# Patient Record
Sex: Female | Born: 1950 | Race: Black or African American | Hispanic: No | Marital: Single | Smoking: Never smoker
Health system: Southern US, Community
[De-identification: ages and names within clinical notes are randomized; demographics above are authoritative.]

## PROBLEM LIST (undated history)

## (undated) DIAGNOSIS — R011 Cardiac murmur, unspecified: Secondary | ICD-10-CM

## (undated) DIAGNOSIS — M1990 Unspecified osteoarthritis, unspecified site: Secondary | ICD-10-CM

## (undated) DIAGNOSIS — E785 Hyperlipidemia, unspecified: Secondary | ICD-10-CM

## (undated) DIAGNOSIS — K219 Gastro-esophageal reflux disease without esophagitis: Secondary | ICD-10-CM

## (undated) DIAGNOSIS — I10 Essential (primary) hypertension: Secondary | ICD-10-CM

## (undated) DIAGNOSIS — G959 Disease of spinal cord, unspecified: Secondary | ICD-10-CM

## (undated) DIAGNOSIS — E119 Type 2 diabetes mellitus without complications: Secondary | ICD-10-CM

## (undated) DIAGNOSIS — G4730 Sleep apnea, unspecified: Secondary | ICD-10-CM

## (undated) DIAGNOSIS — Z87442 Personal history of urinary calculi: Secondary | ICD-10-CM

## (undated) HISTORY — DX: Hyperlipidemia, unspecified: E78.5

## (undated) HISTORY — PX: CERVICAL CONE BIOPSY: SUR198

## (undated) HISTORY — DX: Essential (primary) hypertension: I10

## (undated) HISTORY — DX: Disease of spinal cord, unspecified: G95.9

## (undated) HISTORY — DX: Sleep apnea, unspecified: G47.30

## (undated) HISTORY — DX: Type 2 diabetes mellitus without complications: E11.9

---

## 2005-10-05 ENCOUNTER — Ambulatory Visit: Payer: Self-pay | Admitting: Cardiology

## 2005-10-25 ENCOUNTER — Ambulatory Visit: Payer: Self-pay | Admitting: Cardiology

## 2010-05-08 DIAGNOSIS — R072 Precordial pain: Secondary | ICD-10-CM

## 2010-09-30 ENCOUNTER — Other Ambulatory Visit: Payer: Self-pay | Admitting: Neurology

## 2010-09-30 DIAGNOSIS — M542 Cervicalgia: Secondary | ICD-10-CM

## 2010-10-07 ENCOUNTER — Ambulatory Visit (HOSPITAL_COMMUNITY)
Admission: RE | Admit: 2010-10-07 | Discharge: 2010-10-07 | Disposition: A | Discharge status: Home / Self Care | Payer: BC Managed Care – PPO | Source: Ambulatory Visit | Attending: Neurology | Admitting: Neurology

## 2010-10-07 DIAGNOSIS — M542 Cervicalgia: Secondary | ICD-10-CM

## 2010-10-07 DIAGNOSIS — M6281 Muscle weakness (generalized): Secondary | ICD-10-CM | POA: Insufficient documentation

## 2010-10-07 DIAGNOSIS — R209 Unspecified disturbances of skin sensation: Secondary | ICD-10-CM | POA: Insufficient documentation

## 2010-10-07 DIAGNOSIS — M5020 Other cervical disc displacement, unspecified cervical region: Secondary | ICD-10-CM | POA: Insufficient documentation

## 2013-12-19 ENCOUNTER — Encounter: Payer: BC Managed Care – PPO | Attending: "Endocrinology | Admitting: Nutrition

## 2013-12-19 ENCOUNTER — Encounter: Payer: Self-pay | Admitting: Nutrition

## 2013-12-19 VITALS — Ht 64.0 in | Wt 211.4 lb

## 2013-12-19 DIAGNOSIS — E1165 Type 2 diabetes mellitus with hyperglycemia: Secondary | ICD-10-CM

## 2013-12-19 DIAGNOSIS — E118 Type 2 diabetes mellitus with unspecified complications: Principal | ICD-10-CM

## 2013-12-19 DIAGNOSIS — IMO0002 Reserved for concepts with insufficient information to code with codable children: Secondary | ICD-10-CM

## 2013-12-19 NOTE — Progress Notes (Signed)
  Medical Nutrition Therapy:  Appt start time: 2542 end time:  1630.   Assessment:  Primary concerns today: DIabetes Type 2.. LIves by herself. 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 Doesn't eat but 1 meal per day usually. Doesn't exercise. On 30 units of Levemir and 100 mg of Invocana. Was on Metformin at one time but quit it due to GI upset. Willing to retry it now that she has been educated on taking it with food or after meal to reduce any GI side effect. Gets tired and doesn't have time to eat at times. A1C 7.9%.  Preferred Learning Style:    No preference indicated   Learning Readiness:    Ready  Change in progress   MEDICATIONs. See LIst   DIETARY INTAKE:  24-hr recall:  B ( AM): skips OR Sausage, scrambled egg biscuit Snk ( AM): none  L ( PM): chips, or soda Snk ( PM): none D ( PM): Usually stops at the [employer] and gets a meat and some vegetables/starches.. 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  Estimated energy needs: 1500 calories 170 g carbohydrates 112 g protein 42 g fat  Progress Towards Goal(s):  In progress.   Nutritional Diagnosis: Food and nutrition knowledge deficit related to diabetes as evidenced by A1C 7.9%    Intervention: INutrition counseling and diabetes education provided. Plan:  Aim for 2-3 Carb Choices per meal (30-45 grams) +/- 1 either way  No snacks between meals Cut out sodas Eat three balanced meals per day. Plan meals ahead and take 'plates' to work Include protein in moderation with your meals Consider  increasing your activity level by 30 minutes daily as tolerated Consider checking BG at fasting and 2 hours after supper daily and record on BS log.  Consider taking medication Levemir 30 units daily and Invokana as directed by MD Goal: Get A1C down below 7% in three months. 2. Do not skip meals.3. Lose  1 lb per week. 4. Cut out sodas.  Teaching Method Utilized:  Visual Auditory Hands on  Handouts given during visit include: The Plate Method The Meal Plan Card       Barriers to learning/adherence to lifestyle change: none  Demonstrated degree of understanding via:  Teach Back   Monitoring/Evaluation:  Dietary intake, exercise, meal planning, and body weight in 1 month(s).

## 2013-12-19 NOTE — Patient Instructions (Signed)
Plan:  Aim for 2-3 Carb Choices per meal (30-45 grams) +/- 1 either way  No snacks between meals Cut out sodas Eat three balanced meals per day. Plan meals ahead and take 'plates' to work Include protein in moderation with your meals Consider  increasing your activity level by 30 minutes daily as tolerated Consider checking BG at fasting and 2 hours after supper daily and record on BS log.  Consider taking medication Levemir 30 units daily and Invokana as directed by MD Goal: Get A1C down below 7% in three months. 2. Do not skip meals.3. Lose 1 lb per week. 4. Cut out sodas.

## 2014-02-04 ENCOUNTER — Encounter: Payer: Self-pay | Attending: "Endocrinology | Admitting: Nutrition

## 2014-02-04 VITALS — Ht 64.0 in | Wt 206.0 lb

## 2014-02-04 DIAGNOSIS — E118 Type 2 diabetes mellitus with unspecified complications: Principal | ICD-10-CM

## 2014-02-04 DIAGNOSIS — E1165 Type 2 diabetes mellitus with hyperglycemia: Secondary | ICD-10-CM

## 2014-02-04 DIAGNOSIS — IMO0002 Reserved for concepts with insufficient information to code with codable children: Secondary | ICD-10-CM

## 2014-02-04 NOTE — Progress Notes (Signed)
  Medical Nutrition Therapy:  Appt start time: 1600 end time:  1630.  Assessment:  Primary concerns today: DIabetes Type 2..Follow up. Taking levemir 30 units and Crestor at night. Lost 5 lbs  Since last visit. Working on trying to eat better balanced meals. Didn't write down blood sugars. BS at night was 110 mg/dl FBS 165 mg/dl.   Preferred Learning Style:    No preference indicated   Learning Readiness:    Ready  Change in progress  MEDICATIONs. 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  Estimated energy needs: 1500 calories 170 g carbohydrates 112 g protein 42 g fat  Progress Towards Goal(s):  In progress.   Nutritional Diagnosis: Food and nutrition knowledge deficit related to diabetes as evidenced by A1C 7.9%    Intervention: Nutrition counseling and diabetes education provided. Reviewed meal planning, target ranges for blood sugars and benefits of weight loss on insulin resistance.  Plan:  Aim for 2-3 Carb Choices per meal (30-45 grams) +/- 1 either way  No snacks between meals Cut out sodas Eat three balanced meals per day. Plan meals ahead and take 'plates' to work Include protein in moderation with your meals Consider  increasing your activity level by 30 minutes daily as tolerated Keep a food journal Consider checking BG at fasting and 2 hours after supper daily and record on BS log.  Increase physical activity to 30 minutes a day. Goal: Get A1C down below 7% in three months. 2. Do not skip meals.3. Lose 1 lb per week. 4. Cut out sodas.  Teaching Method Utilized:  Visual Auditory Hands on  Handouts given during visit include: The Plate Method The  Meal Plan Card       Barriers to learning/adherence to lifestyle change: none  Demonstrated degree of understanding via:  Teach Back   Monitoring/Evaluation:  Dietary intake, exercise, meal planning, and body weight in 1 month(s).

## 2014-02-04 NOTE — Patient Instructions (Signed)
Plan:  Aim for 2-3 Carb Choices per meal (30-45 grams) +/- 1 either way  No snacks between meals Cut out sodas Eat three balanced meals per day. Plan meals ahead and take 'plates' to work Include protein in moderation with your meals Consider  increasing your activity level by 30 minutes daily as tolerated Keep a food journal Consider checking BG at fasting and 2 hours after supper daily and record on BS log.  Increase physical activity to 30 minutes a day. Goal: Get A1C down below 7% in three months. 2. Do not skip meals.3. Lose 1 lb per week. 4. Use resistance band for 5 minutes a day and walk in place while watching commercials. Marland Kitchen

## 2014-03-11 ENCOUNTER — Encounter: Payer: Self-pay | Attending: "Endocrinology | Admitting: Nutrition

## 2014-07-05 ENCOUNTER — Ambulatory Visit (INDEPENDENT_AMBULATORY_CARE_PROVIDER_SITE_OTHER): Payer: BLUE CROSS/BLUE SHIELD | Admitting: Urology

## 2014-07-05 DIAGNOSIS — N3941 Urge incontinence: Secondary | ICD-10-CM

## 2014-07-05 DIAGNOSIS — N200 Calculus of kidney: Secondary | ICD-10-CM

## 2014-07-17 LAB — HEMOGLOBIN A1C: Hgb A1c MFr Bld: 8.1 % — AB (ref 4.0–6.0)

## 2014-09-13 ENCOUNTER — Ambulatory Visit (INDEPENDENT_AMBULATORY_CARE_PROVIDER_SITE_OTHER): Payer: BLUE CROSS/BLUE SHIELD | Admitting: Urology

## 2014-09-13 DIAGNOSIS — R3915 Urgency of urination: Secondary | ICD-10-CM | POA: Diagnosis not present

## 2014-09-13 DIAGNOSIS — N200 Calculus of kidney: Secondary | ICD-10-CM

## 2014-09-13 DIAGNOSIS — N3941 Urge incontinence: Secondary | ICD-10-CM | POA: Diagnosis not present

## 2014-10-11 ENCOUNTER — Encounter: Payer: Self-pay | Admitting: "Endocrinology

## 2014-10-25 ENCOUNTER — Ambulatory Visit: Payer: Self-pay | Admitting: "Endocrinology

## 2014-10-26 ENCOUNTER — Other Ambulatory Visit: Payer: Self-pay | Admitting: "Endocrinology

## 2014-10-28 ENCOUNTER — Ambulatory Visit (INDEPENDENT_AMBULATORY_CARE_PROVIDER_SITE_OTHER): Payer: BLUE CROSS/BLUE SHIELD | Admitting: "Endocrinology

## 2014-10-28 ENCOUNTER — Encounter: Payer: Self-pay | Admitting: "Endocrinology

## 2014-10-28 VITALS — BP 115/76 | HR 81 | Ht 64.0 in | Wt 200.0 lb

## 2014-10-28 DIAGNOSIS — E785 Hyperlipidemia, unspecified: Secondary | ICD-10-CM

## 2014-10-28 DIAGNOSIS — I10 Essential (primary) hypertension: Secondary | ICD-10-CM | POA: Insufficient documentation

## 2014-10-28 DIAGNOSIS — Z794 Long term (current) use of insulin: Secondary | ICD-10-CM

## 2014-10-28 DIAGNOSIS — E1165 Type 2 diabetes mellitus with hyperglycemia: Secondary | ICD-10-CM

## 2014-10-28 DIAGNOSIS — E782 Mixed hyperlipidemia: Secondary | ICD-10-CM | POA: Insufficient documentation

## 2014-10-28 DIAGNOSIS — IMO0001 Reserved for inherently not codable concepts without codable children: Secondary | ICD-10-CM

## 2014-10-28 MED ORDER — CANAGLIFLOZIN 100 MG PO TABS: 100.0000 mg | ORAL_TABLET | Freq: Every day | ORAL | Status: DC

## 2014-10-28 NOTE — Progress Notes (Signed)
Subjective:    Patient ID: Rebecca Roberson, female    DOB: [birth year],    Past Medical History  Diagnosis Date  . Diabetes mellitus without complication ([place])   . Hypertension   . Hyperlipidemia   . Sleep apnea    Past Surgical History  Procedure Laterality Date  . Cervical cone biopsy     Social History   Social History  . Marital Status: Single    Spouse Name: N/A  . Number of Children: N/A  . Years of Education: N/A   Social History Main Topics  . Smoking status: Never Smoker   . Smokeless tobacco: None  . Alcohol Use: No  . Drug Use: No  . Sexual Activity: Not Asked   Other Topics Concern  . None   Social History Narrative   Outpatient Encounter Prescriptions as of 10/28/2014  Medication Sig  . canagliflozin (INVOKANA) 100 MG TABS tablet Take 1 tablet (100 mg total) by mouth daily.  . cyclobenzaprine (FLEXERIL) 10 MG tablet Take 10 mg by mouth 3 (three) times daily as needed for muscle spasms.  Marland Kitchen gabapentin (NEURONTIN) 300 MG capsule Take 300 mg by mouth 3 (three) times daily.  Marland Kitchen ibuprofen (ADVIL,MOTRIN) 800 MG tablet Take 800 mg by mouth every 8 (eight) hours as needed.  . Insulin Detemir (LEVEMIR FLEXPEN [place]) Inject 30 Units into the skin.  Marland Kitchen lisinopril-hydrochlorothiazide (PRINZIDE,ZESTORETIC) 20-25 MG per tablet Take 1 tablet by mouth daily.  Marland Kitchen omeprazole (PRILOSEC) 20 MG capsule Take 20 mg by mouth daily.  Marland Kitchen PARoxetine (PAXIL) 20 MG tablet Take 20 mg by mouth daily.  . potassium chloride (KLOR-CON) 20 MEQ packet Take by mouth 2 (two) times daily.  . rosuvastatin (CRESTOR) 10 MG tablet Take 10 mg by mouth daily.  Marland Kitchen tolterodine (DETROL LA) 4 MG 24 hr capsule Take 4 mg by mouth daily.  . traMADol (ULTRAM) 50 MG tablet Take by mouth every 6 (six) hours as needed.  . Vitamin D, Ergocalciferol, (DRISDOL) 50000 UNITS CAPS capsule Take 50,000 Units by mouth every 7 (seven) days.  . [DISCONTINUED] INVOKANA 100 MG TABS tablet TAKE ONE TABLET BY MOUTH DAILY   No  facility-administered encounter medications on file as of 10/28/2014.   ALLERGIES: Allergies  Allergen Reactions  . Sulfa Antibiotics    VACCINATION STATUS:  There is no immunization history on file for this patient.  Diabetes She presents for her follow-up diabetic visit. She has type 2 diabetes mellitus. Onset time: Diagnosed at approximate age of 77 yrs. Her disease course has been stable. There are no hypoglycemic associated symptoms. Pertinent negatives for hypoglycemia include no confusion, headaches, pallor or seizures. There are no diabetic associated symptoms. Pertinent negatives for diabetes include no chest pain and no polyuria. Symptoms are improving. There are no diabetic complications. Risk factors for coronary artery disease include dyslipidemia, hypertension and sedentary lifestyle. Her weight is stable. Diabetic meal planning: Admits to dietary indiscretion. She drinks soda regularly. Her overall blood glucose range is 180-200 mg/dl.  Hypertension This is a chronic problem. The current episode started more than 1 year ago. Pertinent negatives include no chest pain, headaches, palpitations or shortness of breath.  Hyperlipidemia This is a chronic problem. The current episode started more than 1 year ago. Pertinent negatives include no chest pain, myalgias or shortness of breath.     Review of Systems  Constitutional: Negative for unexpected weight change.  HENT: Negative for trouble swallowing and voice change.   Eyes: Negative for visual disturbance.  Respiratory: Negative for cough, shortness of breath and wheezing.   Cardiovascular: Negative for chest pain, palpitations and leg swelling.  Gastrointestinal: Negative for nausea, vomiting and diarrhea.  Endocrine: Negative for cold intolerance, heat intolerance and polyuria.  Musculoskeletal: Negative for myalgias and arthralgias.  Skin: Negative for color change, pallor, rash and wound.  Neurological: Negative for  seizures and headaches.  Psychiatric/Behavioral: Negative for suicidal ideas and confusion.    Objective:    BP 115/76 mmHg  Pulse 81  Ht 5\' 4"  (1.626 m)  Wt 200 lb (90.719 kg)  BMI 34.31 kg/m2  SpO2 95%  Wt Readings from Last 3 Encounters:  10/28/14 200 lb (90.719 kg)  10/11/14 200 lb (90.719 kg)  02/04/14 206 lb (93.441 kg)    Physical Exam  Constitutional: She is oriented to person, place, and time. She appears well-developed.  HENT:  Head: Normocephalic and atraumatic.  Eyes: EOM are normal.  Neck: Normal range of motion. Neck supple. No tracheal deviation present. No thyromegaly present.  Cardiovascular: Normal rate and regular rhythm.   Pulmonary/Chest: Effort normal and breath sounds normal.  Abdominal: Soft. Bowel sounds are normal. There is no tenderness. There is no guarding.  Musculoskeletal: Normal range of motion. She exhibits no edema.  Neurological: She is alert and oriented to person, place, and time. She has normal reflexes. No cranial nerve deficit. Coordination normal.  Skin: Skin is warm and dry. No rash noted. No erythema. No pallor.  Psychiatric: She has a normal mood and affect. Judgment normal.    Results for orders placed or performed in visit on 10/11/14  Hemoglobin A1c  Result Value Ref Range   Hgb A1c MFr Bld 8.1 (A) 4.0 - 6.0 %   Complete Blood Count (Most recent): No results found for: WBC, HGB, HCT, MCV, PLT Chemistry (most recent): No results found for: NA, K, CL, CO2, BUN, CREATININE, GLUF Diabetic Labs (most recent): Lab Results  Component Value Date   HGBA1C 8.1* 07/17/2014   Lipid profile (most recent): No results found for: TRIG, CHOL       Assessment & Plan:   1. Uncontrolled type 2 diabetes mellitus without complication, with long-term current use of insulin ([place])  Patient came with stable mood but above target glucose profile, and  recent A1c of 8 %. Recent labs reviewed. Patient remains at a high risk for more acute and  chronic complications of diabetes which include CAD, CVA, CKD, retinopathy, and neuropathy. These are all discussed in detail with the patient. I have re-counseled the patient on diet management and weight loss, by adopting a carbohydrate restricted / protein rich  Diet. Patient is advised to stick to a routine mealtimes to eat 3 meals  a day and avoid unnecessary snacks ( to snack only to correct hypoglycemia). Patient is advised to eliminate simple carbs  from their diet including cakes desserts ice cream soda (  diet and regular) , sweet tea , Candies,  chips and cookies, artificial sweeteners,   and "sugar-free" products .  This will help patient to have stable blood glucose profile and potentially lose weight.  The patient  will be  scheduled with Jearld Fenton, RDN, CDE for individualized DM education. I have approached patient to continue on  intensive monitoring of blood glucose and insulin therapy, and patient agrees.  I will increase her basal insulin Levemir to 40 units QHS, associated with strict monitoring of glucose  every morning. - Patient is warned not to take insulin without proper monitoring  per orders.  -Patient is encouraged to call clinic for blood glucose levels less than 70 or above 300 mg /dl. -She is known to have intolerance to metformin -I advised her to continue interval, 100 mg by mouth every day. Side effects and precautions discussed with her. -Target numbers for A1c, LDL, HDL, Triglycerides, Waist Circumference were discussed in detail.  2) HTN: Controlled. Continue current medications including ACEI lisinopril 20 mg.Marland Kitchen 3) HPL: LDL at above target levels, continue statins Crestor 21 mg.. 4)  Weight/Diet: CDE consult, exercise, and carbs information provided.  5) Chronic Care: -Patient is on ACEI and Statin medications and encouraged to continue to follow up with Ophthalmology, Podiatrist at least yearly or according to recommendations, and advised to stay away  from smoking. I have recommended yearly flu vaccine and pneumonia vaccination at least every 5 years; moderate intensity exercise for up to 150 minutes weekly; and  sleep for at least 7 hours a day.  Patient to bring meter and  blood glucose logs during their next visit.  Follow up plan: Return in about 3 months (around 01/28/2015) for diabetes, high blood pressure, high cholesterol, follow up with meter and logs- no labs.  Glade Lloyd, MD Phone: [phone]  Fax: [phone]   10/28/2014, 4:54 PM

## 2014-11-07 ENCOUNTER — Other Ambulatory Visit: Payer: Self-pay | Admitting: "Endocrinology

## 2014-11-23 ENCOUNTER — Other Ambulatory Visit: Payer: Self-pay | Admitting: "Endocrinology

## 2015-01-21 ENCOUNTER — Other Ambulatory Visit: Payer: Self-pay | Admitting: "Endocrinology

## 2015-02-10 ENCOUNTER — Ambulatory Visit: Payer: BLUE CROSS/BLUE SHIELD | Admitting: "Endocrinology

## 2015-04-10 ENCOUNTER — Other Ambulatory Visit: Payer: Self-pay | Admitting: "Endocrinology

## 2015-04-11 LAB — BASIC METABOLIC PANEL
BUN: 18 mg/dL (ref 7–25)
CALCIUM: 8.9 mg/dL (ref 8.6–10.4)
CHLORIDE: 101 mmol/L (ref 98–110)
CO2: 28 mmol/L (ref 20–31)
Creat: 0.61 mg/dL (ref 0.50–0.99)
GLUCOSE: 147 mg/dL — AB (ref 65–99)
POTASSIUM: 3.5 mmol/L (ref 3.5–5.3)
SODIUM: 138 mmol/L (ref 135–146)

## 2015-04-11 LAB — HEMOGLOBIN A1C
Hgb A1c MFr Bld: 8.2 % — ABNORMAL HIGH (ref <5.7)
Mean Plasma Glucose: 189 mg/dL — ABNORMAL HIGH (ref <117)

## 2015-04-17 ENCOUNTER — Other Ambulatory Visit: Payer: Self-pay | Admitting: "Endocrinology

## 2015-04-17 ENCOUNTER — Encounter: Payer: Self-pay | Admitting: "Endocrinology

## 2015-04-17 ENCOUNTER — Ambulatory Visit (INDEPENDENT_AMBULATORY_CARE_PROVIDER_SITE_OTHER): Payer: BLUE CROSS/BLUE SHIELD | Admitting: "Endocrinology

## 2015-04-17 ENCOUNTER — Other Ambulatory Visit: Payer: Self-pay

## 2015-04-17 VITALS — BP 118/80 | HR 84 | Resp 18 | Ht 64.0 in | Wt 200.0 lb

## 2015-04-17 DIAGNOSIS — Z794 Long term (current) use of insulin: Secondary | ICD-10-CM

## 2015-04-17 DIAGNOSIS — E1165 Type 2 diabetes mellitus with hyperglycemia: Secondary | ICD-10-CM

## 2015-04-17 DIAGNOSIS — E559 Vitamin D deficiency, unspecified: Secondary | ICD-10-CM | POA: Diagnosis not present

## 2015-04-17 DIAGNOSIS — E785 Hyperlipidemia, unspecified: Secondary | ICD-10-CM

## 2015-04-17 DIAGNOSIS — Z6834 Body mass index (BMI) 34.0-34.9, adult: Secondary | ICD-10-CM

## 2015-04-17 DIAGNOSIS — I10 Essential (primary) hypertension: Secondary | ICD-10-CM

## 2015-04-17 DIAGNOSIS — IMO0001 Reserved for inherently not codable concepts without codable children: Secondary | ICD-10-CM

## 2015-04-17 DIAGNOSIS — E6609 Other obesity due to excess calories: Secondary | ICD-10-CM | POA: Insufficient documentation

## 2015-04-17 MED ORDER — GLUCOSE BLOOD VI STRP
ORAL_STRIP | Status: DC
Start: 2015-04-17 — End: 2020-09-24

## 2015-04-17 NOTE — Patient Instructions (Signed)

## 2015-04-17 NOTE — Progress Notes (Signed)
Subjective:    Patient ID: Rebecca Roberson, female    DOB: Nov 13, 1950,    Past Medical History  Diagnosis Date  . Diabetes mellitus without complication ([place])   . Hypertension   . Hyperlipidemia   . Sleep apnea    Past Surgical History  Procedure Laterality Date  . Cervical cone biopsy     Social History   Social History  . Marital Status: Single    Spouse Name: N/A  . Number of Children: N/A  . Years of Education: N/A   Social History Main Topics  . Smoking status: Never Smoker   . Smokeless tobacco: None  . Alcohol Use: No  . Drug Use: No  . Sexual Activity: Not Asked   Other Topics Concern  . None   Social History Narrative   Outpatient Encounter Prescriptions as of 04/17/2015  Medication Sig  . cyclobenzaprine (FLEXERIL) 10 MG tablet Take 10 mg by mouth 3 (three) times daily as needed for muscle spasms.  Marland Kitchen gabapentin (NEURONTIN) 300 MG capsule Take 300 mg by mouth 3 (three) times daily.  Marland Kitchen ibuprofen (ADVIL,MOTRIN) 800 MG tablet Take 800 mg by mouth every 8 (eight) hours as needed.  . Insulin Detemir (LEVEMIR FLEXPEN [place]) Inject 40 Units into the skin.  . INVOKANA 100 MG TABS tablet TAKE ONE TABLET BY MOUTH DAILY  . lisinopril-hydrochlorothiazide (PRINZIDE,ZESTORETIC) 20-25 MG per tablet Take 1 tablet by mouth daily.  Marland Kitchen LITETOUCH PEN NEEDLES 31G X 8 MM MISC USE FOUR TIMES DAILY  . omeprazole (PRILOSEC) 20 MG capsule Take 20 mg by mouth daily.  Marland Kitchen PARoxetine (PAXIL) 20 MG tablet Take 20 mg by mouth daily.  . potassium chloride (KLOR-CON) 20 MEQ packet Take by mouth 2 (two) times daily.  . rosuvastatin (CRESTOR) 10 MG tablet TAKE ONE TABLET BY MOUTH ONCE DAILY  . tolterodine (DETROL LA) 4 MG 24 hr capsule Take 4 mg by mouth daily.  . traMADol (ULTRAM) 50 MG tablet Take by mouth every 6 (six) hours as needed.  . Vitamin D, Ergocalciferol, (DRISDOL) 50000 UNITS CAPS capsule Take 50,000 Units by mouth every 7 (seven) days.   No facility-administered encounter  medications on file as of 04/17/2015.   ALLERGIES: Allergies  Allergen Reactions  . Sulfa Antibiotics    VACCINATION STATUS:  There is no immunization history on file for this patient.  Diabetes She presents for her follow-up diabetic visit. She has type 2 diabetes mellitus. Onset time: Diagnosed at approximate age of 39 yrs. Her disease course has been stable. There are no hypoglycemic associated symptoms. Pertinent negatives for hypoglycemia include no confusion, headaches, pallor or seizures. There are no diabetic associated symptoms. Pertinent negatives for diabetes include no chest pain and no polyuria. Symptoms are stable. There are no diabetic complications. Risk factors for coronary artery disease include dyslipidemia, hypertension and sedentary lifestyle. Her weight is stable. Diabetic meal planning: Admits to dietary indiscretion. She drinks soda regularly. Home blood sugar record trend: She did not bring the meter nor log to review today. Her overall blood glucose range is 180-200 mg/dl.  Hypertension This is a chronic problem. The current episode started more than 1 year ago. Pertinent negatives include no chest pain, headaches, palpitations or shortness of breath.  Hyperlipidemia This is a chronic problem. The current episode started more than 1 year ago. Pertinent negatives include no chest pain, myalgias or shortness of breath.     Review of Systems  Constitutional: Negative for unexpected weight change.  HENT: Negative  for trouble swallowing and voice change.   Eyes: Negative for visual disturbance.  Respiratory: Negative for cough, shortness of breath and wheezing.   Cardiovascular: Negative for chest pain, palpitations and leg swelling.  Gastrointestinal: Negative for nausea, vomiting and diarrhea.  Endocrine: Negative for cold intolerance, heat intolerance and polyuria.  Musculoskeletal: Negative for myalgias and arthralgias.  Skin: Negative for color change, pallor,  rash and wound.  Neurological: Negative for seizures and headaches.  Psychiatric/Behavioral: Negative for suicidal ideas and confusion.    Objective:    BP 118/80 mmHg  Pulse 84  Resp 18  Ht 5\' 4"  (1.626 m)  Wt 200 lb (90.719 kg)  BMI 34.31 kg/m2  SpO2 97%  Wt Readings from Last 3 Encounters:  04/17/15 200 lb (90.719 kg)  10/28/14 200 lb (90.719 kg)  10/11/14 200 lb (90.719 kg)    Physical Exam  Constitutional: She is oriented to person, place, and time. She appears well-developed.  HENT:  Head: Normocephalic and atraumatic.  Eyes: EOM are normal.  Neck: Normal range of motion. Neck supple. No tracheal deviation present. No thyromegaly present.  Cardiovascular: Normal rate and regular rhythm.   Pulmonary/Chest: Effort normal and breath sounds normal.  Abdominal: Soft. Bowel sounds are normal. There is no tenderness. There is no guarding.  Musculoskeletal: Normal range of motion. She exhibits no edema.  Neurological: She is alert and oriented to person, place, and time. She has normal reflexes. No cranial nerve deficit. Coordination normal.  Skin: Skin is warm and dry. No rash noted. No erythema. No pallor.  Psychiatric: She has a normal mood and affect. Judgment normal.    Results for orders placed or performed in visit on [id number]  Basic metabolic panel  Result Value Ref Range   Sodium 138 135 - 146 mmol/L   Potassium 3.5 3.5 - 5.3 mmol/L   Chloride 101 98 - 110 mmol/L   CO2 28 20 - 31 mmol/L   Glucose, Bld 147 (H) 65 - 99 mg/dL   BUN 18 7 - 25 mg/dL   Creat 0.61 0.50 - 0.99 mg/dL   Calcium 8.9 8.6 - 10.4 mg/dL  Hemoglobin A1c  Result Value Ref Range   Hgb A1c MFr Bld 8.2 (H) <5.7 %   Mean Plasma Glucose 189 (H) <117 mg/dL   Complete Blood Count (Most recent): No results found for: WBC, HGB, HCT, MCV, PLT Chemistry (most recent): Lab Results  Component Value Date   NA 138 04/10/2015   K 3.5 04/10/2015   CL 101 04/10/2015   CO2 28 04/10/2015   BUN 18  04/10/2015   CREATININE 0.61 04/10/2015   Diabetic Labs (most recent): Lab Results  Component Value Date   HGBA1C 8.2* 04/10/2015   HGBA1C 8.1* 07/17/2014   Lipid profile (most recent): No results found for: TRIG, CHOL       Assessment & Plan:   1. Uncontrolled type 2 diabetes mellitus without complication, with long-term current use of insulin ([place])  Patient came with stable mood but above target glucose profile, and  recent A1c  stays the same at 8.2%.  Recent labs reviewed. Patient remains at a high risk for more acute and chronic complications of diabetes which include CAD, CVA, CKD, retinopathy, and neuropathy. These are all discussed in detail with the patient. I have re-counseled the patient on diet management and weight loss, by adopting a carbohydrate restricted / protein rich  Diet. Patient is advised to stick to a routine mealtimes to eat 3 meals  a day and avoid unnecessary snacks ( to snack only to correct hypoglycemia). Patient is advised to eliminate simple carbs  from their diet including cakes desserts ice cream soda (  diet and regular) , sweet tea , Candies,  chips and cookies, artificial sweeteners,   and "sugar-free" products .  This will help patient to have stable blood glucose profile and potentially lose weight.  The patient  will be  scheduled with Jearld Fenton, RDN, CDE for individualized DM education. I have approached patient to continue on  intensive monitoring of blood glucose and insulin therapy, and patient agrees.  I will increase her basal insulin Levemir to 40 units QHS, associated with strict monitoring of glucose  every morning. - Patient is warned not to take insulin without proper monitoring per orders.  -Patient is encouraged to call clinic for blood glucose levels less than 70 or above 200 mg /dl. -She is known to have intolerance to metformin -I advised her to continue interval, 100 mg by mouth every day. Side effects and precautions  discussed with her. -Target numbers for A1c, LDL, HDL, Triglycerides, Waist Circumference were discussed in detail.  2) HTN: Controlled. Continue current medications including ACEI lisinopril 20 mg.Marland Kitchen 3) HPL: LDL at above target levels, continue statins Crestor 21 mg.. 4)  Weight/Diet: CDE consult, exercise, and carbs information provided.  5) Chronic Care: -Patient is on ACEI and Statin medications and encouraged to continue to follow up with Ophthalmology, Podiatrist at least yearly or according to recommendations, and advised to stay away from smoking. I have recommended yearly flu vaccine and pneumonia vaccination at least every 5 years; moderate intensity exercise for up to 150 minutes weekly; and  sleep for at least 7 hours a day.  Patient to bring meter and  blood glucose logs during their next visit.  Follow up plan: Return in about 3 months (around 07/18/2015) for diabetes, high blood pressure, high cholesterol, Vitamin D deficiency, follow up with pre-visit labs, meter, and logs.  Glade Lloyd, MD Phone: [phone]  Fax: [phone]   04/17/2015, 4:18 PM

## 2015-05-27 ENCOUNTER — Other Ambulatory Visit: Payer: Self-pay | Admitting: "Endocrinology

## 2015-07-23 ENCOUNTER — Ambulatory Visit: Payer: BLUE CROSS/BLUE SHIELD | Admitting: "Endocrinology

## 2015-09-19 ENCOUNTER — Other Ambulatory Visit: Payer: Self-pay | Admitting: Urology

## 2015-09-19 ENCOUNTER — Ambulatory Visit (HOSPITAL_COMMUNITY)
Admission: RE | Admit: 2015-09-19 | Discharge: 2015-09-19 | Disposition: A | Discharge status: Home / Self Care | Payer: BLUE CROSS/BLUE SHIELD | Source: Ambulatory Visit | Attending: Urology | Admitting: Urology

## 2015-09-19 ENCOUNTER — Ambulatory Visit (INDEPENDENT_AMBULATORY_CARE_PROVIDER_SITE_OTHER): Payer: BLUE CROSS/BLUE SHIELD | Admitting: Urology

## 2015-09-19 DIAGNOSIS — N200 Calculus of kidney: Secondary | ICD-10-CM

## 2015-09-19 DIAGNOSIS — N3941 Urge incontinence: Secondary | ICD-10-CM | POA: Diagnosis not present

## 2015-10-07 ENCOUNTER — Other Ambulatory Visit: Payer: Self-pay | Admitting: "Endocrinology

## 2015-10-08 LAB — BASIC METABOLIC PANEL
BUN: 17 mg/dL (ref 7–25)
CO2: 31 mmol/L (ref 20–31)
Calcium: 8.9 mg/dL (ref 8.6–10.4)
Chloride: 99 mmol/L (ref 98–110)
Creat: 0.98 mg/dL (ref 0.50–0.99)
GLUCOSE: 121 mg/dL — AB (ref 65–99)
POTASSIUM: 3.1 mmol/L — AB (ref 3.5–5.3)
SODIUM: 136 mmol/L (ref 135–146)

## 2015-10-08 LAB — HEMOGLOBIN A1C
Hgb A1c MFr Bld: 7.7 % — ABNORMAL HIGH (ref <5.7)
Mean Plasma Glucose: 174 mg/dL

## 2015-10-16 ENCOUNTER — Encounter: Payer: Self-pay | Admitting: "Endocrinology

## 2015-10-16 ENCOUNTER — Ambulatory Visit (INDEPENDENT_AMBULATORY_CARE_PROVIDER_SITE_OTHER): Payer: BLUE CROSS/BLUE SHIELD | Admitting: "Endocrinology

## 2015-10-16 VITALS — BP 108/68 | HR 91 | Ht 64.0 in | Wt 200.0 lb

## 2015-10-16 DIAGNOSIS — I10 Essential (primary) hypertension: Secondary | ICD-10-CM

## 2015-10-16 DIAGNOSIS — Z794 Long term (current) use of insulin: Secondary | ICD-10-CM | POA: Diagnosis not present

## 2015-10-16 DIAGNOSIS — E1165 Type 2 diabetes mellitus with hyperglycemia: Secondary | ICD-10-CM

## 2015-10-16 DIAGNOSIS — E785 Hyperlipidemia, unspecified: Secondary | ICD-10-CM

## 2015-10-16 DIAGNOSIS — IMO0001 Reserved for inherently not codable concepts without codable children: Secondary | ICD-10-CM

## 2015-10-16 NOTE — Progress Notes (Signed)
Subjective:    Patient ID: Rebecca Roberson, female    DOB: [birth year],    Past Medical History:  Diagnosis Date  . Diabetes mellitus without complication ([place])   . Hyperlipidemia   . Hypertension   . Sleep apnea    Past Surgical History:  Procedure Laterality Date  . CERVICAL CONE BIOPSY     Social History   Social History  . Marital status: Single    Spouse name: N/A  . Number of children: N/A  . Years of education: N/A   Social History Main Topics  . Smoking status: Never Smoker  . Smokeless tobacco: Never Used  . Alcohol use No  . Drug use: No  . Sexual activity: Not Asked   Other Topics Concern  . None   Social History Narrative  . None   Outpatient Encounter Prescriptions as of 10/16/2015  Medication Sig  . cyclobenzaprine (FLEXERIL) 10 MG tablet Take 10 mg by mouth 3 (three) times daily as needed for muscle spasms.  Marland Kitchen gabapentin (NEURONTIN) 300 MG capsule Take 300 mg by mouth 3 (three) times daily.  Marland Kitchen glucose blood (ONETOUCH VERIO) test strip USE AS DIRECTED FOUR TIMES DAILY  . ibuprofen (ADVIL,MOTRIN) 800 MG tablet Take 800 mg by mouth every 8 (eight) hours as needed.  . Insulin Detemir (LEVEMIR FLEXPEN [place]) Inject 40 Units into the skin.  . INVOKANA 100 MG TABS tablet TAKE ONE TABLET BY MOUTH DAILY  . lisinopril-hydrochlorothiazide (PRINZIDE,ZESTORETIC) 20-25 MG per tablet Take 1 tablet by mouth daily.  Marland Kitchen LITETOUCH PEN NEEDLES 31G X 8 MM MISC USE FOUR TIMES DAILY  . omeprazole (PRILOSEC) 20 MG capsule Take 20 mg by mouth daily.  Marland Kitchen PARoxetine (PAXIL) 20 MG tablet Take 20 mg by mouth daily.  . potassium chloride (KLOR-CON) 20 MEQ packet Take by mouth 2 (two) times daily.  . rosuvastatin (CRESTOR) 10 MG tablet TAKE ONE TABLET BY MOUTH ONCE DAILY  . tolterodine (DETROL LA) 4 MG 24 hr capsule Take 4 mg by mouth daily.  . traMADol (ULTRAM) 50 MG tablet Take by mouth every 6 (six) hours as needed.  . Vitamin D, Ergocalciferol, (DRISDOL) 50000 UNITS CAPS capsule  Take 50,000 Units by mouth every 7 (seven) days.   No facility-administered encounter medications on file as of 10/16/2015.    ALLERGIES: Allergies  Allergen Reactions  . Sulfa Antibiotics    VACCINATION STATUS:  There is no immunization history on file for this patient.  Diabetes  She presents for her follow-up diabetic visit. She has type 2 diabetes mellitus. Onset time: Diagnosed at approximate age of 35 yrs. Her disease course has been improving. There are no hypoglycemic associated symptoms. Pertinent negatives for hypoglycemia include no confusion, headaches, pallor or seizures. There are no diabetic associated symptoms. Pertinent negatives for diabetes include no chest pain and no polyuria. Symptoms are improving. There are no diabetic complications. Risk factors for coronary artery disease include dyslipidemia, hypertension and sedentary lifestyle. Her weight is stable. She is following a generally unhealthy diet. Diabetic meal planning: Admits to dietary indiscretion. She drinks soda regularly. Home blood sugar record trend: She did not bring the meter nor log to review today.  Hypertension  This is a chronic problem. The current episode started more than 1 year ago. Pertinent negatives include no chest pain, headaches, palpitations or shortness of breath.  Hyperlipidemia  This is a chronic problem. The current episode started more than 1 year ago. Pertinent negatives include no chest pain, myalgias or  shortness of breath.     Review of Systems  Constitutional: Negative for unexpected weight change.  HENT: Negative for trouble swallowing and voice change.   Eyes: Negative for visual disturbance.  Respiratory: Negative for cough, shortness of breath and wheezing.   Cardiovascular: Negative for chest pain, palpitations and leg swelling.  Gastrointestinal: Negative for diarrhea, nausea and vomiting.  Endocrine: Negative for cold intolerance, heat intolerance and polyuria.   Musculoskeletal: Negative for arthralgias and myalgias.  Skin: Negative for color change, pallor, rash and wound.  Neurological: Negative for seizures and headaches.  Psychiatric/Behavioral: Negative for confusion and suicidal ideas.    Objective:    BP 108/68   Pulse 91   Ht 5\' 4"  (1.626 m)   Wt 200 lb (90.7 kg)   BMI 34.33 kg/m   Wt Readings from Last 3 Encounters:  10/16/15 200 lb (90.7 kg)  04/17/15 200 lb (90.7 kg)  10/28/14 200 lb (90.7 kg)    Physical Exam  Constitutional: She is oriented to person, place, and time. She appears well-developed.  HENT:  Head: Normocephalic and atraumatic.  Eyes: EOM are normal.  Neck: Normal range of motion. Neck supple. No tracheal deviation present. No thyromegaly present.  Cardiovascular: Normal rate and regular rhythm.   Pulmonary/Chest: Effort normal and breath sounds normal.  Abdominal: Soft. Bowel sounds are normal. There is no tenderness. There is no guarding.  Musculoskeletal: Normal range of motion. She exhibits no edema.  Neurological: She is alert and oriented to person, place, and time. She has normal reflexes. No cranial nerve deficit. Coordination normal.  Skin: Skin is warm and dry. No rash noted. No erythema. No pallor.  Psychiatric: She has a normal mood and affect. Judgment normal.    Results for orders placed or performed in visit on [id number]  Basic metabolic panel  Result Value Ref Range   Sodium 136 135 - 146 mmol/L   Potassium 3.1 (L) 3.5 - 5.3 mmol/L   Chloride 99 98 - 110 mmol/L   CO2 31 20 - 31 mmol/L   Glucose, Bld 121 (H) 65 - 99 mg/dL   BUN 17 7 - 25 mg/dL   Creat 0.98 0.50 - 0.99 mg/dL   Calcium 8.9 8.6 - 10.4 mg/dL  Hemoglobin A1c  Result Value Ref Range   Hgb A1c MFr Bld 7.7 (H) <5.7 %   Mean Plasma Glucose 174 mg/dL   Complete Blood Count (Most recent): No results found for: WBC, HGB, HCT, MCV, PLT Chemistry (most recent): Lab Results  Component Value Date   NA 136 10/07/2015   K 3.1 (L)  10/07/2015   CL 99 10/07/2015   CO2 31 10/07/2015   BUN 17 10/07/2015   CREATININE 0.98 10/07/2015   Diabetic Labs (most recent): Lab Results  Component Value Date   HGBA1C 7.7 (H) 10/07/2015   HGBA1C 8.2 (H) 04/10/2015   HGBA1C 8.1 (A) 07/17/2014   Lipid profile (most recent): No results found for: TRIG, CHOL       Assessment & Plan:   1. Uncontrolled type 2 diabetes mellitus without complication, with long-term current use of insulin [facility])  Patient Once again came without her meter nor logs. Her A1c has improved to 7.7% from 8.2%.  Recent labs reviewed. Patient remains at a high risk for more acute and chronic complications of diabetes which include CAD, CVA, CKD, retinopathy, and neuropathy. These are all discussed in detail with the patient. I have re-counseled the patient on diet management and weight loss, by  adopting a carbohydrate restricted / protein rich  Diet. Patient is advised to stick to a routine mealtimes to eat 3 meals  a day and avoid unnecessary snacks ( to snack only to correct hypoglycemia). Patient is advised to eliminate simple carbs  from their diet including cakes desserts ice cream soda (  diet and regular) , sweet tea , Candies,  chips and cookies, artificial sweeteners,   and "sugar-free" products .  This will help patient to have stable blood glucose profile and potentially lose weight.  The patient  will be  scheduled with Jearld Fenton, RDN, CDE for individualized DM education. I have approached patient to continue on  intensive monitoring of blood glucose and insulin therapy, and patient agrees.  I will continue  her basal insulin Levemir 40 units QHS, associated with strict monitoring of glucose  every morning. - Patient is warned not to take insulin without proper monitoring per orders. - She is urged to bring her meter and logs during each visit.  -Patient is encouraged to call clinic for blood glucose levels less than 70 or above 200 mg  /dl. -She is known to have intolerance to metformin -I advised her to continue invokana 100 mg by mouth every day. Side effects and precautions discussed with her. -Target numbers for A1c, LDL, HDL, Triglycerides, Waist Circumference were discussed in detail.  2) HTN: Controlled. Continue current medications including ACEI lisinopril 20 mg.Marland Kitchen 3) HPL: LDL at above target levels, continue statins Crestor 21 mg.. 4)  Weight/Diet: CDE consult, exercise, and carbs information provided.  5) Chronic Care: -Patient is on ACEI and Statin medications and encouraged to continue to follow up with Ophthalmology, Podiatrist at least yearly or according to recommendations, and advised to stay away from smoking. I have recommended yearly flu vaccine and pneumonia vaccination at least every 5 years; moderate intensity exercise for up to 150 minutes weekly; and  sleep for at least 7 hours a day.  Patient to bring meter and  blood glucose logs during their next visit.  Follow up plan: Return in about 3 months (around 01/15/2016) for follow up with pre-visit labs, meter, and logs.  Glade Lloyd, MD Phone: [phone]  Fax: [phone]   10/16/2015, 4:27 PM

## 2015-10-24 ENCOUNTER — Other Ambulatory Visit: Payer: Self-pay | Admitting: "Endocrinology

## 2015-11-24 ENCOUNTER — Other Ambulatory Visit: Payer: Self-pay | Admitting: "Endocrinology

## 2015-12-24 ENCOUNTER — Other Ambulatory Visit: Payer: Self-pay | Admitting: "Endocrinology

## 2015-12-24 ENCOUNTER — Other Ambulatory Visit: Payer: Self-pay | Admitting: Family Medicine

## 2016-02-02 ENCOUNTER — Ambulatory Visit: Payer: BLUE CROSS/BLUE SHIELD | Admitting: "Endocrinology

## 2016-04-20 ENCOUNTER — Encounter: Payer: Self-pay | Admitting: Neurology

## 2016-04-20 ENCOUNTER — Ambulatory Visit (INDEPENDENT_AMBULATORY_CARE_PROVIDER_SITE_OTHER): Payer: Self-pay | Admitting: Neurology

## 2016-04-20 ENCOUNTER — Ambulatory Visit (INDEPENDENT_AMBULATORY_CARE_PROVIDER_SITE_OTHER): Payer: BLUE CROSS/BLUE SHIELD | Admitting: Neurology

## 2016-04-20 DIAGNOSIS — M5412 Radiculopathy, cervical region: Secondary | ICD-10-CM | POA: Diagnosis not present

## 2016-04-20 NOTE — Procedures (Signed)
     HISTORY:  Rebecca Roberson is a 66 year old patient with a history of diabetes who has experienced left arm discomfort and numbness in the left hand since the end of December 2017. The patient has been treated with a wrist splint for presumed carpal tunnel syndrome without benefit. She is referred for evaluation of the left arm discomfort.  NERVE CONDUCTION STUDIES:  Nerve conduction studies were performed on both upper extremities. The distal motor latencies and motor amplitudes for the median and ulnar nerves were within normal limits. The F wave latencies and nerve conduction velocities for these nerves were also normal. The sensory latencies for the median and ulnar nerves were normal.   EMG STUDIES:  EMG study was performed on the left upper extremity:  The first dorsal interosseous muscle reveals 2 to 4 K units with full recruitment. No fibrillations or positive waves were noted. The abductor pollicis brevis muscle reveals 2 to 5 K units with decreased recruitment. No fibrillations or positive waves were noted. The extensor indicis proprius muscle reveals 1 to 3 K units with slightly decreased recruitment. No fibrillations or positive waves were noted. The pronator teres muscle reveals 2 to 3 K units with full recruitment. No fibrillations or positive waves were noted. The biceps muscle reveals 1 to 2 K units with full recruitment. No fibrillations or positive waves were noted. The triceps muscle reveals 2 to 4 K units with full recruitment. No fibrillations or positive waves were noted. The anterior deltoid muscle reveals 2 to 3 K units with full recruitment. No fibrillations or positive waves were noted. The cervical paraspinal muscles were tested at 2 levels. 2+ positive waves were seen at both levels. There was good relaxation.   IMPRESSION:  Nerve conduction studies done on both upper extremities were within normal limits, no definite evidence of carpal tunnel syndrome was  seen. EMG evaluation of the left upper extremity shows chronic stable denervation of the left APB muscle that could be consistent with a previous healed carpal tunnel syndrome. EMG evaluation reveals evidence of significant denervation of the cervical paraspinal muscles suggestive of an overlying cervical radiculopathy of indeterminate level, possibly at the C8 level.  Jill Alexanders MD 04/20/2016 9:24 AM  [facility] [address] [place], [place] [address]  Phone [phone] Fax [phone]

## 2016-04-20 NOTE — Progress Notes (Signed)
Please refer to EMG and nerve conduction study procedure note. 

## 2016-04-22 ENCOUNTER — Other Ambulatory Visit: Payer: Self-pay | Admitting: "Endocrinology

## 2016-06-28 ENCOUNTER — Other Ambulatory Visit: Payer: Self-pay | Admitting: "Endocrinology

## 2016-06-30 ENCOUNTER — Encounter: Payer: Self-pay | Admitting: Neurology

## 2016-06-30 ENCOUNTER — Ambulatory Visit (INDEPENDENT_AMBULATORY_CARE_PROVIDER_SITE_OTHER): Payer: BLUE CROSS/BLUE SHIELD | Admitting: Neurology

## 2016-06-30 VITALS — BP 142/74 | HR 81 | Ht 64.0 in | Wt 199.5 lb

## 2016-06-30 DIAGNOSIS — G959 Disease of spinal cord, unspecified: Secondary | ICD-10-CM | POA: Diagnosis not present

## 2016-06-30 DIAGNOSIS — R202 Paresthesia of skin: Secondary | ICD-10-CM | POA: Diagnosis not present

## 2016-06-30 HISTORY — DX: Disease of spinal cord, unspecified: G95.9

## 2016-06-30 NOTE — Progress Notes (Signed)
Reason for visit: Paresthesias  Referring physician: Dr. Jerilee Hoh Vigeant is a 66 y.o. female  History of present illness:  Ms. Hitchman is a 66 year old right-handed black female with a history of left sided arm and hand numbness and some numbness on the right hand and both legs that has been going on for quite a number of years. The patient was evaluated for a similar problem 7 years ago through Dr. Lily Lovings. At that time, the patient underwent MRI of the cervical spine that showed evidence of a moderate level spinal stenosis at the C5-6 level there was evidence of increased cord signal on the left at that site suggestive of gliosis/spinal cord injury. The patient has had slight worsening of symptoms especially with the left upper extremity. The patient has difficulty feeling objects in her left hand, she will drop things on occasion. She has no definite weakness in the arms. She does have some mild gait instability, she has not had any falls. She reports some problems with constipation and some urinary urgency and occasional incontinence. The patient denies any symptoms of numbness down the arms with head turning. She denies any headaches, vision changes, speech or swallowing problems, or any memory issues. The patient has been through this office for EMG and nerve conduction study evaluation which did not show evidence of a neuropathy. She is sent back for a full evaluation.  Past Medical History:  Diagnosis Date  . Diabetes mellitus without complication ([place])   . Hyperlipidemia   . Hypertension   . Sleep apnea     Past Surgical History:  Procedure Laterality Date  . CERVICAL CONE BIOPSY      Family History  Problem Relation Age of Onset  . Kidney disease Father   . Thyroid disease Father     Social history:  reports that she has never smoked. She has never used smokeless tobacco. She reports that she does not drink alcohol or use drugs.  Medications:  Prior to  Admission medications   Medication Sig Start Date End Date Taking? Authorizing Provider  cyclobenzaprine (FLEXERIL) 10 MG tablet Take 10 mg by mouth 3 (three) times daily as needed for muscle spasms.   Yes [provider]  gabapentin (NEURONTIN) 300 MG capsule Take 300 mg by mouth 3 (three) times daily.   Yes [provider]  glucose blood (ONETOUCH VERIO) test strip USE AS DIRECTED FOUR TIMES DAILY 04/17/15  Yes Nida, Marella Chimes, MD  ibuprofen (ADVIL,MOTRIN) 800 MG tablet Take 800 mg by mouth every 8 (eight) hours as needed.   Yes [provider]  Insulin Detemir (LEVEMIR FLEXPEN [place]) Inject 40 Units into the skin.   Yes [provider]  INVOKANA 100 MG TABS tablet TAKE ONE TABLET BY MOUTH DAILY 12/25/15  Yes Nida, Marella Chimes, MD  lisinopril-hydrochlorothiazide (PRINZIDE,ZESTORETIC) 20-25 MG per tablet Take 1 tablet by mouth daily.   Yes [provider]  LITETOUCH PEN NEEDLES 31G X 8 MM MISC USE FOUR TIMES DAILY 11/25/15  Yes Nida, Marella Chimes, MD  omeprazole (PRILOSEC) 20 MG capsule Take 20 mg by mouth daily.   Yes [provider]  [facility] VERIO test strip USE AS DIRECTED FOUR TIMES DAILY 12/25/15  Yes Fayrene Helper, MD  PARoxetine (PAXIL) 20 MG tablet Take 20 mg by mouth daily.   Yes [provider]  potassium chloride (KLOR-CON) 20 MEQ packet Take by mouth 2 (two) times daily.   Yes [provider]  rosuvastatin (CRESTOR)  10 MG tablet TAKE ONE TABLET BY MOUTH ONCE DAILY 11/25/14  Yes Nida, Marella Chimes, MD  tolterodine (DETROL LA) 4 MG 24 hr capsule Take 4 mg by mouth daily.   Yes [provider]  traMADol (ULTRAM) 50 MG tablet Take by mouth every 6 (six) hours as needed.   Yes [provider]  Vitamin D, Ergocalciferol, (DRISDOL) 50000 UNITS CAPS capsule Take 50,000 Units by mouth every 7 (seven) days.   Yes [provider]      Allergies  Allergen Reactions  . Sulfa  Antibiotics     ROS:  Out of a complete 14 system review of symptoms, the patient complains only of the following symptoms, and all other reviewed systems are negative.  Heart murmur Swollen abdomen, constipation Sleep apnea, snoring Incontinence of the bladder Back pain, achy muscles, muscle cramps, neck pain, neck stiffness Moles  Blood pressure (!) 142/74, pulse 81, height 5\' 4"  (1.626 m), weight 199 lb 8 oz (90.5 kg).  Physical Exam  General: The patient is alert and cooperative at the time of the examination.  Eyes: Pupils are equal, round, and reactive to light. Discs are flat bilaterally.  Neck: The neck is supple, no carotid bruits are noted.  Respiratory: The respiratory examination is clear.  Cardiovascular: The cardiovascular examination reveals a regular rate and rhythm, no obvious murmurs or rubs are noted.  Skin: Extremities are without significant edema.  Neurologic Exam  Mental status: The patient is alert and oriented x 3 at the time of the examination. The patient has apparent normal recent and remote memory, with an apparently normal attention span and concentration ability.  Cranial nerves: Facial symmetry is present. There is good sensation of the face to pinprick and soft touch bilaterally. The strength of the facial muscles and the muscles to head turning and shoulder shrug are normal bilaterally. Speech is well enunciated, no aphasia or dysarthria is noted. Extraocular movements are full. Visual fields are full. The tongue is midline, and the patient has symmetric elevation of the soft palate. No obvious hearing deficits are noted.  Motor: The motor testing reveals 5 over 5 strength of all 4 extremities. Good symmetric motor tone is noted throughout.  Sensory: Sensory testing is intact to pinprick, soft touch, vibration sensation, and position sense on all 4 extremities, With exception of a stocking pattern pinprick sensory deficit up to the knees  bilaterally. No evidence of extinction is noted.  Coordination: Cerebellar testing reveals good finger-nose-finger and heel-to-shin bilaterally.  Gait and station: Gait is  slightly wide-based, the patient takes short shuffling steps. Tandem gait is minimally unsteady. Romberg is negative. No drift is seen.  Reflexes: Deep tendon reflexes are symmetric and normal bilaterally.The ankle jerk reflexes are well-maintained bilaterally.  Toes are downgoing bilaterally.   MRI cervical 10/07/10:  IMPRESSION: Cervical spondylotic changes most notable C5-6 level where there is altered signal intensity within the left aspect of the cord which may represent gliosis and / or edema.  Please see above for further Detail.  * MRI scan images were reviewed online. I agree with the written report.   EMG and NCV 04/20/16:  IMPRESSION:  Nerve conduction studies done on both upper extremities were within normal limits, no definite evidence of carpal tunnel syndrome was seen. EMG evaluation of the left upper extremity shows chronic stable denervation of the left APB muscle that could be consistent with a previous healed carpal tunnel syndrome. EMG evaluation reveals evidence of significant denervation of the cervical  paraspinal muscles suggestive of an overlying cervical radiculopathy of indeterminate level, possibly at the C8 level.   Assessment/Plan:  1. Paresthesias of all 4 extremities   2. Cervical myelopathy by MRI   The patient had evidence of a cervical myelopathy 7 years ago. The patient has had some gradual worsening of symptoms, we will repeat the MRI of the cervical spine. The patient will undergo blood work to look for metabolic causes of numbness and paresthesias. She will follow-up in 4 or 5 months.    Jill Alexanders MD 06/30/2016 2:01 PM  [facility] [address] [place],  [address]  Phone [phone] Fax [phone]

## 2016-06-30 NOTE — Patient Instructions (Signed)
   We will check blood work today and get a MRI of the neck.

## 2016-07-01 ENCOUNTER — Telehealth: Payer: Self-pay | Admitting: Neurology

## 2016-07-01 LAB — ANGIOTENSIN CONVERTING ENZYME

## 2016-07-01 LAB — RPR: RPR Ser Ql: NONREACTIVE

## 2016-07-01 LAB — VITAMIN B12: VITAMIN B 12: 564 pg/mL (ref 232–1245)

## 2016-07-01 LAB — SEDIMENTATION RATE: Sed Rate: 20 mm/hr (ref 0–40)

## 2016-07-01 LAB — B. BURGDORFI ANTIBODIES: Lyme IgG/IgM Ab: <0.91 {ISR} (ref 0.00–0.90)

## 2016-07-01 LAB — HIV ANTIBODY (ROUTINE TESTING W REFLEX): HIV SCREEN 4TH GENERATION: NONREACTIVE

## 2016-07-01 NOTE — Telephone Encounter (Signed)
[employer] [place] auth: [place] Referance # [id number]  Scheduled to have her MRI at [facility] on 07/07/16 at 4:00 pm [facility]

## 2016-07-02 ENCOUNTER — Telehealth: Payer: Self-pay | Admitting: *Deleted

## 2016-07-02 NOTE — Telephone Encounter (Signed)
Called and spoke with pt about unremarkable labs per CW,MD note. Pt verbalized understanding.  Sent copy to Dr Scotty Court per pt request.

## 2016-07-02 NOTE — Telephone Encounter (Signed)
-----   Message from Kathrynn Ducking, MD sent at 07/01/2016  4:42 PM EDT -----  The blood work results are unremarkable. Please call the patient.  ----- Message ----- From: Lavone Neri Lab Results In Sent: 07/01/2016   7:42 AM To: Kathrynn Ducking, MD

## 2016-07-07 ENCOUNTER — Telehealth: Payer: Self-pay | Admitting: Neurology

## 2016-07-07 ENCOUNTER — Ambulatory Visit (HOSPITAL_COMMUNITY)
Admission: RE | Admit: 2016-07-07 | Discharge: 2016-07-07 | Disposition: A | Discharge status: Home / Self Care | Payer: BLUE CROSS/BLUE SHIELD | Source: Ambulatory Visit | Attending: Neurology | Admitting: Neurology

## 2016-07-07 DIAGNOSIS — M4712 Other spondylosis with myelopathy, cervical region: Secondary | ICD-10-CM | POA: Diagnosis not present

## 2016-07-07 DIAGNOSIS — R202 Paresthesia of skin: Secondary | ICD-10-CM | POA: Diagnosis present

## 2016-07-07 DIAGNOSIS — G959 Disease of spinal cord, unspecified: Secondary | ICD-10-CM

## 2016-07-07 NOTE — Telephone Encounter (Signed)
I called patient. The MRI shows a large slipped disc at the C2-3 level with spinal cord compression and hyperintensity within the spinal cord. The patient will need surgery.  I will refer the patient to a neurosurgeon.   MRI cervical 07/07/16:  IMPRESSION: Interval development of moderately large extruded disc fragment at C2-3 with downgoing disc material. There is cord compression and cord hyperintensity.  Degenerative changes throughout the remainder of the cervical spine are stable. Mild cord hyperintensity at C5-6 unchanged.

## 2016-07-14 ENCOUNTER — Other Ambulatory Visit: Payer: Self-pay | Admitting: Neurosurgery

## 2016-07-26 ENCOUNTER — Encounter (HOSPITAL_COMMUNITY)
Admission: RE | Admit: 2016-07-26 | Discharge: 2016-07-26 | Disposition: A | Discharge status: Home / Self Care | Payer: BLUE CROSS/BLUE SHIELD | Source: Ambulatory Visit | Attending: Neurosurgery | Admitting: Neurosurgery

## 2016-07-26 ENCOUNTER — Encounter (HOSPITAL_COMMUNITY): Payer: Self-pay | Admitting: *Deleted

## 2016-07-26 DIAGNOSIS — R9431 Abnormal electrocardiogram [ECG] [EKG]: Secondary | ICD-10-CM | POA: Insufficient documentation

## 2016-07-26 DIAGNOSIS — M5001 Cervical disc disorder with myelopathy,  high cervical region: Secondary | ICD-10-CM | POA: Diagnosis not present

## 2016-07-26 DIAGNOSIS — Z01810 Encounter for preprocedural cardiovascular examination: Secondary | ICD-10-CM | POA: Diagnosis present

## 2016-07-26 DIAGNOSIS — Z01818 Encounter for other preprocedural examination: Secondary | ICD-10-CM | POA: Diagnosis not present

## 2016-07-26 HISTORY — DX: Gastro-esophageal reflux disease without esophagitis: K21.9

## 2016-07-26 HISTORY — DX: Cardiac murmur, unspecified: R01.1

## 2016-07-26 HISTORY — DX: Unspecified osteoarthritis, unspecified site: M19.90

## 2016-07-26 HISTORY — DX: Personal history of urinary calculi: Z87.442

## 2016-07-26 LAB — TYPE AND SCREEN
ABO/RH(D): O POS
ANTIBODY SCREEN: NEGATIVE

## 2016-07-26 LAB — CBC
HCT: 39.4 % (ref 36.0–46.0)
Hemoglobin: 12.7 g/dL (ref 12.0–15.0)
MCH: 27.5 pg (ref 26.0–34.0)
MCHC: 32.2 g/dL (ref 30.0–36.0)
MCV: 85.5 fL (ref 78.0–100.0)
PLATELETS: 240 10*3/uL (ref 150–400)
RBC: 4.61 MIL/uL (ref 3.87–5.11)
RDW: 14.1 % (ref 11.5–15.5)
WBC: 6.9 10*3/uL (ref 4.0–10.5)

## 2016-07-26 LAB — BASIC METABOLIC PANEL
Anion gap: 8 (ref 5–15)
BUN: 13 mg/dL (ref 6–20)
CO2: 30 mmol/L (ref 22–32)
CREATININE: 0.66 mg/dL (ref 0.44–1.00)
Calcium: 9.2 mg/dL (ref 8.9–10.3)
Chloride: 99 mmol/L — ABNORMAL LOW (ref 101–111)
GFR calc Af Amer: >60 mL/min (ref >60)
Glucose, Bld: 129 mg/dL — ABNORMAL HIGH (ref 65–99)
POTASSIUM: 3.2 mmol/L — AB (ref 3.5–5.1)
SODIUM: 137 mmol/L (ref 135–145)

## 2016-07-26 LAB — ABO/RH: ABO/RH(D): O POS

## 2016-07-26 LAB — SURGICAL PCR SCREEN
MRSA, PCR: NEGATIVE
STAPHYLOCOCCUS AUREUS: NEGATIVE

## 2016-07-26 LAB — GLUCOSE, CAPILLARY: GLUCOSE-CAPILLARY: 130 mg/dL — AB (ref 65–99)

## 2016-07-26 NOTE — Pre-Procedure Instructions (Addendum)
Rebecca Roberson  07/26/2016      Rebecca Roberson - Rebecca Roberson, Rebecca Roberson, [place] - [place] [id number] Phone: [phone] Fax: [phone]    Your procedure is scheduled on 07/30/16  Report to [facility] at 650 A.M.  Call this number if you have problems the morning of surgery:  [phone]   Remember:  Do not eat food or drink liquids after midnight.  Take these medicines the morning of surgery with A SIP OF WATER        Gabapentin,omeprazole(prilosec),paxil,tramadol if needed  STOP all herbel meds, nsaids (aleve,naproxen,advil,ibuprofen) prior to surgery starting today 07/26/16 including all vitamins/supplements,aspirin   no invokana day of surgery   How to Manage Your Diabetes Before and After Surgery  Why is it important to control my blood sugar before and after surgery? . Improving blood sugar levels before and after surgery helps healing and can limit problems. . A way of improving blood sugar control is eating a healthy diet by: o  Eating less sugar and carbohydrates o  Increasing activity/exercise o  Talking with your doctor about reaching your blood sugar goals . High blood sugars (greater than 180 mg/dL) can raise your risk of infections and slow your recovery, so you will need to focus on controlling your diabetes during the weeks before surgery. . Make sure that the doctor who takes care of your diabetes knows about your planned surgery including the date and location.  How do I manage my blood sugar before surgery? . Check your blood sugar at least 4 times a day, starting 2 days before surgery, to make sure that the level is not too high or low. o Check your blood sugar the morning of your surgery when you wake up and every 2 hours until you get to the [facility] unit. . If your blood sugar is less than 70 mg/dL, you will need to treat for low blood sugar: o Do not take insulin. o Treat a low blood sugar (less  than 70 mg/dL) with  cup of clear juice (cranberry or apple), 4 glucose tablets, OR glucose gel. o Recheck blood sugar in 15 minutes after treatment (to make sure it is greater than 70 mg/dL). If your blood sugar is not greater than 70 mg/dL on recheck, call [phone] for further instructions. . Report your blood sugar to the [facility] nurse when you get to [facility].  . If you are admitted to the hospital after surgery: o Your blood sugar will be checked by the staff and you will probably be given insulin after surgery (instead of oral diabetes medicines) to make sure you have good blood sugar levels. o The goal for blood sugar control after surgery is 80-180 mg/dL.     WHAT DO I DO ABOUT MY DIABETES MEDICATION?   [facility] Kitchen Do not take oral diabetes medicines (pills) the morning of surgery.(no invokana)  THE NIGHT BEFORE SURGERY, take 20 units of levemir insulin     Do not wear jewelry, make-up or nail polish.  Do not wear lotions, powders, or perfumes, or deoderant.  Do not shave 48 hours prior to surgery.  Men may shave face and neck.  Do not bring valuables to the hospital.  [facility] is not responsible for any belongings or valuables.  Contacts, dentures or bridgework may not be worn into surgery.  Leave your suitcase in the car.  After surgery it may be brought  to your room.  For patients admitted to the hospital, discharge time will be determined by your treatment team.  Patients discharged the day of surgery will not be allowed to drive home.   Special instructions:   Special Instructions: [facility] - Preparing for Surgery  Before surgery, you can play an important role.  Because skin is not sterile, your skin needs to be as free of germs as possible.  You can reduce the number of germs on you skin by washing with CHG (chlorahexidine gluconate) soap before surgery.  CHG is an antiseptic cleaner which kills germs and bonds with the skin to continue killing germs even after  washing.  Please DO NOT use if you have an allergy to CHG or antibacterial soaps.  If your skin becomes reddened/irritated stop using the CHG and inform your nurse when you arrive at [facility].  Do not shave (including legs and underarms) for at least 48 hours prior to the first CHG shower.  You may shave your face.  Please follow these instructions carefully:   1.  Shower with CHG Soap the night before surgery and the morning of Surgery.  2.  If you choose to wash your hair, wash your hair first as usual with your normal shampoo.  3.  After you shampoo, rinse your hair and body thoroughly to remove the Shampoo.  4.  Use CHG as you would any other liquid soap.  You can apply chg directly  to the skin and wash gently with scrungie or a clean washcloth.  5.  Apply the CHG Soap to your body ONLY FROM THE NECK DOWN.  Do not use on open wounds or open sores.  Avoid contact with your eyes ears, mouth and genitals (private parts).  Wash genitals (private parts)       with your normal soap.  6.  Wash thoroughly, paying special attention to the area where your surgery will be performed.  7.  Thoroughly rinse your body with warm water from the neck down.  8.  DO NOT shower/wash with your normal soap after using and rinsing off the CHG Soap.  9.  Pat yourself dry with a clean towel.            10.  Wear clean pajamas.            11.  Place clean sheets on your bed the night of your first shower and do not sleep with pets.  Day of Surgery  Do not apply any lotions/deodorants the morning of surgery.  Please wear clean clothes to the hospital/surgery center.  Please read over the  fact sheets that you were given.

## 2016-07-27 LAB — HEMOGLOBIN A1C
Hgb A1c MFr Bld: 8.2 % — ABNORMAL HIGH (ref 4.8–5.6)
Mean Plasma Glucose: 189 mg/dL

## 2016-07-27 NOTE — Progress Notes (Signed)
Anesthesia Chart Review:  Pt is a 66 year old female scheduled for C2-3 ACDF on 07/30/2016 with Ashok Pall, MD  - PCP is Matthias Hughs, MD  PMH includes:  HTN, DM, hyperlipidemia, heart murmur (unspecified), OSA, GERD.  Never smoker. BMI 34  Medications include: ASA 81 mg, Levemir, Prilosec, potassium  Preoperative labs reviewed. HbA1c 8.2, glucose 129.  EKG 07/26/16: NSR. Septal infarct, age undetermined  Echo 05/08/10 ([facility]):  1. Estimated EF 60-65%. Normal LV systolic function. Mild concentric LVH.  2. LA chamber size is normal. 3. Valves are otherwise unremarkable without any hemodynamically significant lesions. 4. No dilatation of the ascending aorta. 5. Inferior vena cava appears normal    If no changes, I anticipate pt can proceed with surgery as scheduled.   Willeen Cass, FNP-BC [facility]/Anesthesiology Phone: [phone] 07/27/2016 12:58 PM

## 2016-07-30 ENCOUNTER — Ambulatory Visit (HOSPITAL_COMMUNITY): Payer: BLUE CROSS/BLUE SHIELD | Admitting: Certified Registered Nurse Anesthetist

## 2016-07-30 ENCOUNTER — Ambulatory Visit (HOSPITAL_COMMUNITY)
Admission: RE | Admit: 2016-07-30 | Discharge: 2016-08-04 | Disposition: A | Discharge status: Home / Self Care | Payer: BLUE CROSS/BLUE SHIELD | Source: Ambulatory Visit | Attending: Neurosurgery | Admitting: Neurosurgery

## 2016-07-30 ENCOUNTER — Ambulatory Visit (HOSPITAL_COMMUNITY): Payer: BLUE CROSS/BLUE SHIELD

## 2016-07-30 ENCOUNTER — Ambulatory Visit (HOSPITAL_COMMUNITY): Payer: BLUE CROSS/BLUE SHIELD | Admitting: Emergency Medicine

## 2016-07-30 ENCOUNTER — Encounter (HOSPITAL_COMMUNITY)
Admission: RE | Disposition: A | Discharge status: Home / Self Care | Payer: Self-pay | Source: Ambulatory Visit | Attending: Neurosurgery

## 2016-07-30 ENCOUNTER — Encounter (HOSPITAL_COMMUNITY): Payer: Self-pay | Admitting: *Deleted

## 2016-07-30 DIAGNOSIS — Z419 Encounter for procedure for purposes other than remedying health state, unspecified: Secondary | ICD-10-CM

## 2016-07-30 DIAGNOSIS — Z791 Long term (current) use of non-steroidal anti-inflammatories (NSAID): Secondary | ICD-10-CM | POA: Insufficient documentation

## 2016-07-30 DIAGNOSIS — Z794 Long term (current) use of insulin: Secondary | ICD-10-CM | POA: Diagnosis not present

## 2016-07-30 DIAGNOSIS — M5001 Cervical disc disorder with myelopathy,  high cervical region: Secondary | ICD-10-CM | POA: Insufficient documentation

## 2016-07-30 DIAGNOSIS — Z79899 Other long term (current) drug therapy: Secondary | ICD-10-CM | POA: Insufficient documentation

## 2016-07-30 DIAGNOSIS — I10 Essential (primary) hypertension: Secondary | ICD-10-CM | POA: Insufficient documentation

## 2016-07-30 DIAGNOSIS — E119 Type 2 diabetes mellitus without complications: Secondary | ICD-10-CM | POA: Diagnosis not present

## 2016-07-30 DIAGNOSIS — M79642 Pain in left hand: Secondary | ICD-10-CM | POA: Diagnosis present

## 2016-07-30 DIAGNOSIS — M5000 Cervical disc disorder with myelopathy, unspecified cervical region: Secondary | ICD-10-CM | POA: Diagnosis present

## 2016-07-30 HISTORY — PX: ANTERIOR CERVICAL DECOMP/DISCECTOMY FUSION: SHX1161

## 2016-07-30 LAB — GLUCOSE, CAPILLARY
GLUCOSE-CAPILLARY: 276 mg/dL — AB (ref 65–99)
Glucose-Capillary: 167 mg/dL — ABNORMAL HIGH (ref 65–99)
Glucose-Capillary: 228 mg/dL — ABNORMAL HIGH (ref 65–99)
Glucose-Capillary: 249 mg/dL — ABNORMAL HIGH (ref 65–99)

## 2016-07-30 SURGERY — ANTERIOR CERVICAL DECOMPRESSION/DISCECTOMY FUSION 1 LEVEL
Anesthesia: General | Site: Spine Cervical

## 2016-07-30 MED ORDER — VITAMIN B-12 1000 MCG PO TABS
500.0000 ug | ORAL_TABLET | Freq: Every day | ORAL | Status: DC
  Administered 2016-07-31 – 2016-08-04 (×5): 500 ug via ORAL
  Filled 2016-07-30 (×6): qty 1

## 2016-07-30 MED ORDER — FLUTICASONE PROPIONATE 50 MCG/ACT NA SUSP
2.0000 | Freq: Every day | NASAL | Status: DC
  Administered 2016-07-31 – 2016-08-01 (×2): 2 via NASAL
  Filled 2016-07-30: qty 16

## 2016-07-30 MED ORDER — LIDOCAINE-EPINEPHRINE 0.5 %-1:200000 IJ SOLN
INTRAMUSCULAR | Status: DC | PRN
  Administered 2016-07-30: 7 mL

## 2016-07-30 MED ORDER — CEFAZOLIN SODIUM-DEXTROSE 2-4 GM/100ML-% IV SOLN
2.0000 g | INTRAVENOUS | Status: AC
  Administered 2016-07-30: 2 g via INTRAVENOUS

## 2016-07-30 MED ORDER — FENTANYL CITRATE (PF) 250 MCG/5ML IJ SOLN
INTRAMUSCULAR | Status: AC
  Filled 2016-07-30: qty 5

## 2016-07-30 MED ORDER — ACETAMINOPHEN 650 MG RE SUPP: 650.0000 mg | RECTAL | Status: DC | PRN

## 2016-07-30 MED ORDER — ACETAMINOPHEN 325 MG PO TABS
650.0000 mg | ORAL_TABLET | ORAL | Status: DC | PRN
  Administered 2016-08-03: 650 mg via ORAL
  Filled 2016-07-30: qty 2

## 2016-07-30 MED ORDER — ONDANSETRON HCL 4 MG/2ML IJ SOLN
INTRAMUSCULAR | Status: DC | PRN
  Administered 2016-07-30: 4 mg via INTRAVENOUS

## 2016-07-30 MED ORDER — ROCURONIUM BROMIDE 10 MG/ML (PF) SYRINGE
PREFILLED_SYRINGE | INTRAVENOUS | Status: DC | PRN
  Administered 2016-07-30: 40 mg via INTRAVENOUS
  Administered 2016-07-30: 10 mg via INTRAVENOUS

## 2016-07-30 MED ORDER — PHENOL 1.4 % MT LIQD
1.0000 | OROMUCOSAL | Status: DC | PRN
  Administered 2016-07-30: 1 via OROMUCOSAL
  Filled 2016-07-30: qty 177

## 2016-07-30 MED ORDER — FESOTERODINE FUMARATE ER 8 MG PO TB24
8.0000 mg | ORAL_TABLET | Freq: Every day | ORAL | Status: DC
  Administered 2016-07-31 – 2016-08-04 (×5): 8 mg via ORAL
  Filled 2016-07-30 (×5): qty 1

## 2016-07-30 MED ORDER — ALBUMIN HUMAN 5 % IV SOLN
INTRAVENOUS | Status: DC | PRN
  Administered 2016-07-30: 11:00:00 via INTRAVENOUS

## 2016-07-30 MED ORDER — ROSUVASTATIN CALCIUM 5 MG PO TABS: 10.0000 mg | ORAL_TABLET | Freq: Every day | ORAL | Status: DC

## 2016-07-30 MED ORDER — ROCURONIUM BROMIDE 50 MG/5ML IV SOLN
INTRAVENOUS | Status: AC
  Filled 2016-07-30: qty 1

## 2016-07-30 MED ORDER — GABAPENTIN 300 MG PO CAPS: 300.0000 mg | ORAL_CAPSULE | ORAL | Status: DC | PRN

## 2016-07-30 MED ORDER — MORPHINE SULFATE (PF) 2 MG/ML IV SOLN
2.0000 mg | INTRAVENOUS | Status: DC | PRN
  Administered 2016-07-30 (×2): 2 mg via INTRAVENOUS
  Filled 2016-07-30 (×2): qty 1

## 2016-07-30 MED ORDER — OXYCODONE HCL 5 MG PO TABS
ORAL_TABLET | ORAL | Status: AC
  Filled 2016-07-30: qty 1

## 2016-07-30 MED ORDER — SUGAMMADEX SODIUM 200 MG/2ML IV SOLN
INTRAVENOUS | Status: AC
  Filled 2016-07-30: qty 2

## 2016-07-30 MED ORDER — SODIUM CHLORIDE 0.9 % IV SOLN: 250.0000 mL | INTRAVENOUS | Status: DC

## 2016-07-30 MED ORDER — 0.9 % SODIUM CHLORIDE (POUR BTL) OPTIME
TOPICAL | Status: DC | PRN
  Administered 2016-07-30: 1000 mL

## 2016-07-30 MED ORDER — POTASSIUM CHLORIDE CRYS ER 10 MEQ PO TBCR
10.0000 meq | EXTENDED_RELEASE_TABLET | Freq: Every day | ORAL | Status: DC
  Administered 2016-07-31 – 2016-08-04 (×5): 10 meq via ORAL
  Filled 2016-07-30 (×5): qty 1

## 2016-07-30 MED ORDER — POTASSIUM CHLORIDE IN NACL 20-0.9 MEQ/L-% IV SOLN
INTRAVENOUS | Status: DC
  Administered 2016-07-30: 15:00:00 via INTRAVENOUS
  Filled 2016-07-30 (×2): qty 1000

## 2016-07-30 MED ORDER — OXYCODONE HCL 5 MG PO TABS
5.0000 mg | ORAL_TABLET | ORAL | Status: DC | PRN
  Administered 2016-07-30 – 2016-07-31 (×5): 10 mg via ORAL
  Administered 2016-07-31: 5 mg via ORAL
  Administered 2016-08-01 (×3): 10 mg via ORAL
  Administered 2016-08-02: 5 mg via ORAL
  Administered 2016-08-02 – 2016-08-03 (×4): 10 mg via ORAL
  Administered 2016-08-03: 5 mg via ORAL
  Filled 2016-07-30: qty 2
  Filled 2016-07-30: qty 1
  Filled 2016-07-30 (×13): qty 2

## 2016-07-30 MED ORDER — PHENYLEPHRINE 40 MCG/ML (10ML) SYRINGE FOR IV PUSH (FOR BLOOD PRESSURE SUPPORT)
PREFILLED_SYRINGE | INTRAVENOUS | Status: AC
  Filled 2016-07-30: qty 10

## 2016-07-30 MED ORDER — THROMBIN 5000 UNITS EX SOLR
CUTANEOUS | Status: DC | PRN
  Administered 2016-07-30: 10000 [IU] via TOPICAL

## 2016-07-30 MED ORDER — FENTANYL CITRATE (PF) 100 MCG/2ML IJ SOLN
25.0000 ug | INTRAMUSCULAR | Status: DC | PRN
  Administered 2016-07-30: 50 ug via INTRAVENOUS
  Administered 2016-07-30 (×2): 25 ug via INTRAVENOUS

## 2016-07-30 MED ORDER — CHLORHEXIDINE GLUCONATE CLOTH 2 % EX PADS: 6.0000 | MEDICATED_PAD | Freq: Once | CUTANEOUS | Status: DC

## 2016-07-30 MED ORDER — LACTATED RINGERS IV SOLN
INTRAVENOUS | Status: DC | PRN
  Administered 2016-07-30 (×2): via INTRAVENOUS

## 2016-07-30 MED ORDER — THROMBIN 5000 UNITS EX SOLR
CUTANEOUS | Status: AC
  Filled 2016-07-30: qty 10000

## 2016-07-30 MED ORDER — MIDAZOLAM HCL 5 MG/5ML IJ SOLN
INTRAMUSCULAR | Status: DC | PRN
  Administered 2016-07-30 (×2): 1 mg via INTRAVENOUS

## 2016-07-30 MED ORDER — ONDANSETRON HCL 4 MG/2ML IJ SOLN: 4.0000 mg | Freq: Four times a day (QID) | INTRAMUSCULAR | Status: DC | PRN

## 2016-07-30 MED ORDER — LISINOPRIL 20 MG PO TABS
20.0000 mg | ORAL_TABLET | Freq: Every day | ORAL | Status: DC
  Administered 2016-07-31 – 2016-08-04 (×5): 20 mg via ORAL
  Filled 2016-07-30 (×5): qty 1

## 2016-07-30 MED ORDER — SODIUM CHLORIDE 0.9% FLUSH: 3.0000 mL | INTRAVENOUS | Status: DC | PRN

## 2016-07-30 MED ORDER — PANTOPRAZOLE SODIUM 40 MG PO TBEC
40.0000 mg | DELAYED_RELEASE_TABLET | Freq: Every day | ORAL | Status: DC
  Administered 2016-07-31 – 2016-08-04 (×5): 40 mg via ORAL
  Filled 2016-07-30 (×5): qty 1

## 2016-07-30 MED ORDER — PROPOFOL 1000 MG/100ML IV EMUL
INTRAVENOUS | Status: AC
  Filled 2016-07-30: qty 100

## 2016-07-30 MED ORDER — DIAZEPAM 5 MG PO TABS
5.0000 mg | ORAL_TABLET | Freq: Four times a day (QID) | ORAL | Status: DC | PRN
  Administered 2016-07-31 – 2016-08-03 (×2): 5 mg via ORAL
  Filled 2016-07-30 (×2): qty 1

## 2016-07-30 MED ORDER — LACTATED RINGERS IV SOLN
INTRAVENOUS | Status: DC
  Administered 2016-07-30: 08:00:00 via INTRAVENOUS

## 2016-07-30 MED ORDER — TRAMADOL HCL 50 MG PO TABS: 100.0000 mg | ORAL_TABLET | Freq: Three times a day (TID) | ORAL | Status: DC | PRN

## 2016-07-30 MED ORDER — INSULIN ASPART 100 UNIT/ML ~~LOC~~ SOLN
0.0000 [IU] | Freq: Three times a day (TID) | SUBCUTANEOUS | Status: DC
  Administered 2016-07-30: 11 [IU] via SUBCUTANEOUS
  Administered 2016-07-31: 4 [IU] via SUBCUTANEOUS
  Administered 2016-08-01: 3 [IU] via SUBCUTANEOUS
  Administered 2016-08-01 – 2016-08-02 (×2): 4 [IU] via SUBCUTANEOUS
  Administered 2016-08-02: 11 [IU] via SUBCUTANEOUS
  Administered 2016-08-02 – 2016-08-03 (×2): 7 [IU] via SUBCUTANEOUS
  Administered 2016-08-03: 3 [IU] via SUBCUTANEOUS
  Administered 2016-08-03 – 2016-08-04 (×2): 4 [IU] via SUBCUTANEOUS

## 2016-07-30 MED ORDER — INSULIN DETEMIR 100 UNIT/ML ~~LOC~~ SOLN
40.0000 [IU] | Freq: Every day | SUBCUTANEOUS | Status: DC
  Administered 2016-07-30 – 2016-08-04 (×4): 40 [IU] via SUBCUTANEOUS
  Filled 2016-07-30 (×5): qty 0.4

## 2016-07-30 MED ORDER — HYDROCHLOROTHIAZIDE 25 MG PO TABS
25.0000 mg | ORAL_TABLET | Freq: Every day | ORAL | Status: DC
  Administered 2016-07-31 – 2016-08-04 (×5): 25 mg via ORAL
  Filled 2016-07-30 (×5): qty 1

## 2016-07-30 MED ORDER — VITAMIN D (ERGOCALCIFEROL) 1.25 MG (50000 UNIT) PO CAPS
50000.0000 [IU] | ORAL_CAPSULE | ORAL | Status: DC
  Administered 2016-08-02: 50000 [IU] via ORAL
  Filled 2016-07-30: qty 1

## 2016-07-30 MED ORDER — LIDOCAINE HCL (CARDIAC) 20 MG/ML IV SOLN
INTRAVENOUS | Status: AC
  Filled 2016-07-30: qty 5

## 2016-07-30 MED ORDER — OXYCODONE HCL 5 MG PO TABS
ORAL_TABLET | ORAL | Status: AC
Start: 2016-07-30 — End: 2016-07-31
  Filled 2016-07-30: qty 1

## 2016-07-30 MED ORDER — PROPOFOL 10 MG/ML IV BOLUS
INTRAVENOUS | Status: DC | PRN
  Administered 2016-07-30 (×2): 20 mg via INTRAVENOUS
  Administered 2016-07-30: 130 mg via INTRAVENOUS
  Administered 2016-07-30: 30 mg via INTRAVENOUS

## 2016-07-30 MED ORDER — ONDANSETRON HCL 4 MG/2ML IJ SOLN
INTRAMUSCULAR | Status: AC
  Filled 2016-07-30: qty 4

## 2016-07-30 MED ORDER — SODIUM CHLORIDE 0.9% FLUSH
3.0000 mL | Freq: Two times a day (BID) | INTRAVENOUS | Status: DC
  Administered 2016-07-31 – 2016-08-03 (×7): 3 mL via INTRAVENOUS

## 2016-07-30 MED ORDER — SUGAMMADEX SODIUM 200 MG/2ML IV SOLN
INTRAVENOUS | Status: DC | PRN
  Administered 2016-07-30: 200 mg via INTRAVENOUS

## 2016-07-30 MED ORDER — LIDOCAINE-EPINEPHRINE 0.5 %-1:200000 IJ SOLN
INTRAMUSCULAR | Status: AC
  Filled 2016-07-30: qty 1

## 2016-07-30 MED ORDER — PAROXETINE HCL 20 MG PO TABS
20.0000 mg | ORAL_TABLET | Freq: Every day | ORAL | Status: DC
  Administered 2016-07-31 – 2016-08-04 (×5): 20 mg via ORAL
  Filled 2016-07-30 (×5): qty 1

## 2016-07-30 MED ORDER — FENTANYL CITRATE (PF) 100 MCG/2ML IJ SOLN
INTRAMUSCULAR | Status: DC | PRN
  Administered 2016-07-30 (×3): 50 ug via INTRAVENOUS
  Administered 2016-07-30: 100 ug via INTRAVENOUS
  Administered 2016-07-30: 50 ug via INTRAVENOUS

## 2016-07-30 MED ORDER — DIPHENHYDRAMINE HCL 25 MG PO CAPS
50.0000 mg | ORAL_CAPSULE | Freq: Four times a day (QID) | ORAL | Status: DC | PRN
  Administered 2016-07-30: 50 mg via ORAL
  Filled 2016-07-30: qty 2

## 2016-07-30 MED ORDER — DEXAMETHASONE SODIUM PHOSPHATE 10 MG/ML IJ SOLN
INTRAMUSCULAR | Status: DC | PRN
  Administered 2016-07-30: 10 mg via INTRAVENOUS

## 2016-07-30 MED ORDER — MENTHOL 3 MG MT LOZG: 1.0000 | LOZENGE | OROMUCOSAL | Status: DC | PRN

## 2016-07-30 MED ORDER — LISINOPRIL-HYDROCHLOROTHIAZIDE 20-25 MG PO TABS: 1.0000 | ORAL_TABLET | Freq: Every day | ORAL | Status: DC

## 2016-07-30 MED ORDER — ZOLPIDEM TARTRATE 5 MG PO TABS: 5.0000 mg | ORAL_TABLET | Freq: Every evening | ORAL | Status: DC | PRN

## 2016-07-30 MED ORDER — MIDAZOLAM HCL 2 MG/2ML IJ SOLN
INTRAMUSCULAR | Status: AC
  Filled 2016-07-30: qty 2

## 2016-07-30 MED ORDER — DEXAMETHASONE SODIUM PHOSPHATE 10 MG/ML IJ SOLN
INTRAMUSCULAR | Status: AC
  Filled 2016-07-30: qty 2

## 2016-07-30 MED ORDER — CANAGLIFLOZIN 100 MG PO TABS: 100.0000 mg | ORAL_TABLET | Freq: Every day | ORAL | Status: DC

## 2016-07-30 MED ORDER — CEFAZOLIN SODIUM-DEXTROSE 2-4 GM/100ML-% IV SOLN
INTRAVENOUS | Status: AC
  Filled 2016-07-30: qty 100

## 2016-07-30 MED ORDER — ASPIRIN EC 81 MG PO TBEC
81.0000 mg | DELAYED_RELEASE_TABLET | Freq: Every day | ORAL | Status: DC
  Administered 2016-07-31 – 2016-08-04 (×5): 81 mg via ORAL
  Filled 2016-07-30 (×5): qty 1

## 2016-07-30 MED ORDER — LIDOCAINE 2% (20 MG/ML) 5 ML SYRINGE
INTRAMUSCULAR | Status: DC | PRN
  Administered 2016-07-30: 100 mg via INTRAVENOUS

## 2016-07-30 MED ORDER — FENTANYL CITRATE (PF) 100 MCG/2ML IJ SOLN
INTRAMUSCULAR | Status: AC
  Filled 2016-07-30: qty 2

## 2016-07-30 MED ORDER — ONDANSETRON HCL 4 MG PO TABS: 4.0000 mg | ORAL_TABLET | Freq: Four times a day (QID) | ORAL | Status: DC | PRN

## 2016-07-30 SURGICAL SUPPLY — 55 items
BLADE CLIPPER SURG (BLADE) IMPLANT
BNDG GAUZE ELAST 4 BULKY (GAUZE/BANDAGES/DRESSINGS) IMPLANT
BUR DRUM 4.0 (BURR) ×2 IMPLANT
BUR DRUM 4.0MM (BURR) ×1
BUR MATCHSTICK NEURO 3.0 LAGG (BURR) ×3 IMPLANT
CANISTER SUCT 3000ML PPV (MISCELLANEOUS) ×3 IMPLANT
CARTRIDGE OIL MAESTRO DRILL (MISCELLANEOUS) ×1 IMPLANT
DECANTER SPIKE VIAL GLASS SM (MISCELLANEOUS) IMPLANT
DERMABOND ADVANCED (GAUZE/BANDAGES/DRESSINGS) ×2
DERMABOND ADVANCED .7 DNX12 (GAUZE/BANDAGES/DRESSINGS) ×1 IMPLANT
DIFFUSER DRILL AIR PNEUMATIC (MISCELLANEOUS) ×3 IMPLANT
DRAPE C-ARM 35X43 STRL (DRAPES) ×6 IMPLANT
DRAPE HALF SHEET 40X57 (DRAPES) IMPLANT
DRAPE LAPAROTOMY 100X72 PEDS (DRAPES) ×3 IMPLANT
DRAPE MICROSCOPE LEICA (MISCELLANEOUS) ×3 IMPLANT
DRAPE POUCH INSTRU U-SHP 10X18 (DRAPES) ×3 IMPLANT
DURAPREP 6ML APPLICATOR 50/CS (WOUND CARE) ×6 IMPLANT
ELECT COATED BLADE 2.86 ST (ELECTRODE) ×3 IMPLANT
ELECT REM PT RETURN 9FT ADLT (ELECTROSURGICAL) ×3
ELECTRODE REM PT RTRN 9FT ADLT (ELECTROSURGICAL) ×1 IMPLANT
GAUZE SPONGE 4X4 16PLY XRAY LF (GAUZE/BANDAGES/DRESSINGS) IMPLANT
GLOVE BIO SURGEON STRL SZ8 (GLOVE) ×3 IMPLANT
GLOVE ECLIPSE 6.5 STRL STRAW (GLOVE) ×3 IMPLANT
GLOVE ECLIPSE 7.5 STRL STRAW (GLOVE) ×3 IMPLANT
GLOVE EXAM NITRILE LRG STRL (GLOVE) IMPLANT
GLOVE EXAM NITRILE XL STR (GLOVE) IMPLANT
GLOVE EXAM NITRILE XS STR PU (GLOVE) ×3 IMPLANT
GOWN STRL REUS W/ TWL LRG LVL3 (GOWN DISPOSABLE) ×2 IMPLANT
GOWN STRL REUS W/ TWL XL LVL3 (GOWN DISPOSABLE) ×1 IMPLANT
GOWN STRL REUS W/TWL 2XL LVL3 (GOWN DISPOSABLE) ×3 IMPLANT
GOWN STRL REUS W/TWL LRG LVL3 (GOWN DISPOSABLE) ×4
GOWN STRL REUS W/TWL XL LVL3 (GOWN DISPOSABLE) ×2
KIT BASIN OR (CUSTOM PROCEDURE TRAY) ×3 IMPLANT
KIT ROOM TURNOVER OR (KITS) ×3 IMPLANT
NEEDLE HYPO 25X1 1.5 SAFETY (NEEDLE) ×3 IMPLANT
NEEDLE SPNL 22GX3.5 QUINCKE BK (NEEDLE) ×3 IMPLANT
NS IRRIG 1000ML POUR BTL (IV SOLUTION) ×3 IMPLANT
OIL CARTRIDGE MAESTRO DRILL (MISCELLANEOUS) ×3
PACK LAMINECTOMY NEURO (CUSTOM PROCEDURE TRAY) ×3 IMPLANT
PAD ARMBOARD 7.5X6 YLW CONV (MISCELLANEOUS) ×9 IMPLANT
PLATE 20MM (Plate) ×3 IMPLANT
RUBBERBAND STERILE (MISCELLANEOUS) ×6 IMPLANT
SCREW 4.0X13 (Screw) ×4 IMPLANT
SCREW 4.0X13MM (Screw) ×8 IMPLANT
SPACER ACF PARALLEL 7MM (Bone Implant) ×3 IMPLANT
SPACER PARALLEL 6MM CC ACF (Bone Implant) IMPLANT
SPONGE INTESTINAL PEANUT (DISPOSABLE) ×6 IMPLANT
SPONGE SURGIFOAM ABS GEL SZ50 (HEMOSTASIS) ×3 IMPLANT
SUT VIC AB 0 CT1 27 (SUTURE) ×4
SUT VIC AB 0 CT1 27XBRD ANBCTR (SUTURE) ×1 IMPLANT
SUT VIC AB 0 CT1 27XBRD ANTBC (SUTURE) ×1 IMPLANT
SUT VIC AB 3-0 SH 8-18 (SUTURE) ×3 IMPLANT
TOWEL GREEN STERILE (TOWEL DISPOSABLE) ×3 IMPLANT
TOWEL GREEN STERILE FF (TOWEL DISPOSABLE) ×3 IMPLANT
WATER STERILE IRR 1000ML POUR (IV SOLUTION) ×3 IMPLANT

## 2016-07-30 NOTE — Anesthesia Preprocedure Evaluation (Addendum)
Anesthesia Evaluation  Patient identified by MRN, date of birth, ID band Patient awake    Reviewed: Allergy & Precautions, H&P , Patient's Chart, lab work & pertinent test results, reviewed documented beta blocker date and time   Airway Mallampati: II  TM Distance: >3 FB Neck ROM: full    Dental no notable dental hx. (+) Caps, Dental Advisory Given   Pulmonary    Pulmonary exam normal breath sounds clear to auscultation       Cardiovascular hypertension,  Rhythm:regular Rate:Normal     Neuro/Psych    GI/Hepatic   Endo/Other  diabetes  Renal/GU      Musculoskeletal   Abdominal   Peds  Hematology   Anesthesia Other Findings   Reproductive/Obstetrics                            Anesthesia Physical Anesthesia Plan  ASA: III  Anesthesia Plan: General   Post-op Pain Management:    Induction: Intravenous  PONV Risk Score and Plan:   Airway Management Planned: Oral ETT  Additional Equipment:   Intra-op Plan:   Post-operative Plan: Extubation in OR  Informed Consent: I have reviewed the patients History and Physical, chart, labs and discussed the procedure including the risks, benefits and alternatives for the proposed anesthesia with the patient or authorized representative who has indicated his/her understanding and acceptance.   Dental Advisory Given  Plan Discussed with: CRNA and Surgeon  Anesthesia Plan Comments: (  )        Anesthesia Quick Evaluation

## 2016-07-30 NOTE — Anesthesia Procedure Notes (Signed)
Procedure Name: Intubation Date/Time: 07/30/2016 9:42 AM Performed by: Merdis Delay Pre-anesthesia Checklist: Patient identified, Emergency Drugs available, Suction available, Patient being monitored and Timeout performed Patient Re-evaluated:Patient Re-evaluated prior to inductionOxygen Delivery Method: Circle system utilized Preoxygenation: Pre-oxygenation with 100% oxygen Intubation Type: IV induction Ventilation: Mask ventilation without difficulty Laryngoscope Size: Mac and 3 Grade View: Grade II Tube type: Oral Tube size: 7.5 mm Number of attempts: 1 Airway Equipment and Method: Stylet Placement Confirmation: ETT inserted through vocal cords under direct vision,  positive ETCO2 and breath sounds checked- equal and bilateral Secured at: 22 cm Tube secured with: Tape Dental Injury: Teeth and Oropharynx as per pre-operative assessment  Difficulty Due To: Difficulty was anticipated

## 2016-07-30 NOTE — Progress Notes (Signed)
Patient reports itching after receiving morphine. MD is paged to notify

## 2016-07-30 NOTE — Op Note (Signed)
07/30/2016  12:10 PM  PATIENT:  Rebecca Roberson  66 y.o. 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 I recommended and she agrees to an ACDF at C2/3.  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  POST-OPERATIVE DIAGNOSIS:  same  PROCEDURE:  Anterior Cervical decompression C2/3 Arthrodesis C2/3 with 55mm structural allograft Anterior instrumentation(nuvasive helix) C2/3  SURGEON:   Surgeon(s): Ashok Pall, MD Eustace Moore, MD   ASSISTANTS:Jones, Shanon Brow  ANESTHESIA:   general  EBL:  Total I/O In: 9675 [I.V.:1400; IV Piggyback:250] Out: 425 [Blood:425]  BLOOD ADMINISTERED:none  CELL SAVER GIVEN:none  COUNT:per nursing  DRAINS: none   SPECIMEN:  No Specimen  DICTATION: Rebecca Roberson was taken to the operating room, intubated, and placed under general anesthesia without difficulty. She was positioned supine with her head in slight extension on a horseshoe headrest. The neck was prepped and draped in a sterile manner. I infiltrated 4 cc's 1/2%lidocaine/1:200,000 strength epinephrine into the planned incision starting from the midline to the medial border of the left sternocleidomastoid muscle. I opened the incision with a 10 blade and dissected sharply through soft tissue to the platysma. I dissected in the plane superior to the platysma both rostrally and caudally. I then opened the platysma in a horizontal fashion with Metzenbaum scissors, and dissected in the inferior plane rostrally and caudally. With both blunt and sharp technique I created an avascular corridor to the cervical spine. I placed a spinal needle(s) in the disc space at 2/3 . I then reflected the longus colli from C2 to C3 and placed self retaining retractors. We opened the disc space(s) at 2/3 with a 15 blade. I removed disc with curettes, Kerrison punches, and the drill. Using the drill I removed osteophytes and prepared for the decompression.   I decompressed the spinal canal and the C3 root(s) with the drill, Kerrison punches, and the curettes. I used the microscope to aid in microdissection. I removed the posterior longitudinal ligament to fully expose and decompress the thecal sac. I exposed the roots laterally taking down the 2/3 uncovertebral joints. With the decompression complete I moved on to the arthrodesis. I used the drill to level the surfaces of C2,3. I removed soft tissue to prepare the disc space and the bony surfaces. I measured the space and placed a 74mm structural allograft into the disc space.  I then placed the anterior instrumentation. I placed 2 screws in each vertebral body through the plate. I locked the screws into place. Intraoperative xray showed the graft, plate, and screws to be in good position. I irrigated the wound, achieved hemostasis, and closed the wound in layers. I approximated the platysma, and the subcuticular plane with vicryl sutures. I used Dermabond for a sterile dressing.   PLAN OF CARE: Admit for overnight observation  PATIENT DISPOSITION:  PACU - hemodynamically stable.   Delay start of Pharmacological VTE agent (>24hrs) due to surgical blood loss or risk of bleeding:  yes

## 2016-07-30 NOTE — H&P (Signed)
BP (!) 167/75   Pulse 76   Temp 97.9 F (36.6 C) (Oral)   Resp 18   SpO2 99%  I have seen Rebecca Roberson before, she said maybe 7 years ago, but I think that was for her lower back. She comes in today because she has had problems with her hands hurting, weakness in the left hand and it was thought maybe she had carpal tunnel. An EMG was done. It did not show that. She was also sent to Rheumatology, and rheumatoid arthritis and lupus erythematosus was ruled out. Her hands she says are numb, arms feel numb. She has some numbness also in her thighs. 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 She is 66 years of age, right-handed. She does not use alcohol. She does not smoke. She has no history of substance abuse.  She has an allergy to Sulfa containing medications, which causes hives. She takes Lisinopril Hydrochlorothiazide pill, Paxil, Ibuprofen, Gabapentin, Vitamin D, Levemir, Potassium.  Mother and father are both deceased. Sickle cell disease, stroke and cervical cancer are present in the family history. She is weaker in the left hand than right. She says the tramadol does help with her pain, which is present in the arm, neck, and legs and back. Numbness and tingling in the hands, arms, feet and hips. REVIEW OF SYSTEMS: Positive for night sweats, sinus problems, heart murmur, hypercholesterolemia, swelling in the feet, leg pain with walking, abdominal pain, kidney stones, arthritis, neck pain, arm weakness, leg weakness, back pain, arm pain, leg pain, joint pain, problems with coordination in arms and legs. PHYSICAL EXAMINATION: She is alert, oriented by 4, answering all questions appropriately. Memory, language, attention span, and fund of knowledge normal. Speech is clear. It is also fluent. Tongue and uvula in the midline. Shoulder shrug is normal. Hearing intact to voice. Is hyperreflexic at the knees, left ankle, not at the right ankle. 2+ reflexes in the  upper extremities. She did not have a crossed adductor response. She did not have clonus. Romberg is negative, though she sways a little bit. Difficulty with tandem walking. Fine motor movement appears to be intact. IMAGING PROCEDURE: MRI shows a large disc herniation at C2-3 causing cord deformation and also cord signal. She has altered cord signal present at 5-6 but there is no compression of the spinal cord or nerve roots. 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 I have explained to [name] that I think she needs to have this disc removed. She can undergo an ACDF of C3.  We are going to plan on this next week Friday. Risks and benefits of bleeding, infection, no relief, need for further surgery, fusion failure, hardware failure, damage to the spinal cord, paralysis, further weakness were discussed. She understands and wishes to proceed. I gave her a detailed instruction sheet.

## 2016-07-30 NOTE — Progress Notes (Signed)
Patient arrived to room A&O X4.Marland Kitchen Patient is given and educated about incentive spirometer. Patient is receptive. Patient is educated about importance of early ambulation. Patient is receptive. Will continue to monitor

## 2016-07-30 NOTE — Progress Notes (Signed)
Patient is requesting something soft to eat. Diet changed- Will continue to monitor

## 2016-07-30 NOTE — Transfer of Care (Signed)
Immediate Anesthesia Transfer of Care Note  Patient: MIRREN GEST  Procedure(s) Performed: Procedure(s) with comments: ANTERIOR CERVICAL DECOMPRESSION/DISCECTOMY FUSION CERVICAL TWO-THREE (N/A) - ANTERIOR CERVICAL DECOMPRESSION/DISCECTOMY FUSION CERVICAL 2- CERVICAL 3  Patient Location: PACU  Anesthesia Type:General  Level of Consciousness: awake, alert  and patient cooperative  Airway & Oxygen Therapy: Patient Spontanous Breathing and Patient connected to nasal cannula oxygen  Post-op Assessment: Report given to RN and Post -op Vital signs reviewed and stable  Post vital signs: Reviewed and stable  Last Vitals:  Vitals:   07/30/16 0730  BP: (!) 167/75  Pulse: 76  Resp: 18  Temp: 36.6 C    Last Pain:  Vitals:   07/30/16 0730  TempSrc: Oral  PainSc:       Patients Stated Pain Goal: 8 (83/33/83 2919)  Complications: No apparent anesthesia complications

## 2016-07-30 NOTE — Progress Notes (Signed)
Inpatient Diabetes Program Recommendations  AACE/ADA: New Consensus Statement on Inpatient Glycemic Control (2015)  Target Ranges:  Prepandial:   less than 140 mg/dL      Peak postprandial:   less than 180 mg/dL (1-2 hours)      Critically ill patients:  140 - 180 mg/dL   Lab Results  Component Value Date   GLUCAP 167 (H) 07/30/2016   HGBA1C 8.2 (H) 07/26/2016    Review of Glycemic Control:  Results for ANDRINA, LOCKEN (MRN [id number]) as of 07/30/2016 11:42  Ref. Range 07/30/2016 07:12  Glucose-Capillary Latest Ref Range: 65 - 99 mg/dL 167 (H)   Diabetes history: Type 2 diabetes Outpatient Diabetes medications: Levemir 40 units q HS Current orders for Inpatient glycemic control:  In OR Inpatient Diabetes Program Recommendations:    Post-op and while in the hospital, consider ordering Levemir 30 units q HS and Novolog correction resistant tid with meals and HS.   Thanks, Adah Perl, RN, BC-ADM Inpatient Diabetes Coordinator Pager [phone] (8a-5p)

## 2016-07-30 NOTE — Progress Notes (Signed)
Benadryl given per order.PT walked to bathroom. Will continue to monitor

## 2016-07-31 DIAGNOSIS — M5001 Cervical disc disorder with myelopathy,  high cervical region: Secondary | ICD-10-CM | POA: Diagnosis not present

## 2016-07-31 LAB — GLUCOSE, CAPILLARY
GLUCOSE-CAPILLARY: 165 mg/dL — AB (ref 65–99)
GLUCOSE-CAPILLARY: 132 mg/dL — AB (ref 65–99)

## 2016-07-31 MED ORDER — ALUM & MAG HYDROXIDE-SIMETH 200-200-20 MG/5ML PO SUSP
30.0000 mL | Freq: Four times a day (QID) | ORAL | Status: DC | PRN
  Administered 2016-07-31 – 2016-08-01 (×2): 30 mL via ORAL
  Filled 2016-07-31 (×3): qty 30

## 2016-07-31 NOTE — Progress Notes (Signed)
Patient up to bedside chair with call light in reach.  Alert, verbally pleasant.  Complaints of "soreness" to throat and neck.  Manage pain and discomfort with prn medications with effective results.  Wound clean, dry without drainage.

## 2016-07-31 NOTE — Progress Notes (Signed)
Patient ID: Rebecca Roberson, female   DOB: 06-Aug-1950, 66 y.o.   MRN: [id number]  BP 130/76 (BP Location: Right Arm)   Pulse 78   Temp 98.1 F (36.7 C) (Oral)   Resp 18   SpO2 97%  Alert and oriented x 4, speech is clear and fluent.  Moving all extremities well Wound is clean,dry, no signs of infection Not swallowing well Discharge tomorrow.

## 2016-08-01 DIAGNOSIS — M5001 Cervical disc disorder with myelopathy,  high cervical region: Secondary | ICD-10-CM | POA: Diagnosis not present

## 2016-08-01 MED ORDER — DEXAMETHASONE 4 MG PO TABS
4.0000 mg | ORAL_TABLET | Freq: Three times a day (TID) | ORAL | Status: DC
  Administered 2016-08-01 – 2016-08-04 (×9): 4 mg via ORAL
  Filled 2016-08-01 (×9): qty 1

## 2016-08-01 NOTE — Progress Notes (Signed)
Pt's pain controlled with PRN pain med overnight. Refused to ambulate. Up to bathroom X 2 this shift. Will continue to monitor.

## 2016-08-01 NOTE — Progress Notes (Signed)
Patient ID: Rebecca Roberson, female   DOB: 1950-04-17, 66 y.o.   MRN: [id number] BP 120/71 (BP Location: Right Arm)   Pulse 93   Temp 98.4 F (36.9 C) (Oral)   Resp 20   SpO2 95%  Alert and oriented Still with problems swallowing Will give decadron Hopeful discharge tomorrow Strength is better in hands. Wound is clean and dry

## 2016-08-02 ENCOUNTER — Encounter (HOSPITAL_COMMUNITY): Payer: Self-pay | Admitting: Neurosurgery

## 2016-08-02 DIAGNOSIS — M5001 Cervical disc disorder with myelopathy,  high cervical region: Secondary | ICD-10-CM | POA: Diagnosis not present

## 2016-08-02 LAB — GLUCOSE, CAPILLARY
GLUCOSE-CAPILLARY: 148 mg/dL — AB (ref 65–99)
GLUCOSE-CAPILLARY: 138 mg/dL — AB (ref 65–99)
GLUCOSE-CAPILLARY: 141 mg/dL — AB (ref 65–99)
GLUCOSE-CAPILLARY: 154 mg/dL — AB (ref 65–99)
GLUCOSE-CAPILLARY: 196 mg/dL — AB (ref 65–99)
Glucose-Capillary: 166 mg/dL — ABNORMAL HIGH (ref 65–99)
Glucose-Capillary: 167 mg/dL — ABNORMAL HIGH (ref 65–99)
Glucose-Capillary: 200 mg/dL — ABNORMAL HIGH (ref 65–99)
Glucose-Capillary: 266 mg/dL — ABNORMAL HIGH (ref 65–99)
Glucose-Capillary: 180 mg/dL — ABNORMAL HIGH (ref 65–99)

## 2016-08-02 MED ORDER — MAGNESIUM CITRATE PO SOLN
1.0000 | Freq: Once | ORAL | Status: AC
  Administered 2016-08-02: 1 via ORAL
  Filled 2016-08-02: qty 296

## 2016-08-02 MED ORDER — POLYETHYLENE GLYCOL 3350 17 G PO PACK
17.0000 g | PACK | Freq: Every day | ORAL | Status: DC
  Administered 2016-08-02 – 2016-08-04 (×3): 17 g via ORAL
  Filled 2016-08-02 (×3): qty 1

## 2016-08-02 MED ORDER — BISACODYL 5 MG PO TBEC: 5.0000 mg | DELAYED_RELEASE_TABLET | Freq: Every day | ORAL | Status: DC | PRN

## 2016-08-02 NOTE — Anesthesia Postprocedure Evaluation (Signed)
Anesthesia Post Note  Patient: Rebecca Roberson  Procedure(s) Performed: Procedure(s) (LRB): ANTERIOR CERVICAL DECOMPRESSION/DISCECTOMY FUSION CERVICAL TWO-THREE (N/A)     Patient location during evaluation: PACU Anesthesia Type: General Level of consciousness: awake and alert Pain management: pain level controlled Vital Signs Assessment: post-procedure vital signs reviewed and stable Respiratory status: spontaneous breathing, nonlabored ventilation, respiratory function stable and patient connected to nasal cannula oxygen Cardiovascular status: blood pressure returned to baseline and stable Postop Assessment: no signs of nausea or vomiting Anesthetic complications: no    Last Vitals:  Vitals:   08/02/16 1359 08/02/16 1749  BP: (!) 112/56 117/70  Pulse: 88 87  Resp: 18 (!) 22  Temp: 36.8 C 37.1 C    Last Pain:  Vitals:   08/02/16 1749  TempSrc: Oral  PainSc:    Pain Goal: Patients Stated Pain Goal: 3 (07/31/16 0800)               Lyndle Herrlich EDWARD

## 2016-08-02 NOTE — Care Management Note (Signed)
Case Management Note  Patient Details  Name: Rebecca Roberson MRN: [id number] Date of Birth: 1950-03-28  Subjective/Objective:    Pt s/p cervical surgery.  She is from home alone.              Action/Plan: Plan is for patient to return home when medically ready. CM following for d/c needs, physician orders.   Expected Discharge Date:                  Expected Discharge Plan:  Home/Self Care  In-House Referral:     Discharge planning Services     Post Acute Care Choice:    Choice offered to:     DME Arranged:    DME Agency:     HH Arranged:    [place] Agency:     Status of Service:  Completed, signed off  If discussed at [employer] of Stay Meetings, dates discussed:    Additional Comments:  Pollie Friar, RN 08/02/2016, 3:47 PM

## 2016-08-02 NOTE — Plan of Care (Signed)
Problem: Safety: Goal: Ability to remain free from injury will improve Outcome: Progressing No safety issues noted, safety precautions maintained  Problem: Pain Managment: Goal: General experience of comfort will improve Outcome: Progressing Medicated once for pain with full relief, eyes closed at present moment, no acute distress noted  Problem: Physical Regulation: Goal: Will remain free from infection Outcome: Progressing No S/S of infection noted  Problem: Skin Integrity: Goal: Risk for impaired skin integrity will decrease Outcome: Progressing Skin within normal limit except the incision to anterior neck closed with surgical glue, no redness or swelling noted  Problem: Tissue Perfusion: Goal: Risk factors for ineffective tissue perfusion will decrease Outcome: Progressing SCDs are on, no signs of DVT noted  Problem: Activity: Goal: Risk for activity intolerance will decrease Outcome: Progressing In and out of bed independently and tolerates well  Problem: Bowel/Gastric: Goal: Will not experience complications related to bowel motility Outcome: Not Progressing Last BM on 07/29/16, was given a bottle of magnesium Citrate but does not want to drin it, patient has been educated for it, she stated she will try to drink. Will continue to monitor.

## 2016-08-02 NOTE — Progress Notes (Signed)
Pt refused to ambulate in halls overnight. However, did walk to bathroom X 3. Pt stated pain was somewhat better using decadron for inflammation and is eating better. Pain controlled with PRN pain meds.

## 2016-08-02 NOTE — Progress Notes (Signed)
Mag citrate given to patient per order. Will continue to monitor

## 2016-08-02 NOTE — Progress Notes (Signed)
Patient ID: Rebecca Roberson, female   DOB: [birth year], 66 y.o.   MRN: [id number] BP 129/63 (BP Location: Right Arm)   Pulse 76   Temp 98.2 F (36.8 C) (Oral)   Resp 18   SpO2 96%  Alert and oriented x 4 Voice is strong Complains of difficulty swallowing, of foods being hung up Will monitor Normal strength

## 2016-08-03 DIAGNOSIS — M5001 Cervical disc disorder with myelopathy,  high cervical region: Secondary | ICD-10-CM | POA: Diagnosis not present

## 2016-08-03 LAB — GLUCOSE, CAPILLARY
GLUCOSE-CAPILLARY: 187 mg/dL — AB (ref 65–99)
GLUCOSE-CAPILLARY: 241 mg/dL — AB (ref 65–99)
Glucose-Capillary: 150 mg/dL — ABNORMAL HIGH (ref 65–99)
Glucose-Capillary: 298 mg/dL — ABNORMAL HIGH (ref 65–99)

## 2016-08-03 MED ORDER — TRAMADOL HCL 50 MG PO TABS: 100.0000 mg | ORAL_TABLET | Freq: Four times a day (QID) | ORAL | 0 refills | Status: AC | PRN

## 2016-08-03 NOTE — Discharge Summary (Signed)
Physician Discharge Summary  Patient ID: Rebecca Roberson MRN: [id number] DOB/AGE: [birth year] 66 y.o.  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 Post op she had significant difficulty with swallowing. She never had pooling of secretions in her throat, and she was able to eat. Strength has been improving slightly in the upper extremities. Her wound is clean,dry, and without signs of infection. She is moving all extremities well.  Treatments: surgery: ACDF C2/3, nuvasive plate  Discharge Exam: Blood pressure (!) 129/59, pulse 77, temperature 98.6 F (37 C), temperature source Oral, resp. rate 20, SpO2 99 %. General appearance: alert, cooperative, appears stated age and no distress Neurologic: Alert and oriented X 3, normal strength and tone. Normal symmetric reflexes. Normal coordination and gait  Disposition: Final discharge disposition not confirmed HERNIATED NUCLEUS PULPOSUS WITH MYELOPATHY, CERVICAL REGION  Allergies as of 08/03/2016      Reactions   Statins Other (See Comments)   Weak all over , muscle weakness    Sulfa Antibiotics Rash, Other (See Comments)   Gets a fever       Medication List    TAKE these medications   aspirin EC 81 MG tablet Take 81 mg by mouth daily.   cyclobenzaprine 10 MG tablet Commonly known as:  FLEXERIL Take 10 mg by mouth at bedtime as needed for muscle spasms.   fluticasone 50 MCG/ACT nasal spray Commonly known as:  FLONASE Place 2 sprays into both nostrils daily.   gabapentin 300 MG capsule Commonly known as:  NEURONTIN Take 300 mg by mouth as needed.   glucose blood test strip Commonly known as:  ONETOUCH VERIO USE AS DIRECTED FOUR  TIMES DAILY   ONETOUCH VERIO test strip Generic drug:  glucose blood USE AS DIRECTED FOUR TIMES DAILY   ibuprofen 800 MG tablet Commonly known as:  ADVIL,MOTRIN Take 800 mg by mouth 3 (three) times daily as needed for moderate pain.   INVOKANA 100 MG Tabs tablet Generic drug:  canagliflozin TAKE ONE TABLET BY MOUTH DAILY   LEVEMIR FLEXPEN [place] Inject 40 Units into the skin at bedtime.   lisinopril-hydrochlorothiazide 20-25 MG tablet Commonly known as:  PRINZIDE,ZESTORETIC Take 1 tablet by mouth daily.   LITETOUCH PEN NEEDLES 31G X 8 MM Misc Generic drug:  Insulin Pen Needle USE FOUR TIMES DAILY   omeprazole 20 MG capsule Commonly known as:  PRILOSEC Take 20 mg by mouth daily.   PARoxetine 20 MG tablet Commonly known as:  PAXIL Take 20 mg by mouth daily.   potassium chloride 10 MEQ tablet Commonly known as:  K-DUR,KLOR-CON Take 10 mEq by mouth daily.   rosuvastatin 10 MG tablet Commonly known as:  CRESTOR TAKE ONE TABLET BY MOUTH ONCE DAILY   tolterodine 4 MG 24 hr capsule Commonly known as:  DETROL LA Take 4 mg by mouth daily.   traMADol 50 MG tablet Commonly known as:  ULTRAM Take 2 tablets (100 mg total) by mouth every 6 (six) hours as needed for moderate pain. What changed:  when to take this   vitamin B-12 500 MCG tablet Commonly known as:  CYANOCOBALAMIN Take 500 mcg by mouth daily.   Vitamin D (Ergocalciferol) 50000 units Caps capsule Commonly known as:  DRISDOL Take 50,000 Units by  mouth every Monday.        Signed: Viola Kinnick L 08/03/2016, 6:40 PM

## 2016-08-03 NOTE — Discharge Instructions (Signed)

## 2016-08-04 ENCOUNTER — Other Ambulatory Visit: Payer: Self-pay | Admitting: "Endocrinology

## 2016-08-04 DIAGNOSIS — M5001 Cervical disc disorder with myelopathy,  high cervical region: Secondary | ICD-10-CM | POA: Diagnosis not present

## 2016-08-04 DIAGNOSIS — E118 Type 2 diabetes mellitus with unspecified complications: Principal | ICD-10-CM

## 2016-08-04 DIAGNOSIS — E785 Hyperlipidemia, unspecified: Secondary | ICD-10-CM

## 2016-08-04 DIAGNOSIS — E1165 Type 2 diabetes mellitus with hyperglycemia: Secondary | ICD-10-CM

## 2016-08-04 LAB — GLUCOSE, CAPILLARY: GLUCOSE-CAPILLARY: 160 mg/dL — AB (ref 65–99)

## 2016-08-04 NOTE — Care Management Note (Signed)
Case Management Note  Patient Details  Name: Rebecca Roberson MRN: [id number] Date of Birth: Sep 13, 1950  Subjective/Objective:                    Action/Plan: Pt discharging home with self care. Pt has insurance, hospital follow up and transportation home. No further needs per CM.   Expected Discharge Date:  08/04/16               Expected Discharge Plan:  Home/Self Care  In-House Referral:     Discharge planning Services     Post Acute Care Choice:    Choice offered to:     DME Arranged:    DME Agency:     HH Arranged:    HH Agency:     Status of Service:  Completed, signed off  If discussed at [employer] of Stay Meetings, dates discussed:    Additional Comments:  Pollie Friar, RN 08/04/2016, 12:36 PM

## 2016-08-04 NOTE — Progress Notes (Signed)
D/c reviewed with patient. Encouraged to call and make a follow up appointment with MD

## 2016-08-07 LAB — MICROALBUMIN / CREATININE URINE RATIO
CREATININE, URINE: 170 mg/dL (ref 20–320)
MICROALB UR: 1.2 mg/dL
MICROALB/CREAT RATIO: 7 ug/mg{creat} (ref <30)

## 2016-08-07 LAB — COMPREHENSIVE METABOLIC PANEL
ALT: 36 U/L — ABNORMAL HIGH (ref 6–29)
AST: 41 U/L — AB (ref 10–35)
Albumin: 3.5 g/dL — ABNORMAL LOW (ref 3.6–5.1)
Alkaline Phosphatase: 53 U/L (ref 33–130)
BILIRUBIN TOTAL: 0.5 mg/dL (ref 0.2–1.2)
BUN: 17 mg/dL (ref 7–25)
CO2: 26 mmol/L (ref 20–31)
CREATININE: 0.74 mg/dL (ref 0.50–0.99)
Calcium: 8.7 mg/dL (ref 8.6–10.4)
Chloride: 97 mmol/L — ABNORMAL LOW (ref 98–110)
GLUCOSE: 129 mg/dL — AB (ref 65–99)
Potassium: 3.5 mmol/L (ref 3.5–5.3)
SODIUM: 140 mmol/L (ref 135–146)
Total Protein: 6.4 g/dL (ref 6.1–8.1)

## 2016-08-07 LAB — LIPID PANEL
Cholesterol: 266 mg/dL — ABNORMAL HIGH (ref <200)
HDL: 42 mg/dL — AB (ref >50)
LDL Cholesterol: 188 mg/dL — ABNORMAL HIGH (ref <100)
Total CHOL/HDL Ratio: 6.3 Ratio — ABNORMAL HIGH (ref <5.0)
Triglycerides: 178 mg/dL — ABNORMAL HIGH (ref <150)
VLDL: 36 mg/dL — ABNORMAL HIGH (ref <30)

## 2016-08-09 ENCOUNTER — Encounter: Payer: Self-pay | Admitting: "Endocrinology

## 2016-08-09 ENCOUNTER — Ambulatory Visit (INDEPENDENT_AMBULATORY_CARE_PROVIDER_SITE_OTHER): Payer: BLUE CROSS/BLUE SHIELD | Admitting: "Endocrinology

## 2016-08-09 VITALS — BP 111/72 | HR 89 | Ht 64.0 in | Wt 194.0 lb

## 2016-08-09 DIAGNOSIS — I10 Essential (primary) hypertension: Secondary | ICD-10-CM

## 2016-08-09 DIAGNOSIS — Z9119 Patient's noncompliance with other medical treatment and regimen: Secondary | ICD-10-CM

## 2016-08-09 DIAGNOSIS — E782 Mixed hyperlipidemia: Secondary | ICD-10-CM

## 2016-08-09 DIAGNOSIS — E6609 Other obesity due to excess calories: Secondary | ICD-10-CM | POA: Diagnosis not present

## 2016-08-09 DIAGNOSIS — E1165 Type 2 diabetes mellitus with hyperglycemia: Secondary | ICD-10-CM | POA: Diagnosis not present

## 2016-08-09 DIAGNOSIS — Z6834 Body mass index (BMI) 34.0-34.9, adult: Secondary | ICD-10-CM

## 2016-08-09 DIAGNOSIS — Z794 Long term (current) use of insulin: Secondary | ICD-10-CM

## 2016-08-09 DIAGNOSIS — Z91199 Patient's noncompliance with other medical treatment and regimen due to unspecified reason: Secondary | ICD-10-CM | POA: Insufficient documentation

## 2016-08-09 DIAGNOSIS — IMO0001 Reserved for inherently not codable concepts without codable children: Secondary | ICD-10-CM

## 2016-08-09 MED ORDER — DAPAGLIFLOZIN PROPANEDIOL 5 MG PO TABS: 5.0000 mg | ORAL_TABLET | Freq: Every day | ORAL | 2 refills | Status: DC

## 2016-08-09 NOTE — Progress Notes (Signed)
Subjective:    Patient ID: Rebecca Roberson, female    DOB: 02-26-1950,    Past Medical History:  Diagnosis Date  . Arthritis   . Cervical myelopathy ([place]) 06/30/2016  . Diabetes mellitus without complication ([place])   . GERD (gastroesophageal reflux disease)   . Heart murmur   . History of kidney stones    has one now  . Hyperlipidemia   . Hypertension   . Sleep apnea    cpap have not used 3 yrs   Past Surgical History:  Procedure Laterality Date  . ANTERIOR CERVICAL DECOMP/DISCECTOMY FUSION N/A 07/30/2016   Procedure: ANTERIOR CERVICAL DECOMPRESSION/DISCECTOMY FUSION CERVICAL TWO-THREE;  Surgeon: Ashok Pall, MD;  Location: [facility];  Service: Neurosurgery;  Laterality: N/A;  ANTERIOR CERVICAL DECOMPRESSION/DISCECTOMY FUSION CERVICAL 2- CERVICAL 3  . CERVICAL CON[place] BIOPSY     Social History   Social History  . Marital status: Single    Spouse name: N/A  . Number of children: 0  . Years of education: 14   Occupational History  . [employer]    Social History Main Topics  . Smoking status: Never Smoker  . Smokeless tobacco: Never Used  . Alcohol use No  . Drug use: No  . Sexual activity: Not Asked   Other Topics Concern  . None   Social History Narrative   Lives alone   Caffeine use: Drinks soda (20oz per day)   Tea sometimes   Right-handed   Outpatient Encounter Prescriptions as of 08/09/2016  Medication Sig  . aspirin EC 81 MG tablet Take 81 mg by mouth daily.  . cyclobenzaprine (FLEXERIL) 10 MG tablet Take 10 mg by mouth at bedtime as needed for muscle spasms.   . dapagliflozin propanediol (FARXIGA) 5 MG TABS tablet Take 5 mg by mouth daily.  . fluticasone (FLONASE) 50 MCG/ACT nasal spray Place 2 sprays into both nostrils daily.  Marland Kitchen gabapentin (NEURONTIN) 300 MG capsule Take 300 mg by mouth as needed.   Marland Kitchen glucose blood (ONETOUCH VERIO) test strip USE AS DIRECTED FOUR TIMES DAILY  . ibuprofen (ADVIL,MOTRIN) 800 MG tablet Take 800 mg by mouth 3 (three)  times daily as needed for moderate pain.   . Insulin Detemir (LEVEMIR FLEXPEN [place]) Inject 40 Units into the skin at bedtime.   Marland Kitchen lisinopril-hydrochlorothiazide (PRINZIDE,ZESTORETIC) 20-25 MG per tablet Take 1 tablet by mouth daily.  Marland Kitchen LITETOUCH PEN NEEDLES 31G X 8 MM MISC USE FOUR TIMES DAILY  . omeprazole (PRILOSEC) 20 MG capsule Take 20 mg by mouth daily.  [name] VERIO test strip USE AS DIRECTED FOUR TIMES DAILY  . PARoxetine (PAXIL) 20 MG tablet Take 20 mg by mouth daily.  . potassium chloride (K-DUR,KLOR-CON) 10 MEQ tablet Take 10 mEq by mouth daily.  . rosuvastatin (CRESTOR) 10 MG tablet TAKE ONE TABLET BY MOUTH ONCE DAILY (Patient not taking: Reported on 07/22/2016)  . tolterodine (DETROL LA) 4 MG 24 hr capsule Take 4 mg by mouth daily.  . traMADol (ULTRAM) 50 MG tablet Take 2 tablets (100 mg total) by mouth every 6 (six) hours as needed for moderate pain.  . vitamin B-12 (CYANOCOBALAMIN) 500 MCG tablet Take 500 mcg by mouth daily.  . Vitamin D, Ergocalciferol, (DRISDOL) 50000 UNITS CAPS capsule Take 50,000 Units by mouth every Monday.   . [DISCONTINUED] INVOKANA 100 MG TABS tablet TAKE ONE TABLET BY MOUTH DAILY (Patient not taking: Reported on 08/09/2016)   No facility-administered encounter medications on file as of 08/09/2016.  ALLERGIES: Allergies  Allergen Reactions  . Statins Other (See Comments)    Weak all over , muscle weakness   . Sulfa Antibiotics Rash and Other (See Comments)    Gets a fever    VACCINATION STATUS:  There is no immunization history on file for this patient.  Diabetes  She presents for her follow-up diabetic visit. She has type 2 diabetes mellitus. Onset time: Diagnosed at approximate age of 24 yrs. Her disease course has been worsening. There are no hypoglycemic associated symptoms. Pertinent negatives for hypoglycemia include no confusion, headaches, pallor or seizures. There are no diabetic associated symptoms. Pertinent negatives for diabetes  include no chest pain and no polyuria. Symptoms are worsening. There are no diabetic complications. Risk factors for coronary artery disease include dyslipidemia, hypertension and sedentary lifestyle. Her weight is decreasing steadily. She is following a generally unhealthy diet. Diabetic meal planning: Admits to dietary indiscretion. She drinks soda regularly. Home blood sugar record trend:  As usual, She did not bring the meter nor log to review today.  Hypertension  This is a chronic problem. The current episode started more than 1 year ago. Pertinent negatives include no chest pain, headaches, palpitations or shortness of breath.  Hyperlipidemia  This is a chronic problem. The current episode started more than 1 year ago. Pertinent negatives include no chest pain, myalgias or shortness of breath.     Review of Systems  Constitutional: Negative for unexpected weight change.  HENT: Negative for trouble swallowing and voice change.   Eyes: Negative for visual disturbance.  Respiratory: Negative for cough, shortness of breath and wheezing.   Cardiovascular: Negative for chest pain, palpitations and leg swelling.  Gastrointestinal: Negative for diarrhea, nausea and vomiting.  Endocrine: Negative for cold intolerance, heat intolerance and polyuria.  Musculoskeletal: Negative for arthralgias and myalgias.  Skin: Negative for color change, pallor, rash and wound.  Neurological: Negative for seizures and headaches.  Psychiatric/Behavioral: Negative for confusion and suicidal ideas.    Objective:    BP 111/72   Pulse 89   Ht 5\' 4"  (1.626 m)   Wt 194 lb (88 kg)   BMI 33.30 kg/m   Wt Readings from Last 3 Encounters:  08/09/16 194 lb (88 kg)  08/04/16 201 lb 4.5 oz (91.3 kg)  07/26/16 197 lb 3.2 oz (89.4 kg)    Physical Exam  Constitutional: She is oriented to person, place, and time. She appears well-developed.  HENT:  Head: Normocephalic and atraumatic.  Eyes: EOM are normal.  Neck:  Normal range of motion. Neck supple. No tracheal deviation present. No thyromegaly present.  Cardiovascular: Normal rate and regular rhythm.   Pulmonary/Chest: Effort normal and breath sounds normal.  Abdominal: Soft. Bowel sounds are normal. There is no tenderness. There is no guarding.  Musculoskeletal: Normal range of motion. She exhibits no edema.  Neurological: She is alert and oriented to person, place, and time. She has normal reflexes. No cranial nerve deficit. Coordination normal.  Skin: Skin is warm and dry. No rash noted. No erythema. No pallor.  Psychiatric: She has a normal mood and affect. Judgment normal.    Results for orders placed or performed in visit on 08/04/16  Lipid Panel  Result Value Ref Range   Cholesterol 266 (H) <200 mg/dL   Triglycerides 178 (H) <150 mg/dL   HDL 42 (L) >50 mg/dL   Total CHOL/HDL Ratio 6.3 (H) <5.0 Ratio   VLDL 36 (H) <30 mg/dL   LDL Cholesterol 188 (H) <100 mg/dL  Microalbumin/Creatinine Ratio, Urine  Result Value Ref Range   Creatinine, Urine 170 20 - 320 mg/dL   Microalb, Ur 1.2 Not estab mg/dL   Microalb Creat Ratio 7 <30 mcg/mg creat  Comprehensive metabolic panel  Result Value Ref Range   Sodium 140 135 - 146 mmol/L   Potassium 3.5 3.5 - 5.3 mmol/L   Chloride 97 (L) 98 - 110 mmol/L   CO2 26 20 - 31 mmol/L   Glucose, Bld 129 (H) 65 - 99 mg/dL   BUN 17 7 - 25 mg/dL   Creat 0.74 0.50 - 0.99 mg/dL   Total Bilirubin 0.5 0.2 - 1.2 mg/dL   Alkaline Phosphatase 53 33 - 130 U/L   AST 41 (H) 10 - 35 U/L   ALT 36 (H) 6 - 29 U/L   Total Protein 6.4 6.1 - 8.1 g/dL   Albumin 3.5 (L) 3.6 - 5.1 g/dL   Calcium 8.7 8.6 - 10.4 mg/dL   Complete Blood Count (Most recent): Lab Results  Component Value Date   WBC 6.9 07/26/2016   HGB 12.7 07/26/2016   HCT 39.4 07/26/2016   MCV 85.5 07/26/2016   PLT 240 07/26/2016   Chemistry (most recent): Lab Results  Component Value Date   NA 140 08/04/2016   K 3.5 08/04/2016   CL 97 (L) 08/04/2016    CO2 26 08/04/2016   BUN 17 08/04/2016   CREATININE 0.74 08/04/2016   Diabetic Labs (most recent): Lab Results  Component Value Date   HGBA1C 8.2 (H) 07/26/2016   HGBA1C 7.7 (H) 10/07/2015   HGBA1C 8.2 (H) 04/10/2015   Lipid profile (most recent): Lab Results  Component Value Date   TRIG 178 (H) 08/04/2016   CHOL 266 (H) 08/04/2016         Assessment & Plan:   1. Uncontrolled type 2 diabetes mellitus without complication, with long-term current use of insulin [facility])  Patient Once again came without her meter nor logs. Her A1c has  increased to 8.2% from  7.7% .   Recent labs reviewed. Patient remains at a high risk for more acute and chronic complications of diabetes which include CAD, CVA, CKD, retinopathy, and neuropathy. These are all discussed in detail with the patient. I have re-counseled the patient on diet management and weight loss, by adopting a carbohydrate restricted / protein rich  Diet. Patient is advised to stick to a routine mealtimes to eat 3 meals  a day and avoid unnecessary snacks ( to snack only to correct hypoglycemia). Patient is advised to eliminate simple carbs  from her diet including cakes desserts ice cream soda (  diet and regular) , sweet tea , Candies,  chips and cookies, artificial sweeteners,   and "sugar-free" products .  This will help patient to have stable blood glucose profile and potentially lose weight.  The patient  will be  scheduled with Jearld Fenton, RDN, CDE for individualized DM education. I have approached patient to continue on  intensive monitoring of blood glucose and insulin therapy, and patient agrees.  I will continue  her basal insulin Levemir 40 units daily at bedtime associated with strict monitoring of glucose  every morning. - Patient is warned not to take insulin without proper monitoring per orders. - She is urged to bring her meter and logs during each visit.  -Patient is encouraged to call clinic for blood  glucose levels less than 70 or above 200 mg /dl. -She is known to have intolerance to metformin -I  Switch her invokana to [place] 5 mg by mouth every day. Side effects and precautions discussed with her. -Target numbers for A1c, LDL, HDL, Triglycerides, Waist Circumference were discussed in detail.  2) HTN: Controlled. Continue current medications including ACEI lisinopril 20 mg.Marland Kitchen 3) HPL: LDL at above target levels, continue statins Crestor 21 mg.. 4)  Weight/Diet: CDE consult, exercise, and carbs information provided.  5) Chronic Care: -Patient is on ACEI and Statin medications and encouraged to continue to follow up with Ophthalmology, Podiatrist at least yearly or according to recommendations, and advised to stay away from smoking. I have recommended yearly flu vaccine and pneumonia vaccination at least every 5 years; moderate intensity exercise for up to 150 minutes weekly; and  sleep for at least 7 hours a day.   Patient to bring meter and  blood glucose logs during her next visit.  Follow up plan: Return in about 3 months (around 11/09/2016) for follow up with pre-visit labs, meter, and logs.  Glade Lloyd, MD Phone: [phone]  Fax: [phone]   08/09/2016, 5:02 PM

## 2016-08-09 NOTE — Patient Instructions (Signed)

## 2016-08-30 ENCOUNTER — Encounter (HOSPITAL_COMMUNITY): Payer: Self-pay | Admitting: Neurosurgery

## 2016-10-27 LAB — RENAL FUNCTION PANEL
Albumin: 3.8 g/dL (ref 3.6–5.1)
BUN: 17 mg/dL (ref 7–25)
CHLORIDE: 103 mmol/L (ref 98–110)
CO2: 31 mmol/L (ref 20–32)
CREATININE: 0.91 mg/dL (ref 0.50–0.99)
Calcium: 9.4 mg/dL (ref 8.6–10.4)
GLUCOSE: 129 mg/dL — AB (ref 65–99)
POTASSIUM: 3.5 mmol/L (ref 3.5–5.3)
Phosphorus: 3.8 mg/dL (ref 2.5–4.5)
Sodium: 141 mmol/L (ref 135–146)

## 2016-10-27 LAB — HEMOGLOBIN A1C
HEMOGLOBIN A1C: 7.1 %{Hb} — AB (ref <5.7)
Mean Plasma Glucose: 157 (calc)
eAG (mmol/L): 8.7 (calc)

## 2016-11-11 ENCOUNTER — Ambulatory Visit (INDEPENDENT_AMBULATORY_CARE_PROVIDER_SITE_OTHER): Payer: BLUE CROSS/BLUE SHIELD | Admitting: "Endocrinology

## 2016-11-11 ENCOUNTER — Encounter: Payer: Self-pay | Admitting: "Endocrinology

## 2016-11-11 VITALS — BP 117/75 | HR 87 | Ht 64.0 in | Wt 186.0 lb

## 2016-11-11 DIAGNOSIS — E1165 Type 2 diabetes mellitus with hyperglycemia: Secondary | ICD-10-CM | POA: Diagnosis not present

## 2016-11-11 DIAGNOSIS — I10 Essential (primary) hypertension: Secondary | ICD-10-CM

## 2016-11-11 DIAGNOSIS — Z6834 Body mass index (BMI) 34.0-34.9, adult: Secondary | ICD-10-CM

## 2016-11-11 DIAGNOSIS — IMO0001 Reserved for inherently not codable concepts without codable children: Secondary | ICD-10-CM

## 2016-11-11 DIAGNOSIS — E6609 Other obesity due to excess calories: Secondary | ICD-10-CM

## 2016-11-11 DIAGNOSIS — Z794 Long term (current) use of insulin: Secondary | ICD-10-CM

## 2016-11-11 DIAGNOSIS — E782 Mixed hyperlipidemia: Secondary | ICD-10-CM | POA: Diagnosis not present

## 2016-11-11 MED ORDER — ROSUVASTATIN CALCIUM 10 MG PO TABS: 10.0000 mg | ORAL_TABLET | Freq: Every day | ORAL | 3 refills | Status: DC

## 2016-11-11 MED ORDER — SITAGLIPTIN PHOSPHATE 25 MG PO TABS: 25.0000 mg | ORAL_TABLET | Freq: Every day | ORAL | 3 refills | Status: DC

## 2016-11-11 NOTE — Progress Notes (Signed)
Subjective:    Patient ID: Rebecca Roberson, female    DOB: 1950/08/14,    Past Medical History:  Diagnosis Date  . Arthritis   . Cervical myelopathy ([place]) 06/30/2016  . Diabetes mellitus without complication ([place])   . GERD (gastroesophageal reflux disease)   . Heart murmur   . History of kidney stones    has one now  . Hyperlipidemia   . Hypertension   . Sleep apnea    cpap have not used 3 yrs   Past Surgical History:  Procedure Laterality Date  . ANTERIOR CERVICAL DECOMP/DISCECTOMY FUSION N/A 07/30/2016   Procedure: ANTERIOR CERVICAL DECOMPRESSION/DISCECTOMY FUSION CERVICAL TWO-THREE;  Surgeon: Ashok Pall, MD;  Location: [facility];  Service: Neurosurgery;  Laterality: N/A;  ANTERIOR CERVICAL DECOMPRESSION/DISCECTOMY FUSION CERVICAL 2- CERVICAL 3  . CERVICAL CONE BIOPSY     Social History   Social History  . Marital status: Single    Spouse name: N/A  . Number of children: 0  . Years of education: 14   Occupational History  . [employer]    Social History Main Topics  . Smoking status: Never Smoker  . Smokeless tobacco: Never Used  . Alcohol use No  . Drug use: No  . Sexual activity: Not Asked   Other Topics Concern  . None   Social History Narrative   Lives alone   Caffeine use: Drinks soda (20oz per day)   Tea sometimes   Right-handed   Outpatient Encounter Prescriptions as of 11/11/2016  Medication Sig  . aspirin EC 81 MG tablet Take 81 mg by mouth daily.  . cyclobenzaprine (FLEXERIL) 10 MG tablet Take 10 mg by mouth at bedtime as needed for muscle spasms.   . fluticasone (FLONASE) 50 MCG/ACT nasal spray Place 2 sprays into both nostrils daily.  Marland Kitchen gabapentin (NEURONTIN) 300 MG capsule Take 300 mg by mouth as needed.   Marland Kitchen glucose blood (ONETOUCH VERIO) test strip USE AS DIRECTED FOUR TIMES DAILY  . ibuprofen (ADVIL,MOTRIN) 800 MG tablet Take 800 mg by mouth 3 (three) times daily as needed for moderate pain.   . Insulin Detemir (LEVEMIR FLEXPEN [place])  Inject 40 Units into the skin at bedtime.   Marland Kitchen lisinopril-hydrochlorothiazide (PRINZIDE,ZESTORETIC) 20-25 MG per tablet Take 1 tablet by mouth daily.  Marland Kitchen LITETOUCH PEN NEEDLES 31G X 8 MM MISC USE FOUR TIMES DAILY  . omeprazole (PRILOSEC) 20 MG capsule Take 20 mg by mouth daily.  [name] VERIO test strip USE AS DIRECTED FOUR TIMES DAILY  . PARoxetine (PAXIL) 20 MG tablet Take 20 mg by mouth daily.  . potassium chloride (K-DUR,KLOR-CON) 10 MEQ tablet Take 10 mEq by mouth daily.  . rosuvastatin (CRESTOR) 10 MG tablet Take 1 tablet (10 mg total) by mouth daily.  . sitaGLIPtin (JANUVIA) 25 MG tablet Take 1 tablet (25 mg total) by mouth daily.  Marland Kitchen tolterodine (DETROL LA) 4 MG 24 hr capsule Take 4 mg by mouth daily.  . traMADol (ULTRAM) 50 MG tablet Take 2 tablets (100 mg total) by mouth every 6 (six) hours as needed for moderate pain.  . vitamin B-12 (CYANOCOBALAMIN) 500 MCG tablet Take 500 mcg by mouth daily.  . Vitamin D, Ergocalciferol, (DRISDOL) 50000 UNITS CAPS capsule Take 50,000 Units by mouth every Monday.   . [DISCONTINUED] dapagliflozin propanediol (FARXIGA) 5 MG TABS tablet Take 5 mg by mouth daily.  . [DISCONTINUED] rosuvastatin (CRESTOR) 10 MG tablet TAKE ONE TABLET BY MOUTH ONCE DAILY (Patient not taking: Reported on  07/22/2016)   No facility-administered encounter medications on file as of 11/11/2016.    ALLERGIES: Allergies  Allergen Reactions  . Statins Other (See Comments)    Weak all over , muscle weakness   . Sulfa Antibiotics Rash and Other (See Comments)    Gets a fever    VACCINATION STATUS:  There is no immunization history on file for this patient.  Diabetes  She presents for her follow-up diabetic visit. She has type 2 diabetes mellitus. Onset time: Diagnosed at approximate age of 33 yrs. Her disease course has been improving. There are no hypoglycemic associated symptoms. Pertinent negatives for hypoglycemia include no confusion, headaches, pallor or seizures. There  are no diabetic associated symptoms. Pertinent negatives for diabetes include no chest pain and no polyuria. Symptoms are improving. There are no diabetic complications. Risk factors for coronary artery disease include dyslipidemia, hypertension and sedentary lifestyle. Current diabetic treatment includes insulin injections and oral agent (monotherapy). Her weight is decreasing steadily. She is following a generally unhealthy diet. Diabetic meal planning: Admits to dietary indiscretion. She drinks soda regularly. Her home blood glucose trend is decreasing steadily. Her breakfast blood glucose range is generally 140-180 mg/dl. Her overall blood glucose range is 140-180 mg/dl.  Hypertension  This is a chronic problem. The current episode started more than 1 year ago. Pertinent negatives include no chest pain, headaches, palpitations or shortness of breath.  Hyperlipidemia  This is a chronic problem. The current episode started more than 1 year ago. Pertinent negatives include no chest pain, myalgias or shortness of breath.     Review of Systems  Constitutional: Negative for unexpected weight change.  HENT: Negative for trouble swallowing and voice change.   Eyes: Negative for visual disturbance.  Respiratory: Negative for cough, shortness of breath and wheezing.   Cardiovascular: Negative for chest pain, palpitations and leg swelling.  Gastrointestinal: Negative for diarrhea, nausea and vomiting.  Endocrine: Negative for cold intolerance, heat intolerance and polyuria.  Musculoskeletal: Negative for arthralgias and myalgias.  Skin: Negative for color change, pallor, rash and wound.  Neurological: Negative for seizures and headaches.  Psychiatric/Behavioral: Negative for confusion and suicidal ideas.    Objective:    BP 117/75   Pulse 87   Ht 5\' 4"  (1.626 m)   Wt 186 lb (84.4 kg)   BMI 31.93 kg/m   Wt Readings from Last 3 Encounters:  11/11/16 186 lb (84.4 kg)  08/09/16 194 lb (88 kg)   08/04/16 201 lb 4.5 oz (91.3 kg)    Physical Exam  Constitutional: She is oriented to person, place, and time. She appears well-developed.  HENT:  Head: Normocephalic and atraumatic.  Eyes: EOM are normal.  Neck: Normal range of motion. Neck supple. No tracheal deviation present. No thyromegaly present.  Cardiovascular: Normal rate and regular rhythm.   Pulmonary/Chest: Effort normal and breath sounds normal.  Abdominal: Soft. Bowel sounds are normal. There is no tenderness. There is no guarding.  Musculoskeletal: Normal range of motion. She exhibits no edema.  Neurological: She is alert and oriented to person, place, and time. She has normal reflexes. No cranial nerve deficit. Coordination normal.  Skin: Skin is warm and dry. No rash noted. No erythema. No pallor.  Psychiatric: She has a normal mood and affect. Judgment normal.    Results for orders placed or performed in visit on 08/09/16  Renal function panel  Result Value Ref Range   Glucose, Bld 129 (H) 65 - 99 mg/dL   BUN 17 7 -  25 mg/dL   Creat 0.91 0.50 - 0.99 mg/dL   BUN/Creatinine Ratio NOT APPLICABLE 6 - 22 (calc)   Sodium 141 135 - 146 mmol/L   Potassium 3.5 3.5 - 5.3 mmol/L   Chloride 103 98 - 110 mmol/L   CO2 31 20 - 32 mmol/L   Calcium 9.4 8.6 - 10.4 mg/dL   Phosphorus 3.8 2.5 - 4.5 mg/dL   Albumin 3.8 3.6 - 5.1 g/dL  Hemoglobin A1c  Result Value Ref Range   Hgb A1c MFr Bld 7.1 (H) <5.7 % of total Hgb   Mean Plasma Glucose 157 (calc)   eAG (mmol/L) 8.7 (calc)   Complete Blood Count (Most recent): Lab Results  Component Value Date   WBC 6.9 07/26/2016   HGB 12.7 07/26/2016   HCT 39.4 07/26/2016   MCV 85.5 07/26/2016   PLT 240 07/26/2016   Chemistry (most recent): Lab Results  Component Value Date   NA 141 10/26/2016   K 3.5 10/26/2016   CL 103 10/26/2016   CO2 31 10/26/2016   BUN 17 10/26/2016   CREATININE 0.91 10/26/2016   Diabetic Labs (most recent): Lab Results  Component Value Date    HGBA1C 7.1 (H) 10/26/2016   HGBA1C 8.2 (H) 07/26/2016   HGBA1C 7.7 (H) 10/07/2015   Lipid profile (most recent): Lab Results  Component Value Date   TRIG 178 (H) 08/04/2016   CHOL 266 (H) 08/04/2016         Assessment & Plan:   1. Uncontrolled type 2 diabetes mellitus without complication, with long-term current use of insulin ([place])  Patient  Came with improved blood glucose profile, A1c improving to 7.1% from 8.2%.   Recent labs reviewed. Patient remains at a high risk for more acute and chronic complications of diabetes which include CAD, CVA, CKD, retinopathy, and neuropathy. These are all discussed in detail with the patient. I have re-counseled the patient on diet management and weight loss, by adopting a carbohydrate restricted / protein rich  Diet.  -  Suggestion is made for her to avoid simple carbohydrates  from her diet including Cakes, Sweet Desserts / Pastries, Ice Cream, Soda (diet and regular), Sweet Tea, Candies, Chips, Cookies, Store Bought Juices, Alcohol in Excess of  1-2 drinks a day, Artificial Sweeteners, and "Sugar-free" Products. This will help patient to have stable blood glucose profile and potentially avoid unintended weight gain.   I have approached patient to continue on  intensive monitoring of blood glucose and insulin therapy, and patient agrees.  I will continue  her basal insulin Levemir 40 units daily at bedtime associated with strict monitoring of glucose  every morning. - Patient is warned not to take insulin without proper monitoring per orders. - She is urged to bring her meter and logs during each visit.  -Patient is encouraged to call clinic for blood glucose levels less than 70 or above 200 mg /dl. -She is known to have intolerance to metformin. -I will  Discontinue Wilder Glade, will prescribe Januvia 25 mg by mouth every morning with breakfast. Side effects and precautions discussed with her.   -Target numbers for A1c, LDL, HDL,  Triglycerides, Waist Circumference were discussed in detail.  2) HTN: Controlled.  Continue current medications  including ACEI lisinopril 20 mg.Marland Kitchen 3) HPL: LDL at above target levels 188, patient has interrupted her Crestor therapy for unclear reasons. I urged her to resume Crestor 10 mg. side effects and precautions discussed with her.    4)  Weight/Diet: CDE consult,  exercise, and carbs information provided.  5) Chronic Care: -Patient is on ACEI and Statin medications and encouraged to continue to follow up with Ophthalmology, Podiatrist at least yearly or according to recommendations, and advised to stay away from smoking. I have recommended yearly flu vaccine and pneumonia vaccination at least every 5 years; moderate intensity exercise for up to 150 minutes weekly; and  sleep for at least 7 hours a day.  - Time spent with the patient: 25 min, of which >50% was spent in reviewing her sugar logs , discussing her hypo- and hyper-glycemic episodes, reviewing her current and  previous labs and insulin doses and developing a plan to avoid hypo- and hyper-glycemia.    Follow up plan: Return in about 3 months (around 02/11/2017) for follow up with pre-visit labs, meter, and logs.  Glade Lloyd, MD Phone: [phone]  Fax: [phone]  -  This note was partially dictated with voice recognition software. Similar sounding words can be transcribed inadequately or may not  be corrected upon review.  11/11/2016, 3:48 PM

## 2016-11-11 NOTE — Patient Instructions (Signed)

## 2016-11-22 ENCOUNTER — Other Ambulatory Visit: Payer: Self-pay | Admitting: "Endocrinology

## 2016-11-22 ENCOUNTER — Other Ambulatory Visit: Payer: Self-pay | Admitting: Family Medicine

## 2017-01-06 ENCOUNTER — Encounter (INDEPENDENT_AMBULATORY_CARE_PROVIDER_SITE_OTHER): Payer: Self-pay

## 2017-01-06 ENCOUNTER — Encounter: Payer: Self-pay | Admitting: Neurology

## 2017-01-06 ENCOUNTER — Ambulatory Visit (INDEPENDENT_AMBULATORY_CARE_PROVIDER_SITE_OTHER): Payer: BLUE CROSS/BLUE SHIELD | Admitting: Neurology

## 2017-01-06 VITALS — BP 118/74 | HR 83 | Wt 190.5 lb

## 2017-01-06 DIAGNOSIS — G959 Disease of spinal cord, unspecified: Secondary | ICD-10-CM | POA: Diagnosis not present

## 2017-01-06 DIAGNOSIS — R42 Dizziness and giddiness: Secondary | ICD-10-CM

## 2017-01-06 DIAGNOSIS — H81399 Other peripheral vertigo, unspecified ear: Secondary | ICD-10-CM | POA: Diagnosis not present

## 2017-01-06 NOTE — Patient Instructions (Signed)
   We will check MRI of the brain and a carotid doppler to evaluate the dizziness.

## 2017-01-06 NOTE — Progress Notes (Signed)
Reason for visit: Cervical myelopathy  Rebecca Roberson is an 66 y.o. female  History of present illness:  Rebecca Roberson is a 66 year old right-handed black female with a history of some discomfort and paresthesias into the left greater than right arm and in both legs.  The patient was found to have a relatively large disc herniation at the C2-3 level with spinal cord compression and high cord signal.  The patient was sent to Dr. Christella Noa and the patient underwent decompressive surgery.  The patient has done well following surgery, she initially had improvement in the numbness and discomfort in the left arm and hand, but this has since returned.  The patient wears a compression glove which seems to help the discomfort.  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  The patient started 2 medications in October 2018 to include Crestor and Januvia.  Over the last 3 or 4 weeks the patient has developed episodes of brief dizziness lasting less than a minute.  She has some discomfort in the left ear as well.  She does report some sinus drainage at times.  The episodes of dizziness occur several times a day.  She denies any other numbness or weakness of the face, arms, or legs that is new.  She denies any significant changes in balance, she denies any blackouts or falls.  She returns to this office for an evaluation.  Past Medical History:  Diagnosis Date  . Arthritis   . Cervical myelopathy ([place]) 06/30/2016  . Diabetes mellitus without complication ([place])   . GERD (gastroesophageal reflux disease)   . Heart murmur   . History of kidney stones    has one now  . Hyperlipidemia   . Hypertension   . Sleep apnea    cpap have not used 3 yrs    Past Surgical History:  Procedure Laterality Date  . ANTERIOR CERVICAL DECOMP/DISCECTOMY FUSION N/A 07/30/2016   Procedure: ANTERIOR CERVICAL DECOMPRESSION/DISCECTOMY FUSION CERVICAL TWO-THREE;  Surgeon: Ashok Pall, MD;  Location: [facility];  Service:  Neurosurgery;  Laterality: N/A;  ANTERIOR CERVICAL DECOMPRESSION/DISCECTOMY FUSION CERVICAL 2- CERVICAL 3  . CERVICAL CONE BIOPSY      Family History  Problem Relation Age of Onset  . Kidney disease Father   . Thyroid disease Father     Social history:  reports that  has never smoked. she has never used smokeless tobacco. She reports that she does not drink alcohol or use drugs.    Allergies  Allergen Reactions  . Statins Other (See Comments)    Weak all over , muscle weakness   . Sulfa Antibiotics Rash and Other (See Comments)    Gets a fever     Medications:  Prior to Admission medications   Medication Sig Start Date End Date Taking? Authorizing Provider  aspirin EC 81 MG tablet Take 81 mg by mouth daily.   Yes [provider]  cyclobenzaprine (FLEXERIL) 10 MG tablet Take 10 mg by mouth at bedtime as needed for muscle spasms.    Yes [provider]  fluticasone (FLONASE) 50 MCG/ACT nasal spray Place 2 sprays into both nostrils daily. 06/23/16  Yes [provider]  gabapentin (NEURONTIN) 300 MG capsule Take 300 mg by mouth as needed.    Yes [provider]  glucose blood (ONETOUCH VERIO) test strip USE AS DIRECTED FOUR TIMES DAILY 04/17/15  Yes Nida, Marella Chimes, MD  ibuprofen (ADVIL,MOTRIN) 800 MG tablet Take 800 mg by mouth 3 (three) times  daily as needed for moderate pain.    Yes [provider]  Insulin Detemir (LEVEMIR FLEXPEN [place]) Inject 40 Units into the skin at bedtime.    Yes [provider]  lisinopril-hydrochlorothiazide (PRINZIDE,ZESTORETIC) 20-25 MG per tablet Take 1 tablet by mouth daily.   Yes [provider]  LITETOUCH PEN NEEDLES 31G X 8 MM MISC USE FOUR TIMES DAILY 11/25/15  Yes Nida, Marella Chimes, MD  omeprazole (PRILOSEC) 20 MG capsule Take 20 mg by mouth daily.   Yes [provider]  ONETOUCH VERIO test strip USE TO TEST BLOOD SUGAR FOUR TIMES DAILY 11/25/16  Yes Fayrene Helper, MD    PARoxetine (PAXIL) 20 MG tablet Take 20 mg by mouth daily.   Yes [provider]  potassium chloride (K-DUR,KLOR-CON) 10 MEQ tablet Take 10 mEq by mouth daily. 06/23/16  Yes [provider]  rosuvastatin (CRESTOR) 10 MG tablet Take 1 tablet (10 mg total) by mouth daily. 11/11/16  Yes Nida, Marella Chimes, MD  sitaGLIPtin (JANUVIA) 25 MG tablet Take 1 tablet (25 mg total) by mouth daily. 11/11/16  Yes Nida, Marella Chimes, MD  tolterodine (DETROL LA) 4 MG 24 hr capsule Take 4 mg by mouth daily.   Yes [provider]  traMADol (ULTRAM) 50 MG tablet Take 2 tablets (100 mg total) by mouth every 6 (six) hours as needed for moderate pain. 08/03/16  Yes Ashok Pall, MD  vitamin B-12 (CYANOCOBALAMIN) 500 MCG tablet Take 500 mcg by mouth daily.   Yes [provider]  Vitamin D, Ergocalciferol, (DRISDOL) 50000 UNITS CAPS capsule Take 50,000 Units by mouth every Monday.    Yes [provider]    ROS:  Out of a complete 14 system review of symptoms, the patient complains only of the following symptoms, and all other reviewed systems are negative.  Ear pain Blurred vision Chest tightness Heart murmur Sleep apnea, frequent waking, snoring Incontinence of the bladder Joint pain, back pain, aching muscles, muscle cramps, neck pain, neck stiffness Dizziness, headache, numbness  Blood pressure 118/74, pulse 83, weight 190 lb 8 oz (86.4 kg).  Physical Exam  General: The patient is alert and cooperative at the time of the examination.  The patient is moderately obese.  Ears: Tympanic membranes are clear.  Respiratory: Lung fields are clear.  Cardiovascular: Regular rate and rhythm, no murmurs or rubs are noted.  Neck: Neck is supple, no carotid bruits are noted.  Skin: No significant peripheral edema is noted.   Neurologic Exam  Mental status: The patient is alert and oriented x 3 at the time of the examination. The patient has apparent normal  recent and remote memory, with an apparently normal attention span and concentration ability.   Cranial nerves: Facial symmetry is present. Speech is normal, no aphasia or dysarthria is noted. Extraocular movements are full. Visual fields are full.  Motor: The patient has good strength in all 4 extremities.  Sensory examination: Soft touch sensation is symmetric on the face, arms, and legs.  Coordination: The patient has good finger-nose-finger and heel-to-shin bilaterally.  Gait and station: The patient has a normal gait. Tandem gait is slightly unsteady. Romberg is negative. No drift is seen.  Reflexes: Deep tendon reflexes are symmetric.   MRI cervical 07/07/16:  IMPRESSION: Interval development of moderately large extruded disc fragment at C2-3 with downgoing disc material. There is cord compression and cord hyperintensity.  Degenerative changes throughout the remainder of the cervical spine are stable. Mild cord hyperintensity at C5-6  unchanged.  * MRI scan images were reviewed online. I agree with the written report.    Assessment/Plan:  1.  Cervical myelopathy, status post decompression  2.  Episodic dizziness, new onset  The patient is noting some left ear pain and episodic dizziness.  The patient will undergo a carotid Doppler study and a MRI of the brain.  The patient will follow-up in about 6 months, I will review the results of the above workup with the patient once they are available to me.  Jill Alexanders MD 01/06/2017 2:33 PM  [facility] [address] [place], [place] [address]  Phone [phone] Fax [phone]

## 2017-01-13 ENCOUNTER — Telehealth: Payer: Self-pay | Admitting: Neurology

## 2017-01-13 ENCOUNTER — Ambulatory Visit (HOSPITAL_COMMUNITY)
Admission: RE | Admit: 2017-01-13 | Discharge: 2017-01-13 | Disposition: A | Discharge status: Home / Self Care | Payer: BLUE CROSS/BLUE SHIELD | Source: Ambulatory Visit | Attending: Neurology | Admitting: Neurology

## 2017-01-13 DIAGNOSIS — R9089 Other abnormal findings on diagnostic imaging of central nervous system: Secondary | ICD-10-CM | POA: Diagnosis not present

## 2017-01-13 DIAGNOSIS — R42 Dizziness and giddiness: Secondary | ICD-10-CM

## 2017-01-13 DIAGNOSIS — H81399 Other peripheral vertigo, unspecified ear: Secondary | ICD-10-CM

## 2017-01-13 NOTE — Telephone Encounter (Signed)
I called the patient.  MRI of the brain is unremarkable, no evidence of significant small vessel disease.  The carotid Doppler study is pending.  The patient is felt better with the dizziness over the last couple days, possibly could be related to the new medications started around the time that the dizziness began.    MRI brain 01/13/17:  IMPRESSION: No acute finding. No specific cause of the presenting symptoms is identified. Chronic small-vessel ischemic changes of the pons and the cerebral hemispheric white matter.

## 2017-01-28 ENCOUNTER — Ambulatory Visit: Payer: BLUE CROSS/BLUE SHIELD | Admitting: Urology

## 2017-02-02 ENCOUNTER — Ambulatory Visit (HOSPITAL_COMMUNITY)
Admission: RE | Admit: 2017-02-02 | Discharge: 2017-02-02 | Disposition: A | Discharge status: Home / Self Care | Payer: BLUE CROSS/BLUE SHIELD | Source: Ambulatory Visit | Attending: Neurology | Admitting: Neurology

## 2017-02-02 ENCOUNTER — Telehealth: Payer: Self-pay | Admitting: Neurology

## 2017-02-02 DIAGNOSIS — I6523 Occlusion and stenosis of bilateral carotid arteries: Secondary | ICD-10-CM | POA: Diagnosis not present

## 2017-02-02 DIAGNOSIS — H81399 Other peripheral vertigo, unspecified ear: Secondary | ICD-10-CM | POA: Diagnosis not present

## 2017-02-02 DIAGNOSIS — R42 Dizziness and giddiness: Secondary | ICD-10-CM | POA: Diagnosis not present

## 2017-02-02 NOTE — Progress Notes (Signed)
Carotid artery duplex has been completed. 1-39% ICA stenosis bilaterally.  02/02/17 9:30 AM Rebecca Roberson RVT

## 2017-02-02 NOTE — Telephone Encounter (Signed)
  I called the patient.  The carotid Doppler studies were unremarkable, MRI of the brain also was unrevealing as to the cause of her dizziness.  The patient believes it is related to using an old pair of eyeglasses, she will see her eye doctor.   Carotid doppler 02/03/16:  Final Interpretation: Right Carotid: There is evidence in the right ICA of a 1-39% stenosis.  Left Carotid: There is evidence in the left ICA of a 1-39% stenosis.  Vertebrals: Both vertebral arteries were patent with antegrade flow.

## 2017-02-14 ENCOUNTER — Ambulatory Visit: Payer: BLUE CROSS/BLUE SHIELD | Admitting: "Endocrinology

## 2017-02-21 ENCOUNTER — Other Ambulatory Visit: Payer: Self-pay | Admitting: "Endocrinology

## 2017-02-21 DIAGNOSIS — E1165 Type 2 diabetes mellitus with hyperglycemia: Secondary | ICD-10-CM

## 2017-02-22 ENCOUNTER — Other Ambulatory Visit: Payer: Self-pay | Admitting: "Endocrinology

## 2017-02-22 ENCOUNTER — Ambulatory Visit (HOSPITAL_COMMUNITY)
Admission: RE | Admit: 2017-02-22 | Discharge: 2017-02-22 | Disposition: A | Discharge status: Home / Self Care | Payer: BLUE CROSS/BLUE SHIELD | Source: Ambulatory Visit | Attending: Urology | Admitting: Urology

## 2017-02-22 ENCOUNTER — Other Ambulatory Visit: Payer: Self-pay | Admitting: Urology

## 2017-02-22 DIAGNOSIS — N200 Calculus of kidney: Secondary | ICD-10-CM | POA: Insufficient documentation

## 2017-02-22 DIAGNOSIS — D259 Leiomyoma of uterus, unspecified: Secondary | ICD-10-CM | POA: Diagnosis not present

## 2017-02-22 LAB — RENAL FUNCTION PANEL
ALBUMIN MSPROF: 4.2 g/dL (ref 3.6–5.1)
BUN: 14 mg/dL (ref 7–25)
CO2: 33 mmol/L — ABNORMAL HIGH (ref 20–32)
Calcium: 9.3 mg/dL (ref 8.6–10.4)
Chloride: 103 mmol/L (ref 98–110)
Creat: 0.76 mg/dL (ref 0.50–0.99)
Glucose, Bld: 158 mg/dL — ABNORMAL HIGH (ref 65–99)
Phosphorus: 3.8 mg/dL (ref 2.5–4.5)
Potassium: 3.7 mmol/L (ref 3.5–5.3)
SODIUM: 142 mmol/L (ref 135–146)

## 2017-02-22 LAB — HEMOGLOBIN A1C
EAG (MMOL/L): 9.7 (calc)
Hgb A1c MFr Bld: 7.7 % of total Hgb — ABNORMAL HIGH (ref <5.7)
Mean Plasma Glucose: 174 (calc)

## 2017-02-25 ENCOUNTER — Ambulatory Visit: Payer: BLUE CROSS/BLUE SHIELD | Admitting: Urology

## 2017-02-25 ENCOUNTER — Ambulatory Visit (INDEPENDENT_AMBULATORY_CARE_PROVIDER_SITE_OTHER): Payer: BLUE CROSS/BLUE SHIELD | Admitting: Urology

## 2017-02-25 DIAGNOSIS — N200 Calculus of kidney: Secondary | ICD-10-CM

## 2017-03-03 ENCOUNTER — Encounter: Payer: Self-pay | Admitting: "Endocrinology

## 2017-03-03 ENCOUNTER — Ambulatory Visit (INDEPENDENT_AMBULATORY_CARE_PROVIDER_SITE_OTHER): Payer: BLUE CROSS/BLUE SHIELD | Admitting: "Endocrinology

## 2017-03-03 VITALS — BP 116/78 | HR 80 | Ht 64.0 in | Wt 198.0 lb

## 2017-03-03 DIAGNOSIS — E782 Mixed hyperlipidemia: Secondary | ICD-10-CM | POA: Diagnosis not present

## 2017-03-03 DIAGNOSIS — E1165 Type 2 diabetes mellitus with hyperglycemia: Secondary | ICD-10-CM | POA: Diagnosis not present

## 2017-03-03 DIAGNOSIS — E6609 Other obesity due to excess calories: Secondary | ICD-10-CM

## 2017-03-03 DIAGNOSIS — IMO0001 Reserved for inherently not codable concepts without codable children: Secondary | ICD-10-CM

## 2017-03-03 DIAGNOSIS — I10 Essential (primary) hypertension: Secondary | ICD-10-CM

## 2017-03-03 DIAGNOSIS — Z6834 Body mass index (BMI) 34.0-34.9, adult: Secondary | ICD-10-CM

## 2017-03-03 DIAGNOSIS — Z794 Long term (current) use of insulin: Secondary | ICD-10-CM | POA: Diagnosis not present

## 2017-03-03 MED ORDER — INSULIN DEGLUDEC 100 UNIT/ML ~~LOC~~ SOPN: 50.0000 [IU] | PEN_INJECTOR | Freq: Every day | SUBCUTANEOUS | 2 refills | Status: DC

## 2017-03-03 NOTE — Patient Instructions (Signed)

## 2017-03-03 NOTE — Progress Notes (Signed)
Subjective:    Patient ID: Rebecca Roberson, female    DOB: 11/18/1950,    Past Medical History:  Diagnosis Date  . Arthritis   . Cervical myelopathy ([place]) 06/30/2016  . Diabetes mellitus without complication ([place])   . GERD (gastroesophageal reflux disease)   . Heart murmur   . History of kidney stones    has one now  . Hyperlipidemia   . Hypertension   . Sleep apnea    cpap have not used 3 yrs   Past Surgical History:  Procedure Laterality Date  . ANTERIOR CERVICAL DECOMP/DISCECTOMY FUSION N/A 07/30/2016   Procedure: ANTERIOR CERVICAL DECOMPRESSION/DISCECTOMY FUSION CERVICAL TWO-THREE;  Surgeon: Ashok Pall, MD;  Location: [facility];  Service: Neurosurgery;  Laterality: N/A;  ANTERIOR CERVICAL DECOMPRESSION/DISCECTOMY FUSION CERVICAL 2- CERVICAL 3  . CERVICAL CONE BIOPSY     Social History   Socioeconomic History  . Marital status: Single    Spouse name: None  . Number of children: 0  . Years of education: 40  . Highest education level: None  Social Needs  . Financial resource strain: None  . Food insecurity - worry: None  . Food insecurity - inability: None  . Transportation needs - medical: None  . Transportation needs - non-medical: None  Occupational History  . Occupation: [employer]  Tobacco Use  . Smoking status: Never Smoker  . Smokeless tobacco: Never Used  Substance and Sexual Activity  . Alcohol use: No    Alcohol/week: 0.0 oz  . Drug use: No  . Sexual activity: None  Other Topics Concern  . None  Social History Narrative   Lives alone   Caffeine use: Drinks soda (20oz per day)   Tea sometimes   Right-handed   Outpatient Encounter Medications as of 03/03/2017  Medication Sig  . aspirin EC 81 MG tablet Take 81 mg by mouth daily.  . cyclobenzaprine (FLEXERIL) 10 MG tablet Take 10 mg by mouth at bedtime as needed for muscle spasms.   . fluticasone (FLONASE) 50 MCG/ACT nasal spray Place 2 sprays into both nostrils daily.  Marland Kitchen gabapentin  (NEURONTIN) 300 MG capsule Take 300 mg by mouth as needed.   Marland Kitchen glucose blood (ONETOUCH VERIO) test strip USE AS DIRECTED FOUR TIMES DAILY  . ibuprofen (ADVIL,MOTRIN) 800 MG tablet Take 800 mg by mouth 3 (three) times daily as needed for moderate pain.   Marland Kitchen insulin degludec (TRESIBA FLEXTOUCH) 100 UNIT/ML SOPN FlexTouch Pen Inject 0.5 mLs (50 Units total) into the skin daily at 10 pm.  . JANUVIA 25 MG tablet TAKE ONE TABLET BY MOUTH DAILY  . lisinopril-hydrochlorothiazide (PRINZIDE,ZESTORETIC) 20-25 MG per tablet Take 1 tablet by mouth daily.  Marland Kitchen LITETOUCH PEN NEEDLES 31G X 8 MM MISC USE FOUR TIMES DAILY  . omeprazole (PRILOSEC) 20 MG capsule Take 20 mg by mouth daily.  [name] VERIO test strip USE TO TEST BLOOD SUGAR FOUR TIMES DAILY  . PARoxetine (PAXIL) 20 MG tablet Take 20 mg by mouth daily.  . potassium chloride (K-DUR,KLOR-CON) 10 MEQ tablet Take 10 mEq by mouth daily.  . rosuvastatin (CRESTOR) 10 MG tablet TAKE ONE TABLET BY MOUTH DAILY  . tolterodine (DETROL LA) 4 MG 24 hr capsule Take 4 mg by mouth daily.  . traMADol (ULTRAM) 50 MG tablet Take 2 tablets (100 mg total) by mouth every 6 (six) hours as needed for moderate pain.  . vitamin B-12 (CYANOCOBALAMIN) 500 MCG tablet Take 500 mcg by mouth daily.  . Vitamin D,  Ergocalciferol, (DRISDOL) 50000 UNITS CAPS capsule Take 50,000 Units by mouth every Monday.   . [DISCONTINUED] Insulin Detemir (LEVEMIR FLEXPEN [place]) Inject 40 Units into the skin at bedtime.    No facility-administered encounter medications on file as of 03/03/2017.    ALLERGIES: Allergies  Allergen Reactions  . Statins Other (See Comments)    Weak all over , muscle weakness   . Sulfa Antibiotics Rash and Other (See Comments)    Gets a fever    VACCINATION STATUS:  There is no immunization history on file for this patient.  Diabetes  She presents for her follow-up diabetic visit. She has type 2 diabetes mellitus. Onset time: Diagnosed at approximate age of 29 yrs. Her  disease course has been worsening. There are no hypoglycemic associated symptoms. Pertinent negatives for hypoglycemia include no confusion, headaches, pallor or seizures. There are no diabetic associated symptoms. Pertinent negatives for diabetes include no chest pain and no polyuria. Symptoms are worsening. There are no diabetic complications. Risk factors for coronary artery disease include dyslipidemia, hypertension and sedentary lifestyle. Current diabetic treatment includes insulin injections and oral agent (monotherapy). Her weight is decreasing steadily. She is following a generally unhealthy diet. Diabetic meal planning: Admits to dietary indiscretion. She drinks soda regularly. Her home blood glucose trend is decreasing steadily. Her breakfast blood glucose range is generally 140-180 mg/dl. Her overall blood glucose range is 140-180 mg/dl.  Hypertension  This is a chronic problem. The current episode started more than 1 year ago. Pertinent negatives include no chest pain, headaches, palpitations or shortness of breath. Risk factors for coronary artery disease include family history, dyslipidemia, sedentary lifestyle, diabetes mellitus and post-menopausal state.  Hyperlipidemia  This is a chronic problem. The current episode started more than 1 year ago. Exacerbating diseases include diabetes and obesity. Pertinent negatives include no chest pain, myalgias or shortness of breath. Current antihyperlipidemic treatment includes statins. Risk factors for coronary artery disease include diabetes mellitus, dyslipidemia, hypertension, obesity, post-menopausal, a sedentary lifestyle and family history.     Review of Systems  Constitutional: Negative for unexpected weight change.  HENT: Negative for trouble swallowing and voice change.   Eyes: Negative for visual disturbance.  Respiratory: Negative for cough, shortness of breath and wheezing.   Cardiovascular: Negative for chest pain, palpitations and  leg swelling.  Gastrointestinal: Negative for diarrhea, nausea and vomiting.  Endocrine: Negative for cold intolerance, heat intolerance and polyuria.  Musculoskeletal: Negative for arthralgias and myalgias.  Skin: Negative for color change, pallor, rash and wound.  Neurological: Negative for seizures and headaches.  Psychiatric/Behavioral: Negative for confusion and suicidal ideas.    Objective:    BP 116/78   Pulse 80   Ht 5\' 4"  (1.626 m)   Wt 198 lb (89.8 kg)   BMI 33.99 kg/m   Wt Readings from Last 3 Encounters:  03/03/17 198 lb (89.8 kg)  01/06/17 190 lb 8 oz (86.4 kg)  11/11/16 186 lb (84.4 kg)    Physical Exam  Constitutional: She is oriented to person, place, and time. She appears well-developed.  HENT:  Head: Normocephalic and atraumatic.  Eyes: EOM are normal.  Neck: Normal range of motion. Neck supple. No tracheal deviation present. No thyromegaly present.  Cardiovascular: Normal rate and regular rhythm.  Pulmonary/Chest: Effort normal and breath sounds normal.  Abdominal: Soft. Bowel sounds are normal. There is no tenderness. There is no guarding.  Musculoskeletal: Normal range of motion. She exhibits no edema.  Neurological: She is alert and oriented to  person, place, and time. She has normal reflexes. No cranial nerve deficit. Coordination normal.  Skin: Skin is warm and dry. No rash noted. No erythema. No pallor.  Psychiatric: She has a normal mood and affect. Judgment normal.    Results for orders placed or performed in visit on 02/21/17  Renal Function Panel  Result Value Ref Range   Glucose, Bld 158 (H) 65 - 99 mg/dL   BUN 14 7 - 25 mg/dL   Creat 0.76 0.50 - 0.99 mg/dL   BUN/Creatinine Ratio NOT APPLICABLE 6 - 22 (calc)   Sodium 142 135 - 146 mmol/L   Potassium 3.7 3.5 - 5.3 mmol/L   Chloride 103 98 - 110 mmol/L   CO2 33 (H) 20 - 32 mmol/L   Calcium 9.3 8.6 - 10.4 mg/dL   Phosphorus 3.8 2.5 - 4.5 mg/dL   Albumin 4.2 3.6 - 5.1 g/dL  Hemoglobin A1c   Result Value Ref Range   Hgb A1c MFr Bld 7.7 (H) <5.7 % of total Hgb   Mean Plasma Glucose 174 (calc)   eAG (mmol/L) 9.7 (calc)   Complete Blood Count (Most recent): Lab Results  Component Value Date   WBC 6.9 07/26/2016   HGB 12.7 07/26/2016   HCT 39.4 07/26/2016   MCV 85.5 07/26/2016   PLT 240 07/26/2016   Chemistry (most recent): Lab Results  Component Value Date   NA 142 02/21/2017   K 3.7 02/21/2017   CL 103 02/21/2017   CO2 33 (H) 02/21/2017   BUN 14 02/21/2017   CREATININE 0.76 02/21/2017   Diabetic Labs (most recent): Lab Results  Component Value Date   HGBA1C 7.7 (H) 02/21/2017   HGBA1C 7.1 (H) 10/26/2016   HGBA1C 8.2 (H) 07/26/2016   Lipid Panel     Component Value Date/Time   CHOL 266 (H) 08/04/2016 1157   TRIG 178 (H) 08/04/2016 1157   HDL 42 (L) 08/04/2016 1157   CHOLHDL 6.3 (H) 08/04/2016 1157   VLDL 36 (H) 08/04/2016 1157   LDLCALC 188 (H) 08/04/2016 1157     Assessment & Plan:   1. Uncontrolled type 2 diabetes mellitus without complication, with long-term current use of insulin ([place])  Patient  came with above target fasting blood glucose profile.  Her A1c has increased to 7.7% from 7.1%.     Recent labs reviewed. Patient remains at a high risk for more acute and chronic complications of diabetes which include CAD, CVA, CKD, retinopathy, and neuropathy. These are all discussed in detail with the patient. I have re-counseled the patient on diet management and weight loss, by adopting a carbohydrate restricted / protein rich  Diet.  -  Suggestion is made for her to avoid simple carbohydrates  from her diet including Cakes, Sweet Desserts / Pastries, Ice Cream, Soda (diet and regular), Sweet Tea, Candies, Chips, Cookies, Store Bought Juices, Alcohol in Excess of  1-2 drinks a day, Artificial Sweeteners, and "Sugar-free" Products. This will help patient to have stable blood glucose profile and potentially avoid unintended weight gain.   I have  approached patient to continue on  intensive monitoring of blood glucose and insulin therapy, and patient agrees.  I will increase her Levemir to 50 units daily at bedtime ( will switch to [place] )  associated with strict monitoring of glucose  every morning. - Patient is warned not to take insulin without proper monitoring per orders. - She is urged to bring her meter and logs during each visit.  -Patient is  encouraged to call clinic for blood glucose levels less than 70 or above 200 mg /dl. -She is known to have intolerance to metformin. -I advised her to continue Januvia 25 mg by mouth every morning with breakfast. Side effects and precautions discussed with her.   -Target numbers for A1c, LDL, HDL, Triglycerides, Waist Circumference were discussed in detail.  2) HTN: Her blood pressure is controlled to target.    Continue current medications  including ACEI lisinopril 20 mg.Marland Kitchen 3) HPL: LDL controlled at 188, patient reports more consistency taking her Crestor, she is advised to stay on Crestor 10 mg p.o. nightly.  side effects and precautions discussed with her.    4)  Weight/Diet: CDE consult, exercise, and carbs information provided.  5) Chronic Care: -Patient is on ACEI and Statin medications and encouraged to continue to follow up with Ophthalmology, Podiatrist at least yearly or according to recommendations, and advised to stay away from smoking. I have recommended yearly flu vaccine and pneumonia vaccination at least every 5 years; moderate intensity exercise for up to 150 minutes weekly; and  sleep for at least 7 hours a day.  - Time spent with the patient: 25 min, of which >50% was spent in reviewing her blood glucose logs , discussing her hypo- and hyper-glycemic episodes, reviewing her current and  previous labs and insulin doses and developing a plan to avoid hypo- and hyper-glycemia. Please refer to Patient Instructions for Blood Glucose Monitoring and Insulin/Medications Dosing  Guide"  in media tab for additional information.   Follow up plan: Return in about 3 months (around 05/31/2017) for follow up with pre-visit labs, meter, and logs.  Glade Lloyd, MD Phone: [phone]  Fax: [phone]  -  This note was partially dictated with voice recognition software. Similar sounding words can be transcribed inadequately or may not  be corrected upon review.  03/03/2017, 4:21 PM

## 2017-03-10 ENCOUNTER — Other Ambulatory Visit: Payer: Self-pay | Admitting: "Endocrinology

## 2017-05-23 ENCOUNTER — Ambulatory Visit (INDEPENDENT_AMBULATORY_CARE_PROVIDER_SITE_OTHER): Payer: BLUE CROSS/BLUE SHIELD | Admitting: Otolaryngology

## 2017-05-23 DIAGNOSIS — J343 Hypertrophy of nasal turbinates: Secondary | ICD-10-CM | POA: Diagnosis not present

## 2017-05-23 DIAGNOSIS — J310 Chronic rhinitis: Secondary | ICD-10-CM

## 2017-05-23 DIAGNOSIS — H9209 Otalgia, unspecified ear: Secondary | ICD-10-CM | POA: Diagnosis not present

## 2017-05-23 DIAGNOSIS — R42 Dizziness and giddiness: Secondary | ICD-10-CM | POA: Diagnosis not present

## 2017-05-23 DIAGNOSIS — H6983 Other specified disorders of Eustachian tube, bilateral: Secondary | ICD-10-CM | POA: Diagnosis not present

## 2017-06-03 LAB — COMPLETE METABOLIC PANEL WITH GFR
AG Ratio: 1.4 (calc) (ref 1.0–2.5)
ALT: 32 U/L — AB (ref 6–29)
AST: 32 U/L (ref 10–35)
Albumin: 3.8 g/dL (ref 3.6–5.1)
Alkaline phosphatase (APISO): 59 U/L (ref 33–130)
BUN: 15 mg/dL (ref 7–25)
CALCIUM: 8.9 mg/dL (ref 8.6–10.4)
CHLORIDE: 105 mmol/L (ref 98–110)
CO2: 31 mmol/L (ref 20–32)
Creat: 0.79 mg/dL (ref 0.50–0.99)
GFR, EST AFRICAN AMERICAN: 91 mL/min/{1.73_m2} (ref >=60)
GFR, EST NON AFRICAN AMERICAN: 79 mL/min/{1.73_m2} (ref >=60)
GLUCOSE: 172 mg/dL — AB (ref 65–139)
Globulin: 2.8 g/dL (calc) (ref 1.9–3.7)
Potassium: 3.6 mmol/L (ref 3.5–5.3)
Sodium: 142 mmol/L (ref 135–146)
TOTAL PROTEIN: 6.6 g/dL (ref 6.1–8.1)
Total Bilirubin: 0.3 mg/dL (ref 0.2–1.2)

## 2017-06-03 LAB — HEMOGLOBIN A1C
EAG (MMOL/L): 10.4 (calc)
Hgb A1c MFr Bld: 8.2 % of total Hgb — ABNORMAL HIGH (ref <5.7)
Mean Plasma Glucose: 189 (calc)

## 2017-06-09 ENCOUNTER — Encounter: Payer: Self-pay | Admitting: "Endocrinology

## 2017-06-09 ENCOUNTER — Ambulatory Visit (INDEPENDENT_AMBULATORY_CARE_PROVIDER_SITE_OTHER): Payer: BLUE CROSS/BLUE SHIELD | Admitting: "Endocrinology

## 2017-06-09 VITALS — BP 130/79 | HR 80 | Ht 64.0 in | Wt 202.0 lb

## 2017-06-09 DIAGNOSIS — E1165 Type 2 diabetes mellitus with hyperglycemia: Secondary | ICD-10-CM | POA: Diagnosis not present

## 2017-06-09 DIAGNOSIS — IMO0001 Reserved for inherently not codable concepts without codable children: Secondary | ICD-10-CM

## 2017-06-09 DIAGNOSIS — I10 Essential (primary) hypertension: Secondary | ICD-10-CM

## 2017-06-09 DIAGNOSIS — E782 Mixed hyperlipidemia: Secondary | ICD-10-CM | POA: Diagnosis not present

## 2017-06-09 DIAGNOSIS — Z794 Long term (current) use of insulin: Secondary | ICD-10-CM

## 2017-06-09 MED ORDER — INSULIN DEGLUDEC 100 UNIT/ML ~~LOC~~ SOPN: 60.0000 [IU] | PEN_INJECTOR | Freq: Every day | SUBCUTANEOUS | 2 refills | Status: DC

## 2017-06-09 MED ORDER — SITAGLIPTIN PHOSPHATE 50 MG PO TABS: 50.0000 mg | ORAL_TABLET | Freq: Every day | ORAL | 3 refills | Status: DC

## 2017-06-09 NOTE — Patient Instructions (Signed)

## 2017-06-09 NOTE — Progress Notes (Signed)
Subjective:    Patient ID: Rebecca Roberson, female    DOB: Aug 16, 1950,    Past Medical History:  Diagnosis Date  . Arthritis   . Cervical myelopathy ([place]) 06/30/2016  . Diabetes mellitus without complication ([place])   . GERD (gastroesophageal reflux disease)   . Heart murmur   . History of kidney stones    has one now  . Hyperlipidemia   . Hypertension   . Sleep apnea    cpap have not used 3 yrs   Past Surgical History:  Procedure Laterality Date  . ANTERIOR CERVICAL DECOMP/DISCECTOMY FUSION N/A 07/30/2016   Procedure: ANTERIOR CERVICAL DECOMPRESSION/DISCECTOMY FUSION CERVICAL TWO-THREE;  Surgeon: Ashok Pall, MD;  Location: [facility];  Service: Neurosurgery;  Laterality: N/A;  ANTERIOR CERVICAL DECOMPRESSION/DISCECTOMY FUSION CERVICAL 2- CERVICAL 3  . CERVICAL CONE BIOPSY     Social History   Socioeconomic History  . Marital status: Single    Spouse name: Not on file  . Number of children: 0  . Years of education: 4  . Highest education level: Not on file  Occupational History  . Occupation: Financial controller  Social Needs  . Financial resource strain: Not on file  . Food insecurity:    Worry: Not on file    Inability: Not on file  . Transportation needs:    Medical: Not on file    Non-medical: Not on file  Tobacco Use  . Smoking status: Never Smoker  . Smokeless tobacco: Never Used  Substance and Sexual Activity  . Alcohol use: No    Alcohol/week: 0.0 oz  . Drug use: No  . Sexual activity: Not on file  Lifestyle  . Physical activity:    Days per week: Not on file    Minutes per session: Not on file  . Stress: Not on file  Relationships  . Social connections:    Talks on phone: Not on file    Gets together: Not on file    Attends religious service: Not on file    Active member of club or organization: Not on file    Attends meetings of clubs or organizations: Not on file    Relationship status: Not on file  Other Topics Concern  . Not on file   Social History Narrative   Lives alone   Caffeine use: Drinks soda (20oz per day)   Tea sometimes   Right-handed   Outpatient Encounter Medications as of 06/09/2017  Medication Sig  . aspirin EC 81 MG tablet Take 81 mg by mouth daily.  . cyclobenzaprine (FLEXERIL) 10 MG tablet Take 10 mg by mouth at bedtime as needed for muscle spasms.   . fluticasone (FLONASE) 50 MCG/ACT nasal spray Place 2 sprays into both nostrils daily.  Marland Kitchen gabapentin (NEURONTIN) 300 MG capsule Take 300 mg by mouth as needed.   Marland Kitchen glucose blood (ONETOUCH VERIO) test strip USE AS DIRECTED FOUR TIMES DAILY  . ibuprofen (ADVIL,MOTRIN) 800 MG tablet Take 800 mg by mouth 3 (three) times daily as needed for moderate pain.   Marland Kitchen insulin degludec (TRESIBA FLEXTOUCH) 100 UNIT/ML SOPN FlexTouch Pen Inject 0.6 mLs (60 Units total) into the skin daily at 10 pm.  . lisinopril-hydrochlorothiazide (PRINZIDE,ZESTORETIC) 20-25 MG per tablet Take 1 tablet by mouth daily.  Marland Kitchen omeprazole (PRILOSEC) 20 MG capsule Take 20 mg by mouth daily.  [name] VERIO test strip USE TO TEST BLOOD SUGAR FOUR TIMES DAILY  . PARoxetine (PAXIL) 20 MG tablet Take 20 mg by mouth  daily.  . potassium chloride (K-DUR,KLOR-CON) 10 MEQ tablet Take 10 mEq by mouth daily.  . rosuvastatin (CRESTOR) 10 MG tablet TAKE ONE TABLET BY MOUTH DAILY  . sitaGLIPtin (JANUVIA) 50 MG tablet Take 1 tablet (50 mg total) by mouth daily.  Marland Kitchen tolterodine (DETROL LA) 4 MG 24 hr capsule Take 4 mg by mouth daily.  . traMADol (ULTRAM) 50 MG tablet Take 2 tablets (100 mg total) by mouth every 6 (six) hours as needed for moderate pain.  Marland Kitchen ULTICARE SHORT PEN NEEDLES 31G X 8 MM MISC USE FOUR TIMES DAILY  . vitamin B-12 (CYANOCOBALAMIN) 500 MCG tablet Take 500 mcg by mouth daily.  . Vitamin D, Ergocalciferol, (DRISDOL) 50000 UNITS CAPS capsule Take 50,000 Units by mouth every Monday.   . [DISCONTINUED] insulin degludec (TRESIBA FLEXTOUCH) 100 UNIT/ML SOPN FlexTouch Pen Inject 0.5 mLs (50 Units  total) into the skin daily at 10 pm.  . [DISCONTINUED] JANUVIA 25 MG tablet TAKE ONE TABLET BY MOUTH DAILY   No facility-administered encounter medications on file as of 06/09/2017.    ALLERGIES: Allergies  Allergen Reactions  . Statins Other (See Comments)    Weak all over , muscle weakness   . Sulfa Antibiotics Rash and Other (See Comments)    Gets a fever    VACCINATION STATUS:  There is no immunization history on file for this patient.  Diabetes  She presents for her follow-up diabetic visit. She has type 2 diabetes mellitus. Onset time: Diagnosed at approximate age of 46 yrs. Her disease course has been worsening. There are no hypoglycemic associated symptoms. Pertinent negatives for hypoglycemia include no confusion, headaches, pallor or seizures. There are no diabetic associated symptoms. Pertinent negatives for diabetes include no chest pain and no polyuria. Symptoms are worsening. There are no diabetic complications. Risk factors for coronary artery disease include dyslipidemia, hypertension and sedentary lifestyle. Current diabetic treatment includes insulin injections and oral agent (monotherapy). Her weight is increasing steadily. She is following a generally unhealthy diet. Diabetic meal planning: Admits to dietary indiscretion. She drinks soda regularly. She never participates in exercise. Her home blood glucose trend is decreasing steadily. Her breakfast blood glucose range is generally 140-180 mg/dl. Her overall blood glucose range is 140-180 mg/dl.  Hypertension  This is a chronic problem. The current episode started more than 1 year ago. Pertinent negatives include no chest pain, headaches, palpitations or shortness of breath. Risk factors for coronary artery disease include family history, dyslipidemia, sedentary lifestyle, diabetes mellitus and post-menopausal state. Past treatments include ACE inhibitors.  Hyperlipidemia  This is a chronic problem. The current episode  started more than 1 year ago. Exacerbating diseases include diabetes and obesity. Pertinent negatives include no chest pain, myalgias or shortness of breath. Current antihyperlipidemic treatment includes statins. Risk factors for coronary artery disease include diabetes mellitus, dyslipidemia, hypertension, obesity, post-menopausal, a sedentary lifestyle and family history.     Review of Systems  Constitutional: Negative for unexpected weight change.  HENT: Negative for trouble swallowing and voice change.   Eyes: Negative for visual disturbance.  Respiratory: Negative for cough, shortness of breath and wheezing.   Cardiovascular: Negative for chest pain, palpitations and leg swelling.  Gastrointestinal: Negative for diarrhea, nausea and vomiting.  Endocrine: Negative for cold intolerance, heat intolerance and polyuria.  Musculoskeletal: Negative for arthralgias and myalgias.  Skin: Negative for color change, pallor, rash and wound.  Neurological: Negative for seizures and headaches.  Psychiatric/Behavioral: Negative for confusion and suicidal ideas.    Objective:  BP 130/79   Pulse 80   Ht 5\' 4"  (1.626 m)   Wt 202 lb (91.6 kg)   BMI 34.67 kg/m   Wt Readings from Last 3 Encounters:  06/09/17 202 lb (91.6 kg)  03/03/17 198 lb (89.8 kg)  01/06/17 190 lb 8 oz (86.4 kg)    Physical Exam  Constitutional: She is oriented to person, place, and time. She appears well-developed.  HENT:  Head: Normocephalic and atraumatic.  Eyes: EOM are normal.  Neck: Normal range of motion. Neck supple. No tracheal deviation present. No thyromegaly present.  Cardiovascular: Normal rate.  Pulmonary/Chest: Effort normal.  Abdominal: There is no tenderness. There is no guarding.  Musculoskeletal: Normal range of motion. She exhibits no edema.  Neurological: She is alert and oriented to person, place, and time. She has normal reflexes. No cranial nerve deficit. Coordination normal.  Skin: Skin is warm  and dry. No rash noted. No erythema. No pallor.  Psychiatric: She has a normal mood and affect. Judgment normal.    Results for orders placed or performed in visit on 03/03/17  COMPLETE METABOLIC PANEL WITH GFR  Result Value Ref Range   Glucose, Bld 172 (H) 65 - 139 mg/dL   BUN 15 7 - 25 mg/dL   Creat 0.79 0.50 - 0.99 mg/dL   GFR, Est Non African American 79 > OR = 60 mL/min/1.47m2   GFR, Est African American 91 > OR = 60 mL/min/1.32m2   BUN/Creatinine Ratio NOT APPLICABLE 6 - 22 (calc)   Sodium 142 135 - 146 mmol/L   Potassium 3.6 3.5 - 5.3 mmol/L   Chloride 105 98 - 110 mmol/L   CO2 31 20 - 32 mmol/L   Calcium 8.9 8.6 - 10.4 mg/dL   Total Protein 6.6 6.1 - 8.1 g/dL   Albumin 3.8 3.6 - 5.1 g/dL   Globulin 2.8 1.9 - 3.7 g/dL (calc)   AG Ratio 1.4 1.0 - 2.5 (calc)   Total Bilirubin 0.3 0.2 - 1.2 mg/dL   Alkaline phosphatase (APISO) 59 33 - 130 U/L   AST 32 10 - 35 U/L   ALT 32 (H) 6 - 29 U/L  Hemoglobin A1c  Result Value Ref Range   Hgb A1c MFr Bld 8.2 (H) <5.7 % of total Hgb   Mean Plasma Glucose 189 (calc)   eAG (mmol/L) 10.4 (calc)   Complete Blood Count (Most recent): Lab Results  Component Value Date   WBC 6.9 07/26/2016   HGB 12.7 07/26/2016   HCT 39.4 07/26/2016   MCV 85.5 07/26/2016   PLT 240 07/26/2016   Chemistry (most recent): Lab Results  Component Value Date   NA 142 06/02/2017   K 3.6 06/02/2017   CL 105 06/02/2017   CO2 31 06/02/2017   BUN 15 06/02/2017   CREATININE 0.79 06/02/2017   Diabetic Labs (most recent): Lab Results  Component Value Date   HGBA1C 8.2 (H) 06/02/2017   HGBA1C 7.7 (H) 02/21/2017   HGBA1C 7.1 (H) 10/26/2016   Lipid Panel     Component Value Date/Time   CHOL 266 (H) 08/04/2016 1157   TRIG 178 (H) 08/04/2016 1157   HDL 42 (L) 08/04/2016 1157   CHOLHDL 6.3 (H) 08/04/2016 1157   VLDL 36 (H) 08/04/2016 1157   LDLCALC 188 (H) 08/04/2016 1157     Assessment & Plan:   1. Uncontrolled type 2 diabetes mellitus without  complication, with long-term current use of insulin ([place])  Patient  came with above target fasting blood  glucose profile.  Her A1c has increased to 8.2% increasing from 7.7%.    Recent labs reviewed. Patient remains at a high risk for more acute and chronic complications of diabetes which include CAD, CVA, CKD, retinopathy, and neuropathy. These are all discussed in detail with the patient. I have re-counseled the patient on diet management and weight loss, by adopting a carbohydrate restricted / protein rich  Diet.  -  Suggestion is made for her to avoid simple carbohydrates  from her diet including Cakes, Sweet Desserts / Pastries, Ice Cream, Soda (diet and regular), Sweet Tea, Candies, Chips, Cookies, Store Bought Juices, Alcohol in Excess of  1-2 drinks a day, Artificial Sweeteners, and "Sugar-free" Products. This will help patient to have stable blood glucose profile and potentially avoid unintended weight gain.  I have approached patient to continue on  intensive monitoring of blood glucose and insulin therapy, and patient agrees.  I approached her to increase her Tresiba to 60 units nightly,   associated with strict monitoring of glucose  every morning before breakfast and anytime as needed. - Patient is warned not to take insulin without proper monitoring per orders. - She is urged to bring her meter and logs during each visit.  -Patient is encouraged to call clinic for blood glucose levels less than 70 or above 200 mg /dl. -She is known to have intolerance to metformin. -I advised her to increase her Januvia to 50 mg p.o. daily with breakfast.   - Side effects and precautions discussed with her.   -Target numbers for A1c, LDL, HDL, Triglycerides, Waist Circumference were discussed in detail.  2) HTN: Her blood pressure is controlled to target.  She is advised to   continue current medications  including ACEI lisinopril 20 mg.Marland Kitchen 3) HPL: LDL controlled at 188, patient reports more  consistency taking her Crestor, she is advised to stay on Crestor 10 mg p.o. nightly.  side effects and precautions discussed with her.  Will have fasting lipid panel before next visit.   4)  Weight/Diet: CDE consult, exercise, and carbs information provided.  5) Chronic Care: -Patient is on ACEI and Statin medications and encouraged to continue to follow up with Ophthalmology, Podiatrist at least yearly or according to recommendations, and advised to stay away from smoking. I have recommended yearly flu vaccine and pneumonia vaccination at least every 5 years; moderate intensity exercise for up to 150 minutes weekly; and  sleep for at least 7 hours a day.  - Time spent with the patient: 25 min, of which >50% was spent in reviewing her blood glucose logs , discussing her hypo- and hyper-glycemic episodes, reviewing her current and  previous labs and insulin doses and developing a plan to avoid hypo- and hyper-glycemia. Please refer to Patient Instructions for Blood Glucose Monitoring and Insulin/Medications Dosing Guide"  in media tab for additional information. Rebecca Roberson participated in the discussions, expressed understanding, and voiced agreement with the above plans.  All questions were answered to her satisfaction. she is encouraged to contact clinic should she have any questions or concerns prior to her return visit.  Follow up plan: Return in about 3 months (around 09/09/2017) for meter, and logs.  Glade Lloyd, MD Phone: [phone]  Fax: [phone]  -  This note was partially dictated with voice recognition software. Similar sounding words can be transcribed inadequately or may not  be corrected upon review.  06/09/2017, 5:23 PM

## 2017-06-15 ENCOUNTER — Other Ambulatory Visit: Payer: Self-pay | Admitting: "Endocrinology

## 2017-07-12 ENCOUNTER — Encounter: Payer: Self-pay | Admitting: Neurology

## 2017-07-12 ENCOUNTER — Ambulatory Visit (INDEPENDENT_AMBULATORY_CARE_PROVIDER_SITE_OTHER): Payer: BLUE CROSS/BLUE SHIELD | Admitting: Neurology

## 2017-07-12 VITALS — BP 123/65 | HR 85 | Ht 64.0 in | Wt 200.5 lb

## 2017-07-12 DIAGNOSIS — G959 Disease of spinal cord, unspecified: Secondary | ICD-10-CM

## 2017-07-12 NOTE — Progress Notes (Signed)
Reason for visit: Dizziness  Rebecca Roberson is an 67 y.o. female  History of present illness:  Rebecca Roberson is a 67 year old right-handed black female with a history of a cervical myelopathy.  The patient had been seen 6 months ago with dizziness, this has improved over the last several months.  She believes that part of the dizziness was related to using an old pair of glasses.  She is now having some issues with allergy symptoms, she has some sinus stuffiness and runny nose.  The patient will be seen an allergist in the near future.  Her dizziness overall is better.  The patient reports one fall while at work when she tripped over something, otherwise her gait stability has been good.  She has not noted any new numbness or weakness of the face, arms, or legs.  Past Medical History:  Diagnosis Date  . Arthritis   . Cervical myelopathy ([place]) 06/30/2016  . Diabetes mellitus without complication ([place])   . GERD (gastroesophageal reflux disease)   . Heart murmur   . History of kidney stones    has one now  . Hyperlipidemia   . Hypertension   . Sleep apnea    cpap have not used 3 yrs    Past Surgical History:  Procedure Laterality Date  . ANTERIOR CERVICAL DECOMP/DISCECTOMY FUSION N/A 07/30/2016   Procedure: ANTERIOR CERVICAL DECOMPRESSION/DISCECTOMY FUSION CERVICAL TWO-THREE;  Surgeon: Ashok Pall, MD;  Location: [facility];  Service: Neurosurgery;  Laterality: N/A;  ANTERIOR CERVICAL DECOMPRESSION/DISCECTOMY FUSION CERVICAL 2- CERVICAL 3  . CERVICAL CONE BIOPSY      Family History  Problem Relation Age of Onset  . Kidney disease Father   . Thyroid disease Father     Social history:  reports that she has never smoked. She has never used smokeless tobacco. She reports that she does not drink alcohol or use drugs.    Allergies  Allergen Reactions  . Statins Other (See Comments)    Weak all over , muscle weakness   . Sulfa Antibiotics Rash and Other (See Comments)    Gets a  fever     Medications:  Prior to Admission medications   Medication Sig Start Date End Date Taking? Authorizing Provider  aspirin EC 81 MG tablet Take 81 mg by mouth daily.   Yes [provider]  cyclobenzaprine (FLEXERIL) 10 MG tablet Take 10 mg by mouth at bedtime as needed for muscle spasms.    Yes [provider]  fluticasone (FLONASE) 50 MCG/ACT nasal spray Place 2 sprays into both nostrils daily. 06/23/16  Yes [provider]  gabapentin (NEURONTIN) 300 MG capsule Take 300 mg by mouth as needed.    Yes [provider]  glucose blood (ONETOUCH VERIO) test strip USE AS DIRECTED FOUR TIMES DAILY 04/17/15  Yes Nida, Marella Chimes, MD  ibuprofen (ADVIL,MOTRIN) 800 MG tablet Take 800 mg by mouth 3 (three) times daily as needed for moderate pain.    Yes [provider]  insulin degludec (TRESIBA FLEXTOUCH) 100 UNIT/ML SOPN FlexTouch Pen Inject 0.6 mLs (60 Units total) into the skin daily at 10 pm. 06/09/17  Yes Nida, Marella Chimes, MD  lisinopril-hydrochlorothiazide (PRINZIDE,ZESTORETIC) 20-25 MG per tablet Take 1 tablet by mouth daily.   Yes [provider]  omeprazole (PRILOSEC) 20 MG capsule Take 20 mg by mouth daily.   Yes [provider]  [facility] VERIO test strip USE TO TEST BLOOD SUGAR FOUR TIMES DAILY 11/25/16  Yes [name]  E, MD  PARoxetine (PAXIL) 20 MG tablet Take 20 mg by mouth daily.   Yes [provider]  potassium chloride (K-DUR,KLOR-CON) 10 MEQ tablet Take 10 mEq by mouth daily. 06/23/16  Yes [provider]  rosuvastatin (CRESTOR) 10 MG tablet TAKE ONE TABLET BY MOUTH DAILY 02/23/17  Yes Nida, Marella Chimes, MD  sitaGLIPtin (JANUVIA) 50 MG tablet Take 1 tablet (50 mg total) by mouth daily. 06/09/17  Yes Nida, Marella Chimes, MD  tolterodine (DETROL LA) 4 MG 24 hr capsule Take 4 mg by mouth daily.   Yes [provider]  traMADol (ULTRAM) 50 MG tablet Take 2 tablets (100 mg total) by  mouth every 6 (six) hours as needed for moderate pain. 08/03/16  Yes Ashok Pall, MD  ULTICARE SHORT PEN NEEDLES 31G X 8 MM MISC USE FOUR TIMES DAILY 06/16/17  Yes Nida, Marella Chimes, MD  vitamin B-12 (CYANOCOBALAMIN) 500 MCG tablet Take 500 mcg by mouth daily.   Yes [provider]  Vitamin D, Ergocalciferol, (DRISDOL) 50000 UNITS CAPS capsule Take 50,000 Units by mouth every Monday.    Yes [provider]    ROS:  Out of a complete 14 system review of symptoms, the patient complains only of the following symptoms, and all other reviewed systems are negative.  Dizziness  Blood pressure 123/65, pulse 85, height 5\' 4"  (1.626 m), weight 200 lb 8 oz (90.9 kg).  Physical Exam  General: The patient is alert and cooperative at the time of the examination.  Skin: No significant peripheral edema is noted.   Neurologic Exam  Mental status: The patient is alert and oriented x 3 at the time of the examination. The patient has apparent normal recent and remote memory, with an apparently normal attention span and concentration ability.   Cranial nerves: Facial symmetry is present. Speech is normal, no aphasia or dysarthria is noted. Extraocular movements are full. Visual fields are full.  Motor: The patient has good strength in all 4 extremities.  Sensory examination: Soft touch sensation is symmetric on the face, arms, and legs.  Coordination: The patient has good finger-nose-finger and heel-to-shin bilaterally.  Gait and station: The patient has a normal gait. Tandem gait is very minimally unsteady. Romberg is negative. No drift is seen.  Reflexes: Deep tendon reflexes are symmetric.   MRI brain 01/13/17:  IMPRESSION: No acute finding. No specific cause of the presenting symptoms is identified. Chronic small-vessel ischemic changes of the pons and the cerebral hemispheric white matter.  * MRI scan images were reviewed online. I agree with the written  report.     Assessment/Plan:  1.  Cervical myelopathy  2.  Dizziness, improved  The patient is doing better at this time.  The patient will follow-up through this office on an as-needed basis.  She will contact us if any new issues are noted.  Jill Alexanders MD 07/12/2017 2:38 PM  [facility] [address] [place], [place] [address]  Phone [phone] Fax [phone]

## 2017-07-13 ENCOUNTER — Other Ambulatory Visit: Payer: Self-pay | Admitting: "Endocrinology

## 2017-08-09 ENCOUNTER — Ambulatory Visit: Payer: BLUE CROSS/BLUE SHIELD | Admitting: Allergy & Immunology

## 2017-08-10 ENCOUNTER — Other Ambulatory Visit: Payer: Self-pay | Admitting: "Endocrinology

## 2017-09-14 ENCOUNTER — Other Ambulatory Visit: Payer: Self-pay | Admitting: "Endocrinology

## 2017-09-28 LAB — LIPID PANEL
CHOLESTEROL: 176 (ref 0–200)
HDL: 41 (ref 35–70)
LDL Cholesterol: 118
Triglycerides: 87 (ref 40–160)

## 2017-09-28 LAB — MICROALBUMIN, URINE: Microalb, Ur: 11

## 2017-09-28 LAB — HEMOGLOBIN A1C: HEMOGLOBIN A1C: 8 — AB (ref 4.0–6.0)

## 2017-10-12 ENCOUNTER — Ambulatory Visit: Payer: BLUE CROSS/BLUE SHIELD | Admitting: "Endocrinology

## 2017-11-08 ENCOUNTER — Ambulatory Visit (INDEPENDENT_AMBULATORY_CARE_PROVIDER_SITE_OTHER): Payer: BLUE CROSS/BLUE SHIELD | Admitting: "Endocrinology

## 2017-11-08 ENCOUNTER — Encounter: Payer: Self-pay | Admitting: "Endocrinology

## 2017-11-08 VITALS — BP 178/90 | HR 64 | Ht 64.0 in | Wt 200.0 lb

## 2017-11-08 DIAGNOSIS — I10 Essential (primary) hypertension: Secondary | ICD-10-CM

## 2017-11-08 DIAGNOSIS — Z794 Long term (current) use of insulin: Secondary | ICD-10-CM | POA: Diagnosis not present

## 2017-11-08 DIAGNOSIS — IMO0001 Reserved for inherently not codable concepts without codable children: Secondary | ICD-10-CM

## 2017-11-08 DIAGNOSIS — E1165 Type 2 diabetes mellitus with hyperglycemia: Secondary | ICD-10-CM

## 2017-11-08 DIAGNOSIS — E782 Mixed hyperlipidemia: Secondary | ICD-10-CM

## 2017-11-08 NOTE — Progress Notes (Signed)
Endocrinology follow-up note   Subjective:    Patient ID: Rebecca Roberson, female    DOB: 01-May-1950,    Past Medical History:  Diagnosis Date  . Arthritis   . Cervical myelopathy ([place]) 06/30/2016  . Diabetes mellitus without complication ([place])   . GERD (gastroesophageal reflux disease)   . Heart murmur   . History of kidney stones    has one now  . Hyperlipidemia   . Hypertension   . Sleep apnea    cpap have not used 3 yrs   Past Surgical History:  Procedure Laterality Date  . ANTERIOR CERVICAL DECOMP/DISCECTOMY FUSION N/A 07/30/2016   Procedure: ANTERIOR CERVICAL DECOMPRESSION/DISCECTOMY FUSION CERVICAL TWO-THREE;  Surgeon: Ashok Pall, MD;  Location: [facility];  Service: Neurosurgery;  Laterality: N/A;  ANTERIOR CERVICAL DECOMPRESSION/DISCECTOMY FUSION CERVICAL 2- CERVICAL 3  . CERVICAL CONE BIOPSY     Social History   Socioeconomic History  . Marital status: Single    Spouse name: Not on file  . Number of children: 0  . Years of education: 47  . Highest education level: Not on file  Occupational History  . Occupation: Financial controller  Social Needs  . Financial resource strain: Not on file  . Food insecurity:    Worry: Not on file    Inability: Not on file  . Transportation needs:    Medical: Not on file    Non-medical: Not on file  Tobacco Use  . Smoking status: Never Smoker  . Smokeless tobacco: Never Used  Substance and Sexual Activity  . Alcohol use: No    Alcohol/week: 0.0 standard drinks  . Drug use: No  . Sexual activity: Not on file  Lifestyle  . Physical activity:    Days per week: Not on file    Minutes per session: Not on file  . Stress: Not on file  Relationships  . Social connections:    Talks on phone: Not on file    Gets together: Not on file    Attends religious service: Not on file    Active member of club or organization: Not on file    Attends meetings of clubs or organizations: Not on file    Relationship status: Not on file   Other Topics Concern  . Not on file  Social History Narrative   Lives alone   Caffeine use: Drinks soda (20oz per day)   Tea sometimes   Right-handed   Outpatient Encounter Medications as of 11/08/2017  Medication Sig  . amLODipine (NORVASC) 5 MG tablet Take by mouth daily.  Marland Kitchen aspirin EC 81 MG tablet Take 81 mg by mouth daily.  . cyclobenzaprine (FLEXERIL) 10 MG tablet Take 10 mg by mouth at bedtime as needed for muscle spasms.   . fluticasone (FLONASE) 50 MCG/ACT nasal spray Place 2 sprays into both nostrils daily.  Marland Kitchen gabapentin (NEURONTIN) 300 MG capsule Take 300 mg by mouth as needed.   Marland Kitchen glucose blood (ONETOUCH VERIO) test strip USE AS DIRECTED FOUR TIMES DAILY  . ibuprofen (ADVIL,MOTRIN) 800 MG tablet Take 800 mg by mouth 3 (three) times daily as needed for moderate pain.   Marland Kitchen JANUVIA 50 MG tablet TAKE ONE TABLET BY MOUTH DAILY  . lisinopril-hydrochlorothiazide (PRINZIDE,ZESTORETIC) 20-25 MG per tablet Take 1 tablet by mouth daily.  Marland Kitchen omeprazole (PRILOSEC) 20 MG capsule Take 20 mg by mouth daily.  [name] VERIO test strip USE TO TEST BLOOD SUGAR FOUR TIMES DAILY  . PARoxetine (PAXIL) 20 MG tablet Take  20 mg by mouth daily.  . potassium chloride (K-DUR,KLOR-CON) 10 MEQ tablet Take 10 mEq by mouth daily.  . rosuvastatin (CRESTOR) 10 MG tablet TAKE ONE TABLET BY MOUTH DAILY  . tolterodine (DETROL LA) 4 MG 24 hr capsule Take 4 mg by mouth daily.  . traMADol (ULTRAM) 50 MG tablet Take 2 tablets (100 mg total) by mouth every 6 (six) hours as needed for moderate pain.  . TRESIBA FLEXTOUCH 100 UNIT/ML SOPN FlexTouch Pen INJECT 0.6 ML (60 UNITS TOTAL) SUBCUTANEOUSLY AT TEN PM  . ULTICARE SHORT PEN NEEDLES 31G X 8 MM MISC USE FOUR TIMES DAILY  . vitamin B-12 (CYANOCOBALAMIN) 500 MCG tablet Take 500 mcg by mouth daily.  . Vitamin D, Ergocalciferol, (DRISDOL) 50000 UNITS CAPS capsule Take 50,000 Units by mouth every Monday.    No facility-administered encounter medications on file as of  11/08/2017.    ALLERGIES: Allergies  Allergen Reactions  . Statins Other (See Comments)    Weak all over , muscle weakness   . Sulfa Antibiotics Rash and Other (See Comments)    Gets a fever    VACCINATION STATUS:  There is no immunization history on file for this patient.  Diabetes  She presents for her follow-up diabetic visit. She has type 2 diabetes mellitus. Onset time: Diagnosed at approximate age of 70 yrs. Her disease course has been stable. There are no hypoglycemic associated symptoms. Pertinent negatives for hypoglycemia include no confusion, headaches, pallor or seizures. There are no diabetic associated symptoms. Pertinent negatives for diabetes include no chest pain and no polyuria. Symptoms are stable. There are no diabetic complications. Risk factors for coronary artery disease include dyslipidemia, hypertension and sedentary lifestyle. Current diabetic treatment includes insulin injections and oral agent (monotherapy). Her weight is fluctuating minimally. She is following a generally unhealthy diet. Diabetic meal planning: Admits to dietary indiscretion. She drinks soda regularly. She never participates in exercise. Her home blood glucose trend is decreasing steadily. (She did not bring any meter nor logs to review.  Her A1c is 8% largely unchanged from last visit.)  Hypertension  This is a chronic problem. The current episode started more than 1 year ago. Pertinent negatives include no chest pain, headaches, palpitations or shortness of breath. Risk factors for coronary artery disease include family history, dyslipidemia, sedentary lifestyle, diabetes mellitus and post-menopausal state. Past treatments include ACE inhibitors.  Hyperlipidemia  This is a chronic problem. The current episode started more than 1 year ago. Exacerbating diseases include diabetes and obesity. Pertinent negatives include no chest pain, myalgias or shortness of breath. Current antihyperlipidemic treatment  includes statins. Risk factors for coronary artery disease include diabetes mellitus, dyslipidemia, hypertension, obesity, post-menopausal, a sedentary lifestyle and family history.    Review of Systems  Constitutional: Negative for unexpected weight change.  HENT: Negative for trouble swallowing and voice change.   Eyes: Negative for visual disturbance.  Respiratory: Negative for cough, shortness of breath and wheezing.   Cardiovascular: Negative for chest pain, palpitations and leg swelling.  Gastrointestinal: Negative for diarrhea, nausea and vomiting.  Endocrine: Negative for cold intolerance, heat intolerance and polyuria.  Musculoskeletal: Negative for arthralgias and myalgias.  Skin: Negative for color change, pallor, rash and wound.  Neurological: Negative for seizures and headaches.  Psychiatric/Behavioral: Negative for confusion and suicidal ideas.    Objective:    BP (!) 178/90   Pulse 64   Ht 5\' 4"  (1.626 m)   Wt 200 lb (90.7 kg)   BMI 34.33 kg/m  Wt Readings from Last 3 Encounters:  11/08/17 200 lb (90.7 kg)  07/12/17 200 lb 8 oz (90.9 kg)  06/09/17 202 lb (91.6 kg)    Physical Exam  Constitutional: She is oriented to person, place, and time. She appears well-developed.  HENT:  Head: Normocephalic and atraumatic.  Eyes: EOM are normal.  Neck: Normal range of motion. Neck supple. No tracheal deviation present. No thyromegaly present.  Cardiovascular: Normal rate.  Pulmonary/Chest: Effort normal.  Abdominal: There is no tenderness. There is no guarding.  Musculoskeletal: Normal range of motion. She exhibits no edema.  Neurological: She is alert and oriented to person, place, and time. She has normal reflexes. No cranial nerve deficit. Coordination normal.  Skin: Skin is warm and dry. No rash noted. No erythema. No pallor.  Psychiatric: She has a normal mood and affect. Judgment normal.    Results for orders placed or performed in visit on 11/08/17   Microalbumin, urine  Result Value Ref Range   Microalb, Ur 11   Lipid panel  Result Value Ref Range   Triglycerides 87 40 - 160   Cholesterol 176 0 - 200   HDL 41 35 - 70   LDL Cholesterol 118   Hemoglobin A1c  Result Value Ref Range   Hgb A1c MFr Bld 8.0 (A) 4.0 - 6.0   Complete Blood Count (Most recent): Lab Results  Component Value Date   WBC 6.9 07/26/2016   HGB 12.7 07/26/2016   HCT 39.4 07/26/2016   MCV 85.5 07/26/2016   PLT 240 07/26/2016   Chemistry (most recent): Lab Results  Component Value Date   NA 142 06/02/2017   K 3.6 06/02/2017   CL 105 06/02/2017   CO2 31 06/02/2017   BUN 15 06/02/2017   CREATININE 0.79 06/02/2017   Diabetic Labs (most recent): Lab Results  Component Value Date   HGBA1C 8.0 (A) 09/28/2017   HGBA1C 8.2 (H) 06/02/2017   HGBA1C 7.7 (H) 02/21/2017   Lipid Panel     Component Value Date/Time   CHOL 176 09/28/2017   TRIG 87 09/28/2017   HDL 41 09/28/2017   CHOLHDL 6.3 (H) 08/04/2016 1157   VLDL 36 (H) 08/04/2016 1157   LDLCALC 118 09/28/2017     Assessment & Plan:   1. Uncontrolled type 2 diabetes mellitus without complication, with long-term current use of insulin ([place])  She came with no meter nor logs to review.  Her A1c is 8%, unchanged from her last visit A1c of 8.2%.     Recent labs reviewed. Patient remains at a high risk for more acute and chronic complications of diabetes which include CAD, CVA, CKD, retinopathy, and neuropathy. These are all discussed in detail with the patient. I have re-counseled the patient on diet management and weight loss, by adopting a carbohydrate restricted / protein rich  Diet.  -  Suggestion is made for her to avoid simple carbohydrates  from her diet including Cakes, Sweet Desserts / Pastries, Ice Cream, Soda (diet and regular), Sweet Tea, Candies, Chips, Cookies, Store Bought Juices, Alcohol in Excess of  1-2 drinks a day, Artificial Sweeteners, and "Sugar-free" Products. This will help  patient to have stable blood glucose profile and potentially avoid unintended weight gain.  I have approached patient to continue on  intensive monitoring of blood glucose and insulin therapy, and patient agrees.  -Patient had questionable compliance for monitoring blood glucose.  This makes it difficult to optimize her insulin doses. -She promises to do better and  will drop of her logs sometime this week. -She is advised to continue Tresiba 60 units nightly,   associated with strict monitoring of glucose  every morning before breakfast and daily before she goes to bed.   - Patient is warned not to take insulin without proper monitoring per orders. - She is urged to bring her meter and logs during each visit.  -Patient is encouraged to call clinic for blood glucose levels less than 70 or above 200 mg /dl. -She is known to have intolerance to metformin. -She is advised to continue Januvia  50 mg p.o. daily with breakfast.   - Side effects and precautions discussed with her.   -Target numbers for A1c, LDL, HDL, Triglycerides, Waist Circumference were discussed in detail.  2) HTN: Her blood pressure is not controlled to target.  Klonopin 5 mg was added to her lisinopril/HCTZ this morning.  She is advised to measure blood pressure at home and report if readings are still above 160/90.    3) HPL: Her recent lipid panel showed improved LDL of 118 from 188.  -She is advised to continue Crestor 10 mg p.o. nightly.     4)  Weight/Diet: CDE consult, exercise, and carbs information provided.   5) Chronic Care: -Patient is on ACEI and Statin medications and encouraged to continue to follow up with Ophthalmology, Podiatrist at least yearly or according to recommendations, and advised to stay away from smoking. I have recommended yearly flu vaccine and pneumonia vaccination at least every 5 years; moderate intensity exercise for up to 150 minutes weekly; and  sleep for at least 7 hours a day. - Time  spent with the patient: 25 min, of which >50% was spent in reviewing her  current and  previous labs, previous treatments, and medications doses and developing a plan for long-term care.  Rebecca Roberson participated in the discussions, expressed understanding, and voiced agreement with the above plans.  All questions were answered to her satisfaction. she is encouraged to contact clinic should she have any questions or concerns prior to her return visit.   Follow up plan: Return in about 3 months (around 02/08/2018) for Follow up with Pre-visit Labs, Meter, and Logs.  Glade Lloyd, MD Phone: [phone]  Fax: [phone]  -  This note was partially dictated with voice recognition software. Similar sounding words can be transcribed inadequately or may not  be corrected upon review.  11/08/2017, 4:21 PM

## 2017-11-08 NOTE — Patient Instructions (Signed)

## 2017-11-11 ENCOUNTER — Other Ambulatory Visit: Payer: Self-pay | Admitting: "Endocrinology

## 2017-12-08 ENCOUNTER — Other Ambulatory Visit: Payer: Self-pay | Admitting: "Endocrinology

## 2018-01-06 ENCOUNTER — Other Ambulatory Visit: Payer: Self-pay | Admitting: "Endocrinology

## 2018-02-06 ENCOUNTER — Other Ambulatory Visit: Payer: Self-pay | Admitting: "Endocrinology

## 2018-02-07 LAB — COMPLETE METABOLIC PANEL WITH GFR
AG RATIO: 1.3 (calc) (ref 1.0–2.5)
ALKALINE PHOSPHATASE (APISO): 62 U/L (ref 33–130)
ALT: 29 U/L (ref 6–29)
AST: 37 U/L — ABNORMAL HIGH (ref 10–35)
Albumin: 3.9 g/dL (ref 3.6–5.1)
BILIRUBIN TOTAL: 0.4 mg/dL (ref 0.2–1.2)
BUN: 16 mg/dL (ref 7–25)
CO2: 28 mmol/L (ref 20–32)
Calcium: 9.1 mg/dL (ref 8.6–10.4)
Chloride: 103 mmol/L (ref 98–110)
Creat: 0.63 mg/dL (ref 0.50–0.99)
GFR, Est African American: 109 mL/min/{1.73_m2} (ref >=60)
GFR, Est Non African American: 94 mL/min/{1.73_m2} (ref >=60)
GLUCOSE: 154 mg/dL — AB (ref 65–139)
Globulin: 3 g/dL (calc) (ref 1.9–3.7)
POTASSIUM: 3.5 mmol/L (ref 3.5–5.3)
Sodium: 140 mmol/L (ref 135–146)
Total Protein: 6.9 g/dL (ref 6.1–8.1)

## 2018-02-07 LAB — HEMOGLOBIN A1C
EAG (MMOL/L): 10.3 (calc)
HEMOGLOBIN A1C: 8.1 %{Hb} — AB (ref <5.7)
MEAN PLASMA GLUCOSE: 186 (calc)

## 2018-02-08 ENCOUNTER — Ambulatory Visit: Payer: BLUE CROSS/BLUE SHIELD | Admitting: "Endocrinology

## 2018-02-15 ENCOUNTER — Encounter: Payer: Self-pay | Admitting: "Endocrinology

## 2018-02-15 ENCOUNTER — Ambulatory Visit: Payer: BLUE CROSS/BLUE SHIELD | Admitting: "Endocrinology

## 2018-02-15 VITALS — BP 138/81 | HR 89 | Resp 12 | Ht 64.0 in | Wt 200.6 lb

## 2018-02-15 DIAGNOSIS — Z794 Long term (current) use of insulin: Secondary | ICD-10-CM | POA: Diagnosis not present

## 2018-02-15 DIAGNOSIS — I10 Essential (primary) hypertension: Secondary | ICD-10-CM | POA: Diagnosis not present

## 2018-02-15 DIAGNOSIS — E782 Mixed hyperlipidemia: Secondary | ICD-10-CM

## 2018-02-15 DIAGNOSIS — E1165 Type 2 diabetes mellitus with hyperglycemia: Secondary | ICD-10-CM | POA: Diagnosis not present

## 2018-02-15 DIAGNOSIS — IMO0001 Reserved for inherently not codable concepts without codable children: Secondary | ICD-10-CM

## 2018-02-15 MED ORDER — INSULIN DEGLUDEC 100 UNIT/ML ~~LOC~~ SOPN: 70.0000 [IU] | PEN_INJECTOR | Freq: Every day | SUBCUTANEOUS | 2 refills | Status: DC

## 2018-02-15 NOTE — Progress Notes (Signed)
Endocrinology follow-up note   Subjective:    Patient ID: Rebecca Roberson, female    DOB: 10/17/1950,    Past Medical History:  Diagnosis Date  . Arthritis   . Cervical myelopathy ([place]) 06/30/2016  . Diabetes mellitus without complication ([place])   . GERD (gastroesophageal reflux disease)   . Heart murmur   . History of kidney stones    has one now  . Hyperlipidemia   . Hypertension   . Sleep apnea    cpap have not used 3 yrs   Past Surgical History:  Procedure Laterality Date  . ANTERIOR CERVICAL DECOMP/DISCECTOMY FUSION N/A 07/30/2016   Procedure: ANTERIOR CERVICAL DECOMPRESSION/DISCECTOMY FUSION CERVICAL TWO-THREE;  Surgeon: Ashok Pall, MD;  Location: [facility];  Service: Neurosurgery;  Laterality: N/A;  ANTERIOR CERVICAL DECOMPRESSION/DISCECTOMY FUSION CERVICAL 2- CERVICAL 3  . CERVICAL CONE BIOPSY     Social History   Socioeconomic History  . Marital status: Single    Spouse name: Not on file  . Number of children: 0  . Years of education: 44  . Highest education level: Not on file  Occupational History  . Occupation: Financial controller  Social Needs  . Financial resource strain: Not on file  . Food insecurity:    Worry: Not on file    Inability: Not on file  . Transportation needs:    Medical: Not on file    Non-medical: Not on file  Tobacco Use  . Smoking status: Never Smoker  . Smokeless tobacco: Never Used  Substance and Sexual Activity  . Alcohol use: No    Alcohol/week: 0.0 standard drinks  . Drug use: No  . Sexual activity: Not on file  Lifestyle  . Physical activity:    Days per week: Not on file    Minutes per session: Not on file  . Stress: Not on file  Relationships  . Social connections:    Talks on phone: Not on file    Gets together: Not on file    Attends religious service: Not on file    Active member of club or organization: Not on file    Attends meetings of clubs or organizations: Not on file    Relationship status: Not on file   Other Topics Concern  . Not on file  Social History Narrative   Lives alone   Caffeine use: Drinks soda (20oz per day)   Tea sometimes   Right-handed   Outpatient Encounter Medications as of 02/15/2018  Medication Sig  . amLODipine (NORVASC) 5 MG tablet Take by mouth daily.  Marland Kitchen aspirin EC 81 MG tablet Take 81 mg by mouth daily.  . cyclobenzaprine (FLEXERIL) 10 MG tablet Take 10 mg by mouth at bedtime as needed for muscle spasms.   . fluticasone (FLONASE) 50 MCG/ACT nasal spray Place 2 sprays into both nostrils daily.  Marland Kitchen gabapentin (NEURONTIN) 300 MG capsule Take 300 mg by mouth as needed.   Marland Kitchen glucose blood (ONETOUCH VERIO) test strip USE AS DIRECTED FOUR TIMES DAILY  . ibuprofen (ADVIL,MOTRIN) 800 MG tablet Take 800 mg by mouth 3 (three) times daily as needed for moderate pain.   Marland Kitchen insulin degludec (TRESIBA FLEXTOUCH) 100 UNIT/ML SOPN FlexTouch Pen Inject 0.7 mLs (70 Units total) into the skin at bedtime.  Marland Kitchen JANUVIA 50 MG tablet TAKE ONE TABLET BY MOUTH DAILY  . lisinopril-hydrochlorothiazide (PRINZIDE,ZESTORETIC) 20-25 MG per tablet Take 1 tablet by mouth daily.  Marland Kitchen omeprazole (PRILOSEC) 20 MG capsule Take 20 mg by mouth daily.  Marland Kitchen  ONETOUCH VERIO test strip USE TO TEST BLOOD SUGAR FOUR TIMES DAILY  . PARoxetine (PAXIL) 20 MG tablet Take 20 mg by mouth daily.  . potassium chloride (K-DUR,KLOR-CON) 10 MEQ tablet Take 10 mEq by mouth daily.  . rosuvastatin (CRESTOR) 10 MG tablet TAKE ONE TABLET BY MOUTH DAILY  . tolterodine (DETROL LA) 4 MG 24 hr capsule Take 4 mg by mouth daily.  . traMADol (ULTRAM) 50 MG tablet Take 2 tablets (100 mg total) by mouth every 6 (six) hours as needed for moderate pain.  Marland Kitchen ULTICARE SHORT PEN NEEDLES 31G X 8 MM MISC USE FOUR TIMES DAILY  . vitamin B-12 (CYANOCOBALAMIN) 500 MCG tablet Take 500 mcg by mouth daily.  . Vitamin D, Ergocalciferol, (DRISDOL) 50000 UNITS CAPS capsule Take 50,000 Units by mouth every Monday.   . [DISCONTINUED] TRESIBA FLEXTOUCH 100  UNIT/ML SOPN FlexTouch Pen INJECT 0.6 ML (60 UNITS TOTAL) DAILY AT 10PM.   No facility-administered encounter medications on file as of 02/15/2018.    ALLERGIES: Allergies  Allergen Reactions  . Metformin Diarrhea  . Statins Other (See Comments)    Weak all over , muscle weakness   . Sulfa Antibiotics Rash and Other (See Comments)    Gets a fever   . Sulfasalazine Other (See Comments) and Rash    Gets a fever    VACCINATION STATUS:  There is no immunization history on file for this patient.  Diabetes  She presents for her follow-up diabetic visit. She has type 2 diabetes mellitus. Onset time: Diagnosed at approximate age of 7 yrs. Her disease course has been stable. There are no hypoglycemic associated symptoms. Pertinent negatives for hypoglycemia include no confusion, headaches, pallor or seizures. There are no diabetic associated symptoms. Pertinent negatives for diabetes include no chest pain and no polyuria. Symptoms are stable. There are no diabetic complications. Risk factors for coronary artery disease include dyslipidemia, hypertension and sedentary lifestyle. Current diabetic treatment includes insulin injections and oral agent (monotherapy). Her weight is fluctuating minimally. She is following a generally unhealthy diet. Diabetic meal planning: Admits to dietary indiscretion. She drinks soda regularly. She never participates in exercise. Her home blood glucose trend is decreasing steadily. Her breakfast blood glucose range is generally 180-200 mg/dl. Her overall blood glucose range is 180-200 mg/dl.  Hypertension  This is a chronic problem. The current episode started more than 1 year ago. Pertinent negatives include no chest pain, headaches, palpitations or shortness of breath. Risk factors for coronary artery disease include family history, dyslipidemia, sedentary lifestyle, diabetes mellitus and post-menopausal state. Past treatments include ACE inhibitors.  Hyperlipidemia   This is a chronic problem. The current episode started more than 1 year ago. Exacerbating diseases include diabetes and obesity. Pertinent negatives include no chest pain, myalgias or shortness of breath. Current antihyperlipidemic treatment includes statins. Risk factors for coronary artery disease include diabetes mellitus, dyslipidemia, hypertension, obesity, post-menopausal, a sedentary lifestyle and family history.    Review of Systems  Constitutional: Negative for unexpected weight change.  HENT: Negative for trouble swallowing and voice change.   Eyes: Negative for visual disturbance.  Respiratory: Negative for cough, shortness of breath and wheezing.   Cardiovascular: Negative for chest pain, palpitations and leg swelling.  Gastrointestinal: Negative for diarrhea, nausea and vomiting.  Endocrine: Negative for cold intolerance, heat intolerance and polyuria.  Musculoskeletal: Negative for arthralgias and myalgias.  Skin: Negative for color change, pallor, rash and wound.  Neurological: Negative for seizures and headaches.  Psychiatric/Behavioral: Negative for confusion and  suicidal ideas.    Objective:    BP 138/81   Pulse 89   Resp 12   Ht 5\' 4"  (1.626 m)   Wt 200 lb 9.6 oz (91 kg)   SpO2 97%   BMI 34.43 kg/m   Wt Readings from Last 3 Encounters:  02/15/18 200 lb 9.6 oz (91 kg)  11/08/17 200 lb (90.7 kg)  07/12/17 200 lb 8 oz (90.9 kg)    Physical Exam  Constitutional: She is oriented to person, place, and time. She appears well-developed.  HENT:  Head: Normocephalic and atraumatic.  Eyes: EOM are normal.  Neck: Normal range of motion. Neck supple. No tracheal deviation present. No thyromegaly present.  Cardiovascular: Normal rate.  Pulmonary/Chest: Effort normal.  Abdominal: There is no abdominal tenderness. There is no guarding.  Musculoskeletal: Normal range of motion.        General: No edema.  Neurological: She is alert and oriented to person, place, and  time. She has normal reflexes. No cranial nerve deficit. Coordination normal.  Skin: Skin is warm and dry. No rash noted. No erythema. No pallor.  Psychiatric: She has a normal mood and affect. Judgment normal.    Results for orders placed or performed in visit on 11/08/17  Hemoglobin A1c  Result Value Ref Range   Hgb A1c MFr Bld 8.1 (H) <5.7 % of total Hgb   Mean Plasma Glucose 186 (calc)   eAG (mmol/L) 10.3 (calc)  COMPLETE METABOLIC PANEL WITH GFR  Result Value Ref Range   Glucose, Bld 154 (H) 65 - 139 mg/dL   BUN 16 7 - 25 mg/dL   Creat 0.63 0.50 - 0.99 mg/dL   GFR, Est Non African American 94 > OR = 60 mL/min/1.53m2   GFR, Est African American 109 > OR = 60 mL/min/1.28m2   BUN/Creatinine Ratio NOT APPLICABLE 6 - 22 (calc)   Sodium 140 135 - 146 mmol/L   Potassium 3.5 3.5 - 5.3 mmol/L   Chloride 103 98 - 110 mmol/L   CO2 28 20 - 32 mmol/L   Calcium 9.1 8.6 - 10.4 mg/dL   Total Protein 6.9 6.1 - 8.1 g/dL   Albumin 3.9 3.6 - 5.1 g/dL   Globulin 3.0 1.9 - 3.7 g/dL (calc)   AG Ratio 1.3 1.0 - 2.5 (calc)   Total Bilirubin 0.4 0.2 - 1.2 mg/dL   Alkaline phosphatase (APISO) 62 33 - 130 U/L   AST 37 (H) 10 - 35 U/L   ALT 29 6 - 29 U/L  Microalbumin, urine  Result Value Ref Range   Microalb, Ur 11   Lipid panel  Result Value Ref Range   Triglycerides 87 40 - 160   Cholesterol 176 0 - 200   HDL 41 35 - 70   LDL Cholesterol 118   Hemoglobin A1c  Result Value Ref Range   Hgb A1c MFr Bld 8.0 (A) 4.0 - 6.0   Complete Blood Count (Most recent): Lab Results  Component Value Date   WBC 6.9 07/26/2016   HGB 12.7 07/26/2016   HCT 39.4 07/26/2016   MCV 85.5 07/26/2016   PLT 240 07/26/2016   Chemistry (most recent): Lab Results  Component Value Date   NA 140 02/06/2018   K 3.5 02/06/2018   CL 103 02/06/2018   CO2 28 02/06/2018   BUN 16 02/06/2018   CREATININE 0.63 02/06/2018   Diabetic Labs (most recent): Lab Results  Component Value Date   HGBA1C 8.1 (H) 02/06/2018  HGBA1C 8.0 (A) 09/28/2017   HGBA1C 8.2 (H) 06/02/2017   Lipid Panel     Component Value Date/Time   CHOL 176 09/28/2017   TRIG 87 09/28/2017   HDL 41 09/28/2017   CHOLHDL 6.3 (H) 08/04/2016 1157   VLDL 36 (H) 08/04/2016 1157   LDLCALC 118 09/28/2017     Assessment & Plan:   1. Uncontrolled type 2 diabetes mellitus without complication, with long-term current use of insulin ([place])  She came with A1c of 8.1% largely unchanged from her last visit A1c of 8%.     Recent labs reviewed. Patient remains at a high risk for more acute and chronic complications of diabetes which include CAD, CVA, CKD, retinopathy, and neuropathy. These are all discussed in detail with the patient. I have re-counseled the patient on diet management and weight loss, by adopting a carbohydrate restricted / protein rich  Diet.  -  Suggestion is made for her to avoid simple carbohydrates  from her diet including Cakes, Sweet Desserts / Pastries, Ice Cream, Soda (diet and regular), Sweet Tea, Candies, Chips, Cookies, Store Bought Juices, Alcohol in Excess of  1-2 drinks a day, Artificial Sweeteners, and "Sugar-free" Products. This will help patient to have stable blood glucose profile and potentially avoid unintended weight gain.   I have approached patient to continue on  intensive monitoring of blood glucose and insulin therapy, and patient agrees.  -Patient had questionable compliance for monitoring blood glucose.  This makes it difficult to optimize her insulin doses. -She promises to do better and will drop of her logs sometime this week. -She is advised to increase Tresiba to 70 units  nightly,   associated with strict monitoring of glucose 2 times a day-daily before breakfast and at bedtime.     - Patient is warned not to take insulin without proper monitoring per orders. - She is urged to bring her meter and logs during each visit.  -Patient is encouraged to call clinic for blood glucose levels less  than 70 or above 180 mg /dl. -She is known to have intolerance to metformin.  She will be considered for low-dose glipizide on next visit if A1c remains above 8%. -She is advised to continue Januvia  50 mg p.o. daily with breakfast.   - Side effects and precautions discussed with her.   -Target numbers for A1c, LDL, HDL, Triglycerides, Waist Circumference were discussed in detail.  2) HTN: Her blood pressure is not controlled to target.  Klonopin 5 mg was added to her lisinopril/HCTZ this morning.  She is advised to measure blood pressure at home and report if readings are still above 160/90.    3) HPL: Her recent lipid panel showed improved LDL of 118 from 188.  -She is advised to continue Crestor  10 mg p.o. nightly.     4)  Weight/Diet: CDE consult, exercise, and carbs information provided.   5) Chronic Care: -Patient is on ACEI and Statin medications and encouraged to continue to follow up with Ophthalmology, Podiatrist at least yearly or according to recommendations, and advised to stay away from smoking. I have recommended yearly flu vaccine and pneumonia vaccination at least every 5 years; moderate intensity exercise for up to 150 minutes weekly; and  sleep for at least 7 hours a day.  - Time spent with the patient: 25 min, of which >50% was spent in reviewing her blood glucose logs , discussing her hypo- and hyper-glycemic episodes, reviewing her current and  previous labs and  insulin doses and developing a plan to avoid hypo- and hyper-glycemia. Please refer to Patient Instructions for Blood Glucose Monitoring and Insulin/Medications Dosing Guide"  in media tab for additional information. Rebecca Roberson participated in the discussions, expressed understanding, and voiced agreement with the above plans.  All questions were answered to her satisfaction. she is encouraged to contact clinic should she have any questions or concerns prior to her return visit.   Follow up plan: Return in  about 3 months (around 05/17/2018) for Follow up with Pre-visit Labs, Meter, and Logs.  Glade Lloyd, MD Phone: [phone]  Fax: [phone]  -  This note was partially dictated with voice recognition software. Similar sounding words can be transcribed inadequately or may not  be corrected upon review.  02/15/2018, 3:53 PM

## 2018-02-15 NOTE — Patient Instructions (Signed)

## 2018-03-08 ENCOUNTER — Other Ambulatory Visit: Payer: Self-pay | Admitting: "Endocrinology

## 2018-05-12 ENCOUNTER — Other Ambulatory Visit: Payer: Self-pay | Admitting: "Endocrinology

## 2018-05-13 LAB — COMPLETE METABOLIC PANEL WITH GFR
AG Ratio: 1.3 (calc) (ref 1.0–2.5)
ALKALINE PHOSPHATASE (APISO): 65 U/L (ref 37–153)
ALT: 39 U/L — ABNORMAL HIGH (ref 6–29)
AST: 49 U/L — AB (ref 10–35)
Albumin: 4.1 g/dL (ref 3.6–5.1)
BUN: 12 mg/dL (ref 7–25)
CALCIUM: 9.3 mg/dL (ref 8.6–10.4)
CHLORIDE: 102 mmol/L (ref 98–110)
CO2: 24 mmol/L (ref 20–32)
CREATININE: 0.73 mg/dL (ref 0.50–0.99)
GFR, Est African American: 100 mL/min/{1.73_m2} (ref >=60)
GFR, Est Non African American: 86 mL/min/{1.73_m2} (ref >=60)
GLOBULIN: 3.1 g/dL (ref 1.9–3.7)
Glucose, Bld: 198 mg/dL — ABNORMAL HIGH (ref 65–139)
Potassium: 3.8 mmol/L (ref 3.5–5.3)
SODIUM: 138 mmol/L (ref 135–146)
TOTAL PROTEIN: 7.2 g/dL (ref 6.1–8.1)
Total Bilirubin: 0.4 mg/dL (ref 0.2–1.2)

## 2018-05-13 LAB — HEMOGLOBIN A1C
Hgb A1c MFr Bld: 8.5 % of total Hgb — ABNORMAL HIGH (ref <5.7)
Mean Plasma Glucose: 197 (calc)
eAG (mmol/L): 10.9 (calc)

## 2018-05-18 ENCOUNTER — Ambulatory Visit (INDEPENDENT_AMBULATORY_CARE_PROVIDER_SITE_OTHER): Payer: BLUE CROSS/BLUE SHIELD | Admitting: "Endocrinology

## 2018-05-18 ENCOUNTER — Other Ambulatory Visit: Payer: Self-pay

## 2018-05-18 ENCOUNTER — Encounter: Payer: Self-pay | Admitting: "Endocrinology

## 2018-05-18 DIAGNOSIS — E1165 Type 2 diabetes mellitus with hyperglycemia: Secondary | ICD-10-CM

## 2018-05-18 DIAGNOSIS — Z794 Long term (current) use of insulin: Secondary | ICD-10-CM

## 2018-05-18 DIAGNOSIS — E782 Mixed hyperlipidemia: Secondary | ICD-10-CM

## 2018-05-18 DIAGNOSIS — IMO0001 Reserved for inherently not codable concepts without codable children: Secondary | ICD-10-CM

## 2018-05-18 DIAGNOSIS — I10 Essential (primary) hypertension: Secondary | ICD-10-CM | POA: Diagnosis not present

## 2018-05-18 MED ORDER — SITAGLIPTIN PHOSPHATE 100 MG PO TABS: 100.0000 mg | ORAL_TABLET | Freq: Every day | ORAL | 3 refills | Status: DC

## 2018-05-18 MED ORDER — INSULIN DEGLUDEC 100 UNIT/ML ~~LOC~~ SOPN: 80.0000 [IU] | PEN_INJECTOR | Freq: Every day | SUBCUTANEOUS | 2 refills | Status: DC

## 2018-05-18 NOTE — Progress Notes (Signed)
Endocrinology Telehealth Visit Follow up Note -During COVID -19 Pandemic  This visit type was conducted due to national recommendations for restrictions regarding the COVID-19 Pandemic  in an effort to limit this patient's exposure and mitigate transmission of the corona virus.  Due to his co-morbid illnesses, this patient is at  moderate to high risk for complications without adequate follow up.  This format is felt to be most appropriate for this patient at this time.  All issues noted in this document were discussed and addressed. The format was not optimal for physical exam.  Patient has verbally consented to this visit, and please refer to the patient's chart for details of arrangement for this telehealth for [facility] .       Subjective:    Patient ID: Rebecca Roberson, female    DOB: 06-02-1950,    Past Medical History:  Diagnosis Date  . Arthritis   . Cervical myelopathy ([place]) 06/30/2016  . Diabetes mellitus without complication ([place])   . GERD (gastroesophageal reflux disease)   . Heart murmur   . History of kidney stones    has one now  . Hyperlipidemia   . Hypertension   . Sleep apnea    cpap have not used 3 yrs   Past Surgical History:  Procedure Laterality Date  . ANTERIOR CERVICAL DECOMP/DISCECTOMY FUSION N/A 07/30/2016   Procedure: ANTERIOR CERVICAL DECOMPRESSION/DISCECTOMY FUSION CERVICAL TWO-THREE;  Surgeon: Ashok Pall, MD;  Location: [place];  Service: Neurosurgery;  Laterality: N/A;  ANTERIOR CERVICAL DECOMPRESSION/DISCECTOMY FUSION CERVICAL 2- CERVICAL 3  . CERVICAL CONE BIOPSY     Social History   Socioeconomic History  . Marital status: Single    Spouse name: Not on file  . Number of children: 0  . Years of education: 46  . Highest education level: Not on file  Occupational History  . Occupation: Financial controller  Social Needs  . Financial resource strain: Not on file  . Food insecurity:    Worry: Not  on file    Inability: Not on file  . Transportation needs:    Medical: Not on file    Non-medical: Not on file  Tobacco Use  . Smoking status: Never Smoker  . Smokeless tobacco: Never Used  Substance and Sexual Activity  . Alcohol use: No    Alcohol/week: 0.0 standard drinks  . Drug use: No  . Sexual activity: Not on file  Lifestyle  . Physical activity:    Days per week: Not on file    Minutes per session: Not on file  . Stress: Not on file  Relationships  . Social connections:    Talks on phone: Not on file    Gets together: Not on file    Attends religious service: Not on file    Active member of club or organization: Not on file    Attends meetings of clubs or organizations: Not on file    Relationship status: Not on file  Other Topics Concern  . Not on file  Social History Narrative   Lives alone   Caffeine use: Drinks soda (20oz per day)   Tea sometimes   Right-handed   Outpatient Encounter Medications as of 05/18/2018  Medication Sig  . amLODipine (NORVASC) 5 MG tablet Take by mouth daily.  Marland Kitchen aspirin EC 81 MG tablet Take 81 mg by mouth daily.  . cyclobenzaprine (FLEXERIL) 10 MG tablet Take 10 mg by mouth at bedtime as needed for muscle spasms.   . fluticasone (FLONASE) 50 MCG/ACT nasal spray Place 2 sprays into both nostrils daily.  Marland Kitchen gabapentin (NEURONTIN) 300 MG capsule Take 300 mg by mouth as needed.   Marland Kitchen glucose blood (ONETOUCH VERIO) test strip USE AS DIRECTED FOUR TIMES DAILY  . ibuprofen (ADVIL,MOTRIN) 800 MG tablet Take 800 mg by mouth 3 (three) times daily as needed for moderate pain.   Marland Kitchen insulin degludec (TRESIBA FLEXTOUCH) 100 UNIT/ML SOPN FlexTouch Pen Inject 0.8 mLs (80 Units total) into the skin at bedtime.  Marland Kitchen lisinopril-hydrochlorothiazide (PRINZIDE,ZESTORETIC) 20-25 MG per tablet Take 1 tablet by mouth daily.  Marland Kitchen omeprazole (PRILOSEC) 20 MG capsule Take 20 mg by mouth daily.  [name] VERIO test strip USE TO TEST BLOOD SUGAR FOUR TIMES DAILY  .  PARoxetine (PAXIL) 20 MG tablet Take 20 mg by mouth daily.  . potassium chloride (K-DUR,KLOR-CON) 10 MEQ tablet Take 10 mEq by mouth daily.  . rosuvastatin (CRESTOR) 10 MG tablet TAKE ONE TABLET BY MOUTH EVERY DAY  . sitaGLIPtin (JANUVIA) 100 MG tablet Take 1 tablet (100 mg total) by mouth daily.  Marland Kitchen tolterodine (DETROL LA) 4 MG 24 hr capsule Take 4 mg by mouth daily.  . traMADol (ULTRAM) 50 MG tablet Take 2 tablets (100 mg total) by mouth every 6 (six) hours as needed for moderate pain.  Marland Kitchen ULTICARE SHORT PEN NEEDLES 31G X 8 MM MISC USE FOUR TIMES DAILY  . vitamin B-12 (CYANOCOBALAMIN) 500 MCG tablet Take 500 mcg by mouth daily.  . Vitamin D, Ergocalciferol, (DRISDOL) 50000 UNITS CAPS capsule Take 50,000 Units by mouth every Monday.   . [DISCONTINUED] insulin degludec (TRESIBA FLEXTOUCH) 100 UNIT/ML SOPN FlexTouch Pen Inject 0.7 mLs (70 Units total) into the skin at bedtime.  . [DISCONTINUED] JANUVIA 50 MG tablet TAKE ONE TABLET BY MOUTH DAILY.   No facility-administered encounter medications on file as of 05/18/2018.    ALLERGIES: Allergies  Allergen Reactions  . Metformin Diarrhea  . Statins Other (See Comments)    Weak all over , muscle weakness   . Sulfa Antibiotics Rash and Other (See Comments)    Gets a fever   . Sulfasalazine Other (See Comments) and Rash    Gets a fever    VACCINATION STATUS:  There is no immunization history on file for this patient.  Diabetes  She presents for her follow-up diabetic visit. She has type 2 diabetes mellitus. Onset time: Diagnosed at approximate age of 85 yrs. Her disease course has been worsening. There are no hypoglycemic associated symptoms. Pertinent negatives for hypoglycemia include no confusion, headaches, pallor or seizures. There are no diabetic associated symptoms. Pertinent negatives for diabetes include no chest pain and no polyuria. Symptoms are worsening. There are no diabetic complications. Risk factors for coronary artery disease  include dyslipidemia, hypertension and sedentary lifestyle. Current diabetic treatment includes insulin injections and oral agent (monotherapy). Her weight is fluctuating minimally. She is following a generally unhealthy diet. Diabetic meal planning: Admits to dietary indiscretion. She drinks soda regularly. She never participates in exercise. Her home blood glucose trend is decreasing steadily. Her breakfast blood glucose range is generally 180-200 mg/dl. Her overall blood glucose range is 180-200 mg/dl.  Hypertension  This is a chronic problem. The current  episode started more than 1 year ago. Pertinent negatives include no chest pain, headaches, palpitations or shortness of breath. Risk factors for coronary artery disease include family history, dyslipidemia, sedentary lifestyle, diabetes mellitus and post-menopausal state. Past treatments include ACE inhibitors.  Hyperlipidemia  This is a chronic problem. The current episode started more than 1 year ago. Exacerbating diseases include diabetes and obesity. Pertinent negatives include no chest pain, myalgias or shortness of breath. Current antihyperlipidemic treatment includes statins. Risk factors for coronary artery disease include diabetes mellitus, dyslipidemia, hypertension, obesity, post-menopausal, a sedentary lifestyle and family history.  Review of system: Limited as above.  Objective:    There were no vitals taken for this visit.  Wt Readings from Last 3 Encounters:  02/15/18 200 lb 9.6 oz (91 kg)  11/08/17 200 lb (90.7 kg)  07/12/17 200 lb 8 oz (90.9 kg)     Results for orders placed or performed in visit on 02/15/18  Hemoglobin A1c  Result Value Ref Range   Hgb A1c MFr Bld 8.5 (H) <5.7 % of total Hgb   Mean Plasma Glucose 197 (calc)   eAG (mmol/L) 10.9 (calc)  COMPLETE METABOLIC PANEL WITH GFR  Result Value Ref Range   Glucose, Bld 198 (H) 65 - 139 mg/dL   BUN 12 7 - 25 mg/dL   Creat 0.73 0.50 - 0.99 mg/dL   GFR, Est Non  African American 86 > OR = 60 mL/min/1.81m2   GFR, Est African American 100 > OR = 60 mL/min/1.76m2   BUN/Creatinine Ratio NOT APPLICABLE 6 - 22 (calc)   Sodium 138 135 - 146 mmol/L   Potassium 3.8 3.5 - 5.3 mmol/L   Chloride 102 98 - 110 mmol/L   CO2 24 20 - 32 mmol/L   Calcium 9.3 8.6 - 10.4 mg/dL   Total Protein 7.2 6.1 - 8.1 g/dL   Albumin 4.1 3.6 - 5.1 g/dL   Globulin 3.1 1.9 - 3.7 g/dL (calc)   AG Ratio 1.3 1.0 - 2.5 (calc)   Total Bilirubin 0.4 0.2 - 1.2 mg/dL   Alkaline phosphatase (APISO) 65 37 - 153 U/L   AST 49 (H) 10 - 35 U/L   ALT 39 (H) 6 - 29 U/L   Complete Blood Count (Most recent): Lab Results  Component Value Date   WBC 6.9 07/26/2016   HGB 12.7 07/26/2016   HCT 39.4 07/26/2016   MCV 85.5 07/26/2016   PLT 240 07/26/2016   Chemistry (most recent): Lab Results  Component Value Date   NA 138 05/12/2018   K 3.8 05/12/2018   CL 102 05/12/2018   CO2 24 05/12/2018   BUN 12 05/12/2018   CREATININE 0.73 05/12/2018   Diabetic Labs (most recent): Lab Results  Component Value Date   HGBA1C 8.5 (H) 05/12/2018   HGBA1C 8.1 (H) 02/06/2018   HGBA1C 8.0 (A) 09/28/2017   Lipid Panel     Component Value Date/Time   CHOL 176 09/28/2017   TRIG 87 09/28/2017   HDL 41 09/28/2017   CHOLHDL 6.3 (H) 08/04/2016 1157   VLDL 36 (H) 08/04/2016 1157   LDLCALC 118 09/28/2017     Assessment & Plan:   1. Uncontrolled type 2 diabetes mellitus without complication, with long-term current use of insulin ([place])  Her previsit labs show A1c of 8.5%, progressively increasing from 8%.      Recent labs reviewed. Patient remains at a high risk for more acute and chronic complications of diabetes which include CAD, CVA, CKD, retinopathy, and  neuropathy. These are all discussed in detail with the patient. I have re-counseled the patient on diet management and weight loss, by adopting a carbohydrate restricted / protein rich  Diet.   - Patient admits there is a room for  improvement in her diet and drink choices. -  Suggestion is made for her to avoid simple carbohydrates  from her diet including Cakes, Sweet Desserts / Pastries, Ice Cream, Soda (diet and regular), Sweet Tea, Candies, Chips, Cookies, Store Bought Juices, Alcohol in Excess of  1-2 drinks a day, Artificial Sweeteners, and "Sugar-free" Products. This will help patient to have stable blood glucose profile and potentially avoid unintended weight gain.  I have approached patient to continue on  intensive monitoring of blood glucose and insulin therapy, and patient agrees.  -Patient had questionable compliance for monitoring blood glucose.  This makes it difficult to optimize her insulin doses. -She promises to do better and will drop of her logs sometime this week. -She is advised to increase Tresiba to 80 units nightly,    associated with strict monitoring of glucose 2 times a day-daily before breakfast and at bedtime.     - Patient is warned not to take insulin without proper monitoring per orders. - She is urged to bring her meter and logs during each visit.  -Patient is encouraged to call clinic for blood glucose levels less than 70 or above 180 mg /dl. -She is known to have intolerance to metformin.  She will be considered for low-dose glipizide on next visit if A1c remains above 8%. -I discussed and increased her Januvia to 100 mg p.o. daily at breakfast.      2) HTN: she is advised to home monitor blood pressure and report if > 140/90 on 2 separate readings. -She is advised to continue her amlodipine 5 mg p.o. daily, her lisinopril/HCTZ this morning.  She is advised to measure blood pressure at home and report if readings are still above 160/90.    3) HPL: Her recent lipid panel showed improved LDL of 118 from 188.  -She is advised to continue Crestor  10 mg p.o. nightly.     4)  Chronic Care: -Patient is on ACEI and Statin medications and encouraged to continue to follow up with  Ophthalmology, Podiatrist at least yearly or according to recommendations, and advised to stay away from smoking. I have recommended yearly flu vaccine and pneumonia vaccination at least every 5 years; moderate intensity exercise for up to 150 minutes weekly; and  sleep for at least 7 hours a day.  - Patient Care Time Today:  25 min, of which >50% was spent in reviewing her  current and  previous labs/studies, blood glucose readings, previous treatments, and medications doses and developing a plan for long-term care based on the latest recommendations for standards of care.  Rebecca Roberson participated in the discussions, expressed understanding, and voiced agreement with the above plans.  All questions were answered to her satisfaction. she is encouraged to contact clinic should she have any questions or concerns prior to her return visit.  Follow up plan: Return in about 3 months (around 08/17/2018) for Follow up with Pre-visit Labs, Meter, and Logs.  Glade Lloyd, MD Phone: [phone]  Fax: [phone]  -  This note was partially dictated with voice recognition software. Similar sounding words can be transcribed inadequately or may not  be corrected upon review.  05/18/2018, 4:42 PM

## 2018-06-05 ENCOUNTER — Other Ambulatory Visit: Payer: Self-pay | Admitting: "Endocrinology

## 2018-07-05 ENCOUNTER — Other Ambulatory Visit: Payer: Self-pay | Admitting: "Endocrinology

## 2018-07-08 IMAGING — MR MR HEAD W/O CM
9 of 12 series · 28 of 48 positions shown · non-contrast
Comparison: None.

CLINICAL DATA: Dizziness with neck pain and right arm pain over the
last several weeks.

EXAM:
MRI HEAD WITHOUT CONTRAST
TECHNIQUE: Multiplanar, multiecho pulse sequences of the brain and surrounding
structures were obtained without intravenous contrast.

[Series 3: DWI · axial · 3.0mm · 0.74mm/px · z∈[-92,+70]mm · 4 of 54 slices shown (1 of 4)]
[im 1/54]
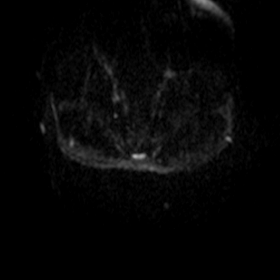
[im 18/54]
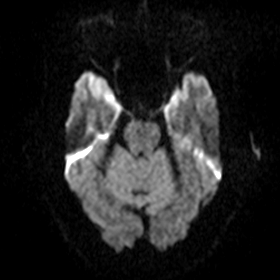
[im 36/54]
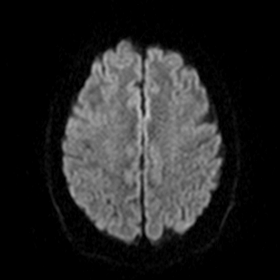
[im 54/54]
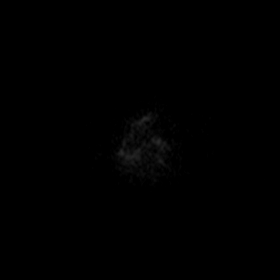

[Series 4: DWI · axial · 3.0mm · 0.74mm/px · z∈[-92,+70]mm · 5 of 55 slices shown (2 of 4)]
[im 1/55]
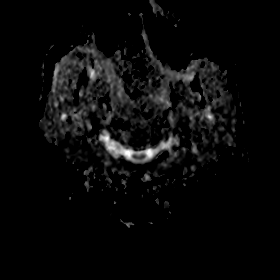
[im 14/55]
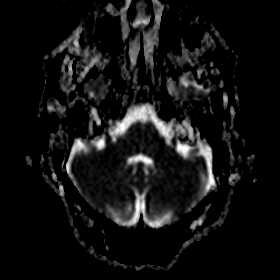
[im 28/55]
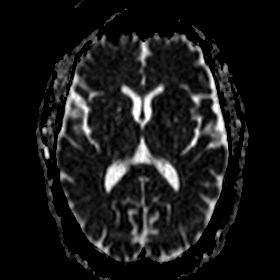
[im 41/55]
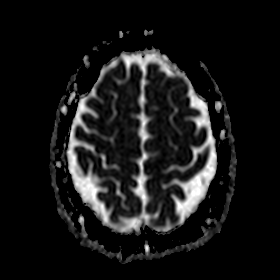
[im 55/55]
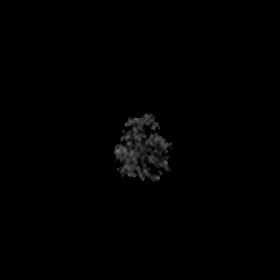

[Series 5: DWI · coronal · 5.0mm · 0.47mm/px · 3 of 36 slices shown (3 of 4)]
[im 1/36]
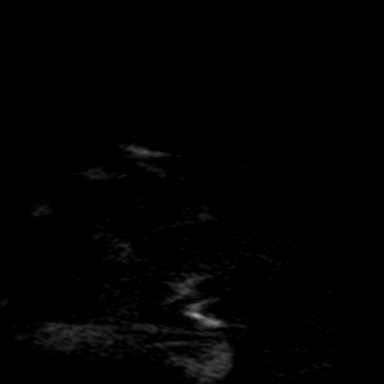
[im 18/36]
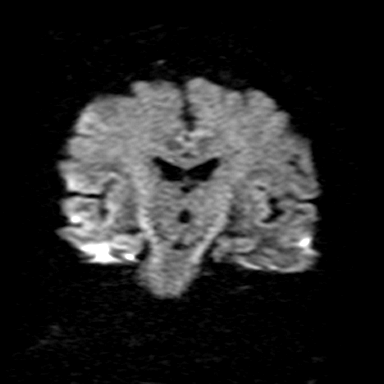
[im 36/36]
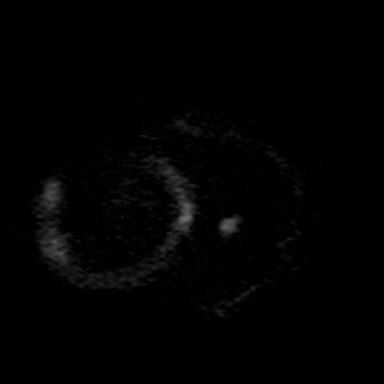

[Series 6: DWI · coronal · 5.0mm · 0.47mm/px · 3 of 36 slices shown (4 of 4)]
[im 1/36]
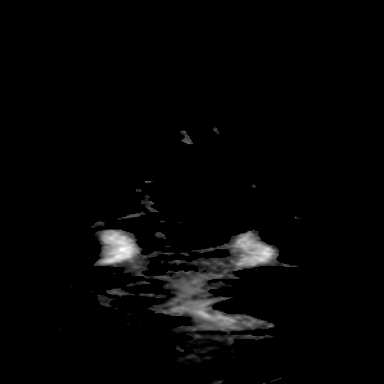
[im 18/36]
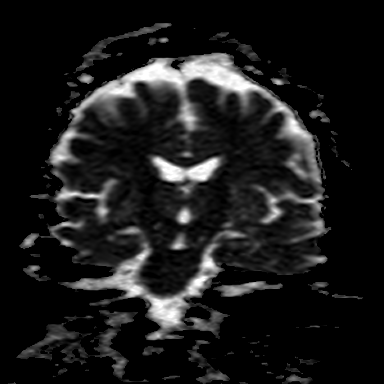
[im 36/36]
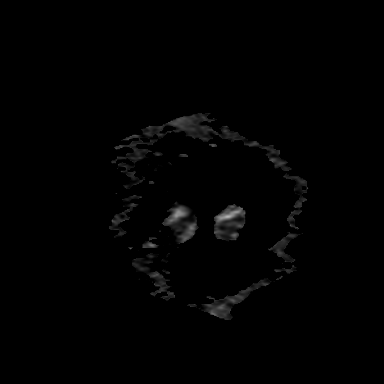

[Series 7: T1 · sagittal · 5.0mm · 0.43mm/px · 2 of 21 slices shown (1 of 2)]
[im 1/21]
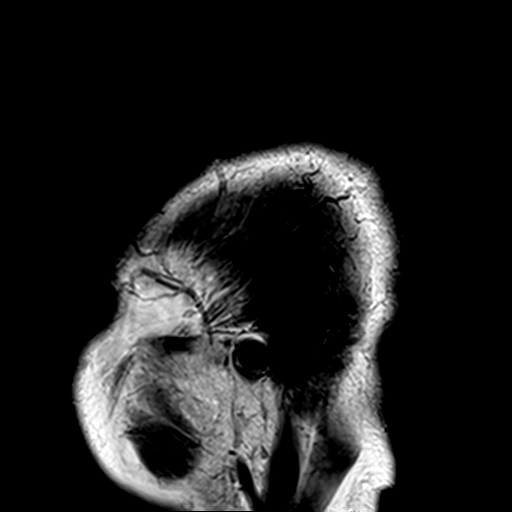
[im 21/21]
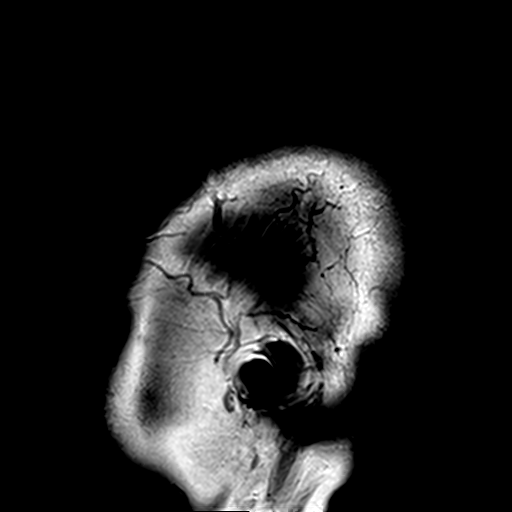

[Series 8: T2 · axial · 5.0mm · 0.50mm/px · z∈[-86,+57]mm · 2 of 23 slices shown (1 of 2)]
[im 1/23]
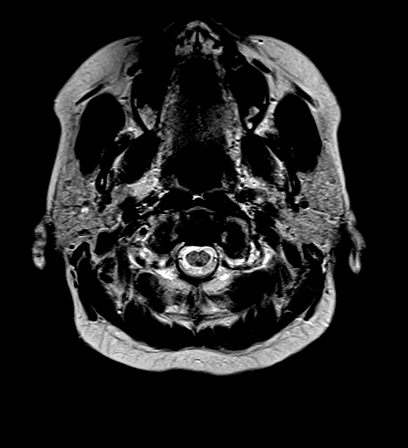
[im 23/23]
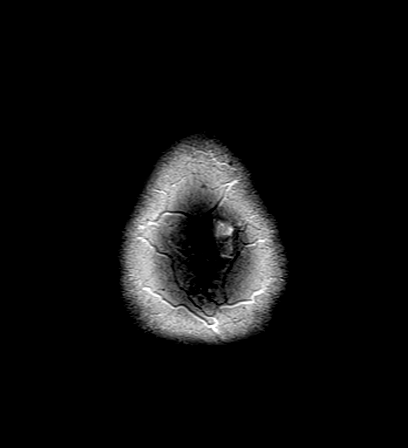

[Series 9: FLAIR · axial · 3.0mm · 0.35mm/px · z∈[-91,+61]mm · 4 of 52 slices shown]
[im 1/52]
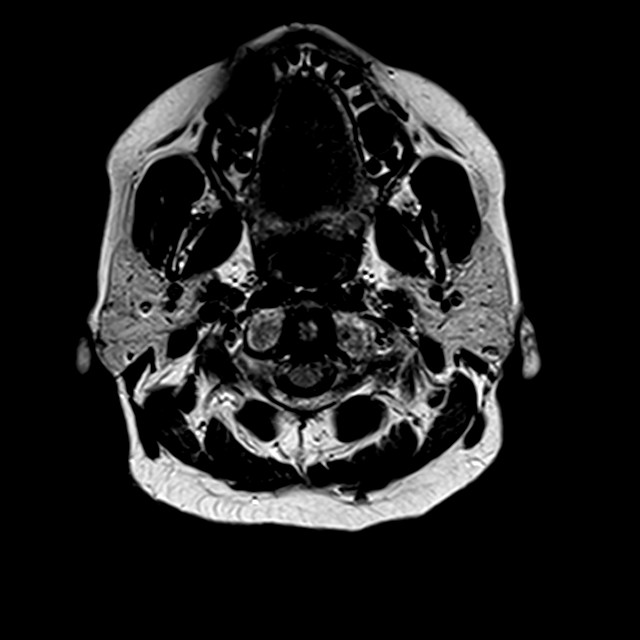
[im 18/52]
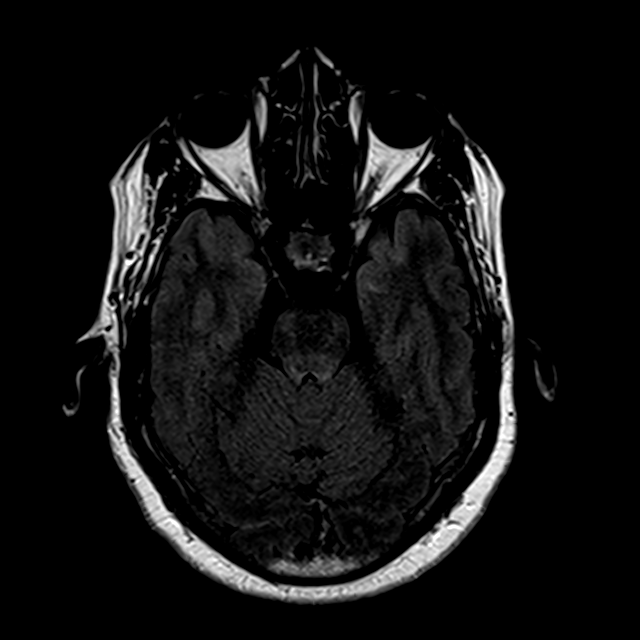
[im 35/52]
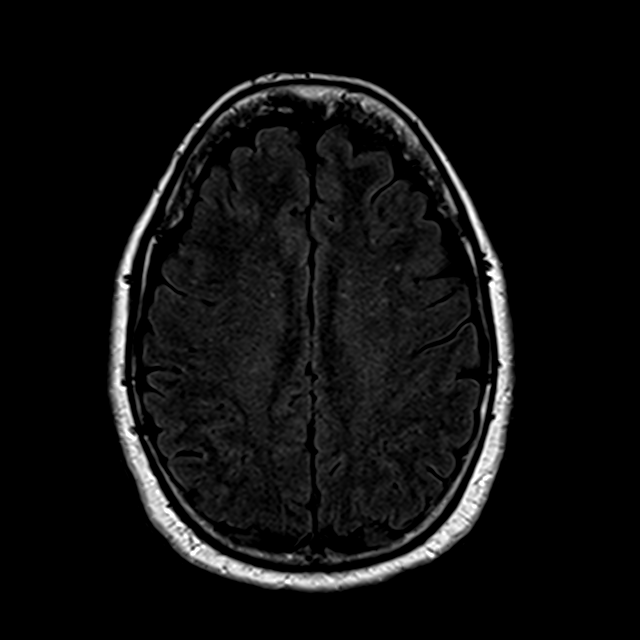
[im 52/52]
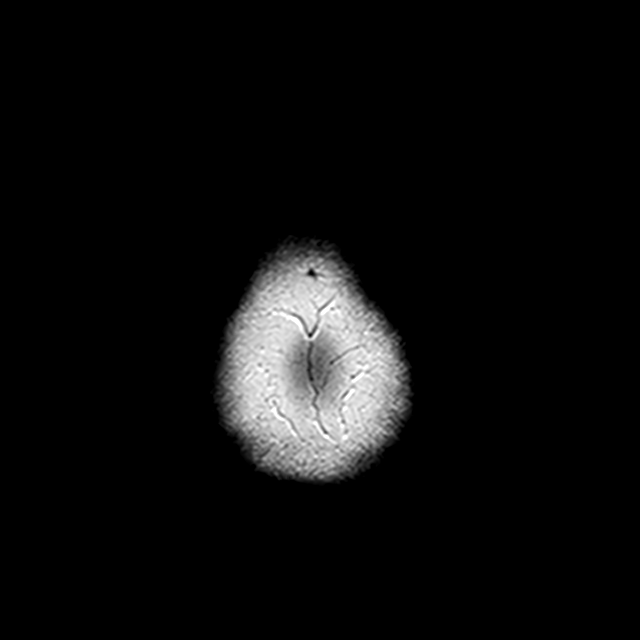

[Series 10: T1 · axial · 2.0mm · 0.45mm/px · z∈[-95,-44]mm · 3 of 81 slices shown (2 of 2)]
[im 1/81]
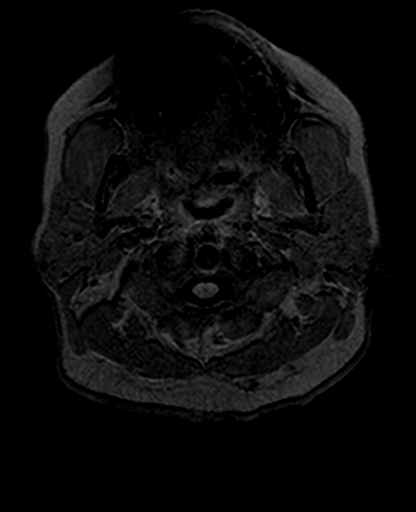
[im 14/81]
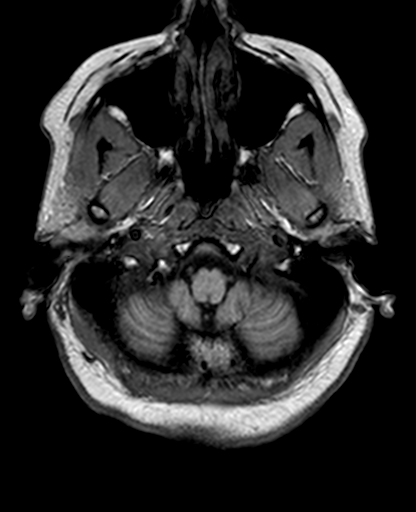
[im 27/81]
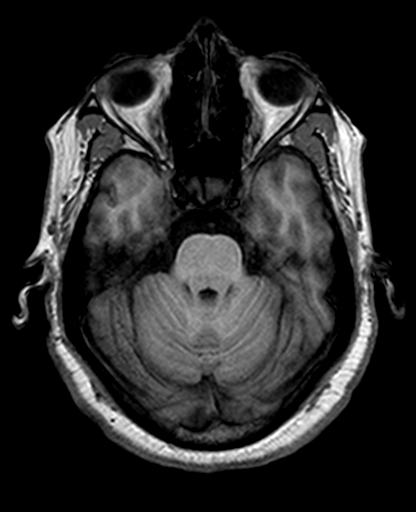

[Series 12: T2 · coronal · 5.0mm · 0.46mm/px · 2 of 28 slices shown (2 of 2)]
[im 1/28]
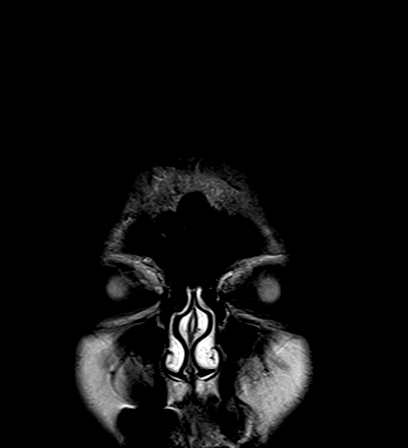
[im 28/28]
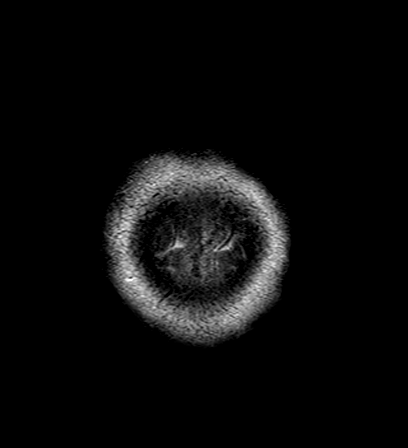

[28 of 48 positions shown; findings below may reference images not displayed]

FINDINGS: Brain: Diffusion imaging does not show any acute or subacute
infarction. There chronic small-vessel ischemic changes of the pons.
There are mild chronic small-vessel ischemic changes of the
hemispheric white matter. No cortical or large vessel territory
infarction. No mass lesion, hemorrhage, hydrocephalus or extra-axial
collection.

Vascular: Major vessels at the base of the brain show flow.

Skull and upper cervical spine: Negative.  Cervical fusion.

Sinuses/Orbits: Clear/normal

Other: None
IMPRESSION: No acute finding. No specific cause of the presenting symptoms is
identified. Chronic small-vessel ischemic changes of the pons and
the cerebral hemispheric white matter.

## 2018-07-18 ENCOUNTER — Other Ambulatory Visit: Payer: BLUE CROSS/BLUE SHIELD

## 2018-07-18 ENCOUNTER — Other Ambulatory Visit: Payer: Self-pay | Admitting: Internal Medicine

## 2018-07-18 DIAGNOSIS — Z20822 Contact with and (suspected) exposure to covid-19: Secondary | ICD-10-CM

## 2018-07-20 LAB — NOVEL CORONAVIRUS, NAA: SARS-CoV-2, NAA: NOT DETECTED

## 2018-08-05 LAB — COMPLETE METABOLIC PANEL WITH GFR
AG Ratio: 1.3 (calc) (ref 1.0–2.5)
ALT: 38 U/L — ABNORMAL HIGH (ref 6–29)
AST: 52 U/L — ABNORMAL HIGH (ref 10–35)
Albumin: 3.9 g/dL (ref 3.6–5.1)
Alkaline phosphatase (APISO): 61 U/L (ref 37–153)
BUN: 14 mg/dL (ref 7–25)
CO2: 29 mmol/L (ref 20–32)
Calcium: 9.3 mg/dL (ref 8.6–10.4)
Chloride: 101 mmol/L (ref 98–110)
Creat: 0.72 mg/dL (ref 0.50–0.99)
GFR, Est African American: 101 mL/min/{1.73_m2} (ref >=60)
GFR, Est Non African American: 87 mL/min/{1.73_m2} (ref >=60)
Globulin: 3 g/dL (calc) (ref 1.9–3.7)
Glucose, Bld: 302 mg/dL — ABNORMAL HIGH (ref 65–139)
Potassium: 3.7 mmol/L (ref 3.5–5.3)
Sodium: 138 mmol/L (ref 135–146)
Total Bilirubin: 0.4 mg/dL (ref 0.2–1.2)
Total Protein: 6.9 g/dL (ref 6.1–8.1)

## 2018-08-05 LAB — HEMOGLOBIN A1C
Hgb A1c MFr Bld: 9 % of total Hgb — ABNORMAL HIGH (ref <5.7)
Mean Plasma Glucose: 212 (calc)
eAG (mmol/L): 11.7 (calc)

## 2018-08-14 ENCOUNTER — Other Ambulatory Visit: Payer: Self-pay | Admitting: Family Medicine

## 2018-08-29 ENCOUNTER — Other Ambulatory Visit: Payer: Self-pay | Admitting: "Endocrinology

## 2018-08-31 ENCOUNTER — Ambulatory Visit (INDEPENDENT_AMBULATORY_CARE_PROVIDER_SITE_OTHER): Payer: BLUE CROSS/BLUE SHIELD | Admitting: "Endocrinology

## 2018-08-31 ENCOUNTER — Encounter: Payer: Self-pay | Admitting: "Endocrinology

## 2018-08-31 ENCOUNTER — Other Ambulatory Visit: Payer: Self-pay

## 2018-08-31 DIAGNOSIS — E782 Mixed hyperlipidemia: Secondary | ICD-10-CM | POA: Diagnosis not present

## 2018-08-31 DIAGNOSIS — Z794 Long term (current) use of insulin: Secondary | ICD-10-CM | POA: Diagnosis not present

## 2018-08-31 DIAGNOSIS — I10 Essential (primary) hypertension: Secondary | ICD-10-CM | POA: Diagnosis not present

## 2018-08-31 DIAGNOSIS — IMO0001 Reserved for inherently not codable concepts without codable children: Secondary | ICD-10-CM

## 2018-08-31 DIAGNOSIS — E1165 Type 2 diabetes mellitus with hyperglycemia: Secondary | ICD-10-CM

## 2018-08-31 MED ORDER — GLIPIZIDE ER 2.5 MG PO TB24: 5.0000 mg | ORAL_TABLET | Freq: Every day | ORAL | 2 refills | Status: DC

## 2018-08-31 NOTE — Progress Notes (Signed)
08/31/2018                                                       Endocrinology Telehealth Visit Follow up Note -During COVID -19 Pandemic  This visit type was conducted due to national recommendations for restrictions regarding the COVID-19 Pandemic  in an effort to limit this patient's exposure and mitigate transmission of the corona virus.  Due to her co-morbid illnesses, MATISON Roberson is at  moderate to high risk for complications without adequate follow up.  This format is felt to be most appropriate for her at this time.  I connected with this patient on 08/31/2018   by telephone and verified that I am speaking with the correct person using two identifiers. Rebecca Roberson, [birth year]. she has verbally consented to this visit. All issues noted in this document were discussed and addressed. The format was not optimal for physical exam.   Subjective:    Patient ID: Rebecca Roberson, female    DOB: 1950-03-23,    Past Medical History:  Diagnosis Date  . Arthritis   . Cervical myelopathy ([place]) 06/30/2016  . Diabetes mellitus without complication ([place])   . GERD (gastroesophageal reflux disease)   . Heart murmur   . History of kidney stones    has one now  . Hyperlipidemia   . Hypertension   . Sleep apnea    cpap have not used 3 yrs   Past Surgical History:  Procedure Laterality Date  . ANTERIOR CERVICAL DECOMP/DISCECTOMY FUSION N/A 07/30/2016   Procedure: ANTERIOR CERVICAL DECOMPRESSION/DISCECTOMY FUSION CERVICAL TWO-THREE;  Surgeon: Ashok Pall, MD;  Location: [facility];  Service: Neurosurgery;  Laterality: N/A;  ANTERIOR CERVICAL DECOMPRESSION/DISCECTOMY FUSION CERVICAL 2- CERVICAL 3  . CERVICAL CONE BIOPSY     Social History   Socioeconomic History  . Marital status: Single    Spouse name: Not on file  . Number of children: 0  . Years of education: 5  . Highest education level: Not on file  Occupational History  . Occupation: Financial controller  Social Needs  . Financial  resource strain: Not on file  . Food insecurity    Worry: Not on file    Inability: Not on file  . Transportation needs    Medical: Not on file    Non-medical: Not on file  Tobacco Use  . Smoking status: Never Smoker  . Smokeless tobacco: Never Used  Substance and Sexual Activity  . Alcohol use: No    Alcohol/week: 0.0 standard drinks  . Drug use: No  . Sexual activity: Not on file  Lifestyle  . Physical activity    Days per week: Not on file    Minutes per session: Not on file  . Stress: Not on file  Relationships  . Social Herbalist on phone: Not on file    Gets together: Not on file    Attends religious service: Not on file    Active member of club or organization: Not on file    Attends meetings of clubs or organizations: Not on file    Relationship status: Not on file  Other Topics Concern  . Not on file  Social History Narrative   Lives alone   Caffeine use: Drinks soda (20oz per day)   Tea sometimes  Right-handed   Outpatient Encounter Medications as of 08/31/2018  Medication Sig  . amLODipine (NORVASC) 5 MG tablet Take by mouth daily.  Marland Kitchen aspirin EC 81 MG tablet Take 81 mg by mouth daily.  . cyclobenzaprine (FLEXERIL) 10 MG tablet Take 10 mg by mouth at bedtime as needed for muscle spasms.   . fluticasone (FLONASE) 50 MCG/ACT nasal spray Place 2 sprays into both nostrils daily.  Marland Kitchen gabapentin (NEURONTIN) 300 MG capsule Take 300 mg by mouth as needed.   Marland Kitchen glipiZIDE (GLUCOTROL XL) 2.5 MG 24 hr tablet Take 2 tablets (5 mg total) by mouth daily with breakfast.  . glucose blood (ONETOUCH VERIO) test strip USE AS DIRECTED FOUR TIMES DAILY  . ibuprofen (ADVIL,MOTRIN) 800 MG tablet Take 800 mg by mouth 3 (three) times daily as needed for moderate pain.   Marland Kitchen insulin degludec (TRESIBA FLEXTOUCH) 100 UNIT/ML SOPN FlexTouch Pen Inject 0.8 mLs (80 Units total) into the skin at bedtime.  Marland Kitchen JANUVIA 100 MG tablet TAKE ONE TABLET BY MOUTH DAILY.  Marland Kitchen  lisinopril-hydrochlorothiazide (PRINZIDE,ZESTORETIC) 20-25 MG per tablet Take 1 tablet by mouth daily.  Marland Kitchen omeprazole (PRILOSEC) 20 MG capsule Take 20 mg by mouth daily.  Rebecca Roberson VERIO test strip USE TO TEST BLOOD SUGAR FOUR TIMES DAILY  . PARoxetine (PAXIL) 20 MG tablet Take 20 mg by mouth daily.  . potassium chloride (K-DUR,KLOR-CON) 10 MEQ tablet Take 10 mEq by mouth daily.  . rosuvastatin (CRESTOR) 10 MG tablet TAKE ONE TABLET BY MOUTH EVERY DAY  . tolterodine (DETROL LA) 4 MG 24 hr capsule Take 4 mg by mouth daily.  . traMADol (ULTRAM) 50 MG tablet Take 2 tablets (100 mg total) by mouth every 6 (six) hours as needed for moderate pain.  Marland Kitchen ULTICARE SHORT PEN NEEDLES 31G X 8 MM MISC USE FOUR TIMES DAILY  . vitamin B-12 (CYANOCOBALAMIN) 500 MCG tablet Take 500 mcg by mouth daily.  . Vitamin D, Ergocalciferol, (DRISDOL) 50000 UNITS CAPS capsule Take 50,000 Units by mouth every Monday.    No facility-administered encounter medications on file as of 08/31/2018.    ALLERGIES: Allergies  Allergen Reactions  . Metformin Diarrhea  . Statins Other (See Comments)    Weak all over , muscle weakness   . Sulfa Antibiotics Rash and Other (See Comments)    Gets a fever   . Sulfasalazine Other (See Comments) and Rash    Gets a fever    VACCINATION STATUS:  There is no immunization history on file for this patient.  Diabetes She presents for her follow-up diabetic visit. She has type 2 diabetes mellitus. Onset time: Diagnosed at approximate age of 11 yrs. Her disease course has been worsening. There are no hypoglycemic associated symptoms. Pertinent negatives for hypoglycemia include no confusion, headaches, pallor or seizures. There are no diabetic associated symptoms. Pertinent negatives for diabetes include no chest pain and no polyuria. Symptoms are worsening. There are no diabetic complications. Risk factors for coronary artery disease include dyslipidemia, hypertension and sedentary lifestyle.  Current diabetic treatment includes insulin injections and oral agent (monotherapy). She is following a generally unhealthy diet. Diabetic meal planning: Admits to dietary indiscretion. She drinks soda regularly. She never participates in exercise. Her home blood glucose trend is decreasing steadily. Her breakfast blood glucose range is generally 180-200 mg/dl. Her overall blood glucose range is 180-200 mg/dl.  Hypertension This is a chronic problem. The current episode started more than 1 year ago. Pertinent negatives include no chest pain, headaches, palpitations or  shortness of breath. Risk factors for coronary artery disease include family history, dyslipidemia, sedentary lifestyle, diabetes mellitus and post-menopausal state. Past treatments include ACE inhibitors.  Hyperlipidemia This is a chronic problem. The current episode started more than 1 year ago. Exacerbating diseases include diabetes and obesity. Pertinent negatives include no chest pain, myalgias or shortness of breath. Current antihyperlipidemic treatment includes statins. Risk factors for coronary artery disease include diabetes mellitus, dyslipidemia, hypertension, obesity, post-menopausal, a sedentary lifestyle and family history.    Review of system: Limited as above.  Objective:    There were no vitals taken for this visit.  Wt Readings from Last 3 Encounters:  02/15/18 200 lb 9.6 oz (91 kg)  11/08/17 200 lb (90.7 kg)  07/12/17 200 lb 8 oz (90.9 kg)     Results for orders placed or performed in visit on 07/18/18  Novel Coronavirus, NAA ([facility])   Specimen: Nasal Swab  Result Value Ref Range   SARS-CoV-2, NAA Not Detected Not Detected   Complete Blood Count (Most recent): Lab Results  Component Value Date   WBC 6.9 07/26/2016   HGB 12.7 07/26/2016   HCT 39.4 07/26/2016   MCV 85.5 07/26/2016   PLT 240 07/26/2016   Chemistry (most recent): Lab Results  Component Value Date   NA 138 08/04/2018   K 3.7  08/04/2018   CL 101 08/04/2018   CO2 29 08/04/2018   BUN 14 08/04/2018   CREATININE 0.72 08/04/2018   Diabetic Labs (most recent): Lab Results  Component Value Date   HGBA1C 9.0 (H) 08/04/2018   HGBA1C 8.5 (H) 05/12/2018   HGBA1C 8.1 (H) 02/06/2018   Lipid Panel     Component Value Date/Time   CHOL 176 09/28/2017   TRIG 87 09/28/2017   HDL 41 09/28/2017   CHOLHDL 6.3 (H) 08/04/2016 1157   VLDL 36 (H) 08/04/2016 1157   LDLCALC 118 09/28/2017     Assessment & Plan:   1. Uncontrolled type 2 diabetes mellitus without complication, with long-term current use of insulin ([place])  Her previsit labs show A1c of 9%, progressively increasing from 8%.   She ran out of her basal insulin for 2 weeks.  After she resumed, she registered at her glycemic profile , between 140-180 at fasting.   Recent labs reviewed. Patient remains at a high risk for more acute and chronic complications of diabetes which include CAD, CVA, CKD, retinopathy, and neuropathy. These are all discussed in detail with the patient. I have re-counseled the patient on diet management and weight loss, by adopting a carbohydrate restricted / protein rich  Diet.  - she  admits there is a room for improvement in her diet and drink choices. -  Suggestion is made for her to avoid simple carbohydrates  from her diet including Cakes, Sweet Desserts / Pastries, Ice Cream, Soda (diet and regular), Sweet Tea, Candies, Chips, Cookies, Sweet Pastries,  Store Bought Juices, Alcohol in Excess of  1-2 drinks a day, Artificial Sweeteners, Coffee Creamer, and "Sugar-free" Products. This will help patient to have stable blood glucose profile and potentially avoid unintended weight gain.   I have approached patient to continue on  intensive monitoring of blood glucose and insulin therapy, and patient agrees.  -Patient had questionable compliance for monitoring blood glucose.  This makes it difficult to optimize her insulin doses.  -She is  advised to continue Tresiba at 80 units nightly,    associated with strict monitoring of glucose 2 times a day-daily before breakfast and  at bedtime.     - Patient is warned not to take insulin without proper monitoring per orders. - She is urged to bring her meter and logs during each visit.  -Patient is encouraged to call clinic for blood glucose levels less than 70 or above 180 mg /dl. -She is known to have intolerance to metformin.  She will be considered for low-dose glipizide on next visit if A1c remains above 8%. -She is advised to continue Januvia 100 mg p.o. daily at breakfast, discussed and added glipizide 2.5 mg p.o. daily at breakfast.     2) HTN: she is advised to home monitor blood pressure and report if > 140/90 on 2 separate readings.  -She is advised to continue her amlodipine 5 mg p.o. daily, her lisinopril/HCTZ this morning.  She is advised to measure blood pressure at home and report if readings are still above 160/90.    3) HPL: Her recent lipid panel showed improved LDL of 118 from 188.  -She is advised to continue Crestor 10 mg p.o. nightly.  4)  Chronic Care: -Patient is on ACEI and Statin medications and encouraged to continue to follow up with Ophthalmology, Podiatrist at least yearly or according to recommendations, and advised to stay away from smoking. I have recommended yearly flu vaccine and pneumonia vaccination at least every 5 years; moderate intensity exercise for up to 150 minutes weekly; and  sleep for at least 7 hours a day.  - Patient Care Time Today:  25 min, of which >50% was spent in  counseling and the rest reviewing her  current and  previous labs/studies, previous treatments, her blood glucose readings, and medications' doses and developing a plan for long-term care based on the latest recommendations for standards of care.   Rebecca Roberson participated in the discussions, expressed understanding, and voiced agreement with the above plans.  All  questions were answered to her satisfaction. she is encouraged to contact clinic should she have any questions or concerns prior to her return visit.   Follow up plan: Return in about 3 months (around 12/01/2018) for Follow up with Pre-visit Labs, Meter, and Logs.  Glade Lloyd, MD Phone: [phone]  Fax: [phone]  -  This note was partially dictated with voice recognition software. Similar sounding words can be transcribed inadequately or may not  be corrected upon review.  08/31/2018, 2:54 PM

## 2018-10-11 ENCOUNTER — Other Ambulatory Visit: Payer: Self-pay | Admitting: "Endocrinology

## 2018-10-24 ENCOUNTER — Other Ambulatory Visit: Payer: Self-pay | Admitting: "Endocrinology

## 2018-12-05 LAB — COMPLETE METABOLIC PANEL WITH GFR
AG Ratio: 1.2 (calc) (ref 1.0–2.5)
ALT: 30 U/L — ABNORMAL HIGH (ref 6–29)
AST: 33 U/L (ref 10–35)
Albumin: 3.8 g/dL (ref 3.6–5.1)
Alkaline phosphatase (APISO): 66 U/L (ref 37–153)
BUN: 17 mg/dL (ref 7–25)
CO2: 33 mmol/L — ABNORMAL HIGH (ref 20–32)
Calcium: 9.5 mg/dL (ref 8.6–10.4)
Chloride: 100 mmol/L (ref 98–110)
Creat: 0.9 mg/dL (ref 0.50–0.99)
GFR, Est African American: 77 mL/min/{1.73_m2} (ref >=60)
GFR, Est Non African American: 67 mL/min/{1.73_m2} (ref >=60)
Globulin: 3.2 g/dL (calc) (ref 1.9–3.7)
Glucose, Bld: 231 mg/dL — ABNORMAL HIGH (ref 65–99)
Potassium: 3.5 mmol/L (ref 3.5–5.3)
Sodium: 141 mmol/L (ref 135–146)
Total Bilirubin: 0.4 mg/dL (ref 0.2–1.2)
Total Protein: 7 g/dL (ref 6.1–8.1)

## 2018-12-05 LAB — HEMOGLOBIN A1C
Hgb A1c MFr Bld: 9.2 % of total Hgb — ABNORMAL HIGH (ref <5.7)
Mean Plasma Glucose: 217 (calc)
eAG (mmol/L): 12 (calc)

## 2018-12-07 ENCOUNTER — Other Ambulatory Visit: Payer: Self-pay

## 2018-12-07 ENCOUNTER — Encounter: Payer: Self-pay | Admitting: "Endocrinology

## 2018-12-07 ENCOUNTER — Ambulatory Visit (INDEPENDENT_AMBULATORY_CARE_PROVIDER_SITE_OTHER): Payer: BC Managed Care – PPO | Admitting: "Endocrinology

## 2018-12-07 DIAGNOSIS — E1165 Type 2 diabetes mellitus with hyperglycemia: Secondary | ICD-10-CM | POA: Insufficient documentation

## 2018-12-07 DIAGNOSIS — E782 Mixed hyperlipidemia: Secondary | ICD-10-CM

## 2018-12-07 DIAGNOSIS — I10 Essential (primary) hypertension: Secondary | ICD-10-CM | POA: Diagnosis not present

## 2018-12-07 MED ORDER — GLIPIZIDE ER 5 MG PO TB24: 5.0000 mg | ORAL_TABLET | Freq: Every day | ORAL | 6 refills | Status: DC

## 2018-12-07 NOTE — Progress Notes (Signed)
12/07/2018                                                       Endocrinology Telehealth Visit Follow up Note -During COVID -19 Pandemic  This visit type was conducted due to national recommendations for restrictions regarding the COVID-19 Pandemic  in an effort to limit this patient's exposure and mitigate transmission of the corona virus.  Due to her co-morbid illnesses, Rebecca Roberson is at  moderate to high risk for complications without adequate follow up.  This format is felt to be most appropriate for her at this time.  I connected with this patient on 12/07/2018   by telephone and verified that I am speaking with the correct person using two identifiers. Rebecca Roberson, February 05, 1950. she has verbally consented to this visit. All issues noted in this document were discussed and addressed. The format was not optimal for physical exam.   Subjective:    Patient ID: Rebecca Roberson, female    DOB: [birth year],    Past Medical History:  Diagnosis Date  . Arthritis   . Cervical myelopathy ([place]) 06/30/2016  . Diabetes mellitus without complication ([place])   . GERD (gastroesophageal reflux disease)   . Heart murmur   . History of kidney stones    has one now  . Hyperlipidemia   . Hypertension   . Sleep apnea    cpap have not used 3 yrs   Past Surgical History:  Procedure Laterality Date  . ANTERIOR CERVICAL DECOMP/DISCECTOMY FUSION N/A 07/30/2016   Procedure: ANTERIOR CERVICAL DECOMPRESSION/DISCECTOMY FUSION CERVICAL TWO-THREE;  Surgeon: Ashok Pall, MD;  Location: [facility];  Service: Neurosurgery;  Laterality: N/A;  ANTERIOR CERVICAL DECOMPRESSION/DISCECTOMY FUSION CERVICAL 2- CERVICAL 3  . CERVICAL CONE BIOPSY     Social History   Socioeconomic History  . Marital status: Single    Spouse name: Not on file  . Number of children: 0  . Years of education: 30  . Highest education level: Not on file  Occupational History  . Occupation: Financial controller  Social Needs  .  Financial resource strain: Not on file  . Food insecurity    Worry: Not on file    Inability: Not on file  . Transportation needs    Medical: Not on file    Non-medical: Not on file  Tobacco Use  . Smoking status: Never Smoker  . Smokeless tobacco: Never Used  Substance and Sexual Activity  . Alcohol use: No    Alcohol/week: 0.0 standard drinks  . Drug use: No  . Sexual activity: Not on file  Lifestyle  . Physical activity    Days per week: Not on file    Minutes per session: Not on file  . Stress: Not on file  Relationships  . Social Herbalist on phone: Not on file    Gets together: Not on file    Attends religious service: Not on file    Active member of club or organization: Not on file    Attends meetings of clubs or organizations: Not on file    Relationship status: Not on file  Other Topics Concern  . Not on file  Social History Narrative   Lives alone   Caffeine use: Drinks soda (20oz per day)   Tea sometimes  Right-handed   Outpatient Encounter Medications as of 12/07/2018  Medication Sig  . amLODipine (NORVASC) 5 MG tablet Take by mouth daily.  Marland Kitchen aspirin EC 81 MG tablet Take 81 mg by mouth daily.  . cyclobenzaprine (FLEXERIL) 10 MG tablet Take 10 mg by mouth at bedtime as needed for muscle spasms.   . fluticasone (FLONASE) 50 MCG/ACT nasal spray Place 2 sprays into both nostrils daily.  Marland Kitchen gabapentin (NEURONTIN) 300 MG capsule Take 300 mg by mouth as needed.   Marland Kitchen glipiZIDE (GLUCOTROL XL) 5 MG 24 hr tablet Take 1 tablet (5 mg total) by mouth daily with breakfast.  . glucose blood (ONETOUCH VERIO) test strip USE AS DIRECTED FOUR TIMES DAILY  . ibuprofen (ADVIL,MOTRIN) 800 MG tablet Take 800 mg by mouth 3 (three) times daily as needed for moderate pain.   Marland Kitchen JANUVIA 100 MG tablet TAKE ONE TABLET BY MOUTH DAILY.  Marland Kitchen lisinopril-hydrochlorothiazide (PRINZIDE,ZESTORETIC) 20-25 MG per tablet Take 1 tablet by mouth daily.  Marland Kitchen omeprazole (PRILOSEC) 20 MG capsule  Take 20 mg by mouth daily.  [name] VERIO test strip USE TO TEST BLOOD SUGAR FOUR TIMES DAILY  . PARoxetine (PAXIL) 20 MG tablet Take 20 mg by mouth daily.  . potassium chloride (K-DUR,KLOR-CON) 10 MEQ tablet Take 10 mEq by mouth daily.  . rosuvastatin (CRESTOR) 10 MG tablet TAKE ONE TABLET BY MOUTH EVERY DAY  . tolterodine (DETROL LA) 4 MG 24 hr capsule Take 4 mg by mouth daily.  . traMADol (ULTRAM) 50 MG tablet Take 2 tablets (100 mg total) by mouth every 6 (six) hours as needed for moderate pain.  . TRESIBA FLEXTOUCH 100 UNIT/ML SOPN FlexTouch Pen INJECT 80 UNITS INTO THE SKIN AT BEDTIME.  Marland Kitchen ULTICARE SHORT PEN NEEDLES 31G X 8 MM MISC USE FOUR TIMES DAILY  . vitamin B-12 (CYANOCOBALAMIN) 500 MCG tablet Take 500 mcg by mouth daily.  . Vitamin D, Ergocalciferol, (DRISDOL) 50000 UNITS CAPS capsule Take 50,000 Units by mouth every Monday.   . [DISCONTINUED] glipiZIDE (GLUCOTROL XL) 2.5 MG 24 hr tablet TAKE TWO (2) TABLETS BY MOUTH DAILY WITH BREAKFAST.   No facility-administered encounter medications on file as of 12/07/2018.    ALLERGIES: Allergies  Allergen Reactions  . Metformin Diarrhea  . Statins Other (See Comments)    Weak all over , muscle weakness   . Sulfa Antibiotics Rash and Other (See Comments)    Gets a fever   . Sulfasalazine Other (See Comments) and Rash    Gets a fever    VACCINATION STATUS:  There is no immunization history on file for this patient.  Diabetes She presents for her follow-up diabetic visit. She has type 2 diabetes mellitus. Onset time: Diagnosed at approximate age of 83 yrs. Her disease course has been worsening. There are no hypoglycemic associated symptoms. Pertinent negatives for hypoglycemia include no confusion, headaches, pallor or seizures. There are no diabetic associated symptoms. Pertinent negatives for diabetes include no chest pain and no polyuria. Symptoms are worsening. There are no diabetic complications. Risk factors for coronary artery  disease include dyslipidemia, hypertension and sedentary lifestyle. Current diabetic treatment includes insulin injections and oral agent (monotherapy). She is following a generally unhealthy diet. Diabetic meal planning: Admits to dietary indiscretion. She drinks soda regularly. She never participates in exercise. Her home blood glucose trend is decreasing steadily. Her breakfast blood glucose range is generally 140-180 mg/dl. Her overall blood glucose range is 180-200 mg/dl.  Hypertension This is a chronic problem. The current episode  started more than 1 year ago. Pertinent negatives include no chest pain, headaches, palpitations or shortness of breath. Risk factors for coronary artery disease include family history, dyslipidemia, sedentary lifestyle, diabetes mellitus and post-menopausal state. Past treatments include ACE inhibitors.  Hyperlipidemia This is a chronic problem. The current episode started more than 1 year ago. Exacerbating diseases include diabetes and obesity. Pertinent negatives include no chest pain, myalgias or shortness of breath. Current antihyperlipidemic treatment includes statins. Risk factors for coronary artery disease include diabetes mellitus, dyslipidemia, hypertension, obesity, post-menopausal, a sedentary lifestyle and family history.    Review of system: Limited as above.  Objective:    There were no vitals taken for this visit.  Wt Readings from Last 3 Encounters:  02/15/18 200 lb 9.6 oz (91 kg)  11/08/17 200 lb (90.7 kg)  07/12/17 200 lb 8 oz (90.9 kg)     Results for orders placed or performed in visit on 08/31/18  Hemoglobin A1c  Result Value Ref Range   Hgb A1c MFr Bld 9.2 (H) <5.7 % of total Hgb   Mean Plasma Glucose 217 (calc)   eAG (mmol/L) 12.0 (calc)  COMPLETE METABOLIC PANEL WITH GFR  Result Value Ref Range   Glucose, Bld 231 (H) 65 - 99 mg/dL   BUN 17 7 - 25 mg/dL   Creat 0.90 0.50 - 0.99 mg/dL   GFR, Est Non African American 67 > OR = 60  mL/min/1.74m2   GFR, Est African American 77 > OR = 60 mL/min/1.34m2   BUN/Creatinine Ratio NOT APPLICABLE 6 - 22 (calc)   Sodium 141 135 - 146 mmol/L   Potassium 3.5 3.5 - 5.3 mmol/L   Chloride 100 98 - 110 mmol/L   CO2 33 (H) 20 - 32 mmol/L   Calcium 9.5 8.6 - 10.4 mg/dL   Total Protein 7.0 6.1 - 8.1 g/dL   Albumin 3.8 3.6 - 5.1 g/dL   Globulin 3.2 1.9 - 3.7 g/dL (calc)   AG Ratio 1.2 1.0 - 2.5 (calc)   Total Bilirubin 0.4 0.2 - 1.2 mg/dL   Alkaline phosphatase (APISO) 66 37 - 153 U/L   AST 33 10 - 35 U/L   ALT 30 (H) 6 - 29 U/L   Complete Blood Count (Most recent): Lab Results  Component Value Date   WBC 6.9 07/26/2016   HGB 12.7 07/26/2016   HCT 39.4 07/26/2016   MCV 85.5 07/26/2016   PLT 240 07/26/2016   Chemistry (most recent): Lab Results  Component Value Date   NA 141 12/04/2018   K 3.5 12/04/2018   CL 100 12/04/2018   CO2 33 (H) 12/04/2018   BUN 17 12/04/2018   CREATININE 0.90 12/04/2018   Diabetic Labs (most recent): Lab Results  Component Value Date   HGBA1C 9.2 (H) 12/04/2018   HGBA1C 9.0 (H) 08/04/2018   HGBA1C 8.5 (H) 05/12/2018   Lipid Panel     Component Value Date/Time   CHOL 176 09/28/2017   TRIG 87 09/28/2017   HDL 41 09/28/2017   CHOLHDL 6.3 (H) 08/04/2016 1157   VLDL 36 (H) 08/04/2016 1157   LDLCALC 118 09/28/2017     Assessment & Plan:   1. Uncontrolled type 2 diabetes mellitus without complication, with long-term current use of insulin ([place])  She reports controlled glycemic profile fasting between 137-170, is not monitoring daily.  Her previsit A1c is 9.2% increasing from 8%.    Recent labs reviewed. Patient remains at a high risk for more acute and chronic complications  of diabetes which include CAD, CVA, CKD, retinopathy, and neuropathy. These are all discussed in detail with the patient. I have re-counseled the patient on diet management and weight loss, by adopting a carbohydrate restricted / protein rich  Diet.  - she  admits  there is a room for improvement in her diet and drink choices. -  Suggestion is made for her to avoid simple carbohydrates  from her diet including Cakes, Sweet Desserts / Pastries, Ice Cream, Soda (diet and regular), Sweet Tea, Candies, Chips, Cookies, Sweet Pastries,  Store Bought Juices, Alcohol in Excess of  1-2 drinks a day, Artificial Sweeteners, Coffee Creamer, and "Sugar-free" Products. This will help patient to have stable blood glucose profile and potentially avoid unintended weight gain.   I have approached patient to continue on  intensive monitoring of blood glucose and insulin therapy, and patient agrees.  -Patient had questionable compliance for monitoring blood glucose.  This makes it difficult to optimize her insulin doses.  -Due to controlled fasting glycemic profile, she is advised to continue Tresiba 80 units nightly, associated with strict monitoring of glucose 2 times a day-daily before breakfast and at bedtime.     - Patient is warned not to take insulin without proper monitoring per orders. - She is urged to bring her meter and logs during each visit.  -Patient is encouraged to call clinic for blood glucose levels less than 70 or above 180 mg /dl. -She is known to have intolerance to metformin.   -She is advised to continue Januvia 100 mg p.o. daily at breakfast. -She has tolerated glipizide 2.5 mg p.o. daily, will increase to 5 mg XL p.o. daily at breakfast.    2) HTN: she is advised to home monitor blood pressure and report if > 140/90 on 2 separate readings. -She is advised to continue her amlodipine 5 mg p.o. daily, her lisinopril/HCTZ this morning.  She is advised to measure blood pressure at home and report if readings are still above 160/90.    3) HPL: Her recent lipid panel showed improved LDL of 118 from 188.  -She is advised to continue Crestor 10 mg p.o. nightly.  4)  Chronic Care: -Patient is on ACEI and Statin medications and encouraged to continue to  follow up with Ophthalmology, Podiatrist at least yearly or according to recommendations, and advised to stay away from smoking. I have recommended yearly flu vaccine and pneumonia vaccination at least every 5 years; moderate intensity exercise for up to 150 minutes weekly; and  sleep for at least 7 hours a day.  - Patient Care Time Today:  25 min, of which >50% was spent in  counseling and the rest reviewing her  current and  previous labs/studies, previous treatments, her blood glucose readings, and medications' doses and developing a plan for long-term care based on the latest recommendations for standards of care.   Rebecca Roberson participated in the discussions, expressed understanding, and voiced agreement with the above plans.  All questions were answered to her satisfaction. she is encouraged to contact clinic should she have any questions or concerns prior to her return visit.  Follow up plan: Return in about 3 months (around 03/09/2019), or include labs, for Bring Meter and Logs- A1c in Office, Include 8 log sheets.  Glade Lloyd, MD Phone: [phone]  Fax: [phone]  -  This note was partially dictated with voice recognition software. Similar sounding words can be transcribed inadequately or may not  be corrected upon review.  12/07/2018, 4:15 PM

## 2019-01-04 ENCOUNTER — Other Ambulatory Visit: Payer: Self-pay | Admitting: "Endocrinology

## 2019-01-08 ENCOUNTER — Other Ambulatory Visit: Payer: Self-pay | Admitting: "Endocrinology

## 2019-02-13 ENCOUNTER — Other Ambulatory Visit: Payer: Self-pay | Admitting: "Endocrinology

## 2019-02-13 DIAGNOSIS — E1165 Type 2 diabetes mellitus with hyperglycemia: Secondary | ICD-10-CM

## 2019-02-20 ENCOUNTER — Other Ambulatory Visit: Payer: Self-pay | Admitting: "Endocrinology

## 2019-03-12 ENCOUNTER — Other Ambulatory Visit: Payer: Self-pay

## 2019-03-12 ENCOUNTER — Ambulatory Visit: Payer: BC Managed Care – PPO | Admitting: "Endocrinology

## 2019-03-12 ENCOUNTER — Encounter: Payer: Self-pay | Admitting: "Endocrinology

## 2019-03-12 VITALS — BP 104/66 | HR 80 | Resp 15 | Ht 64.0 in | Wt 201.0 lb

## 2019-03-12 DIAGNOSIS — I10 Essential (primary) hypertension: Secondary | ICD-10-CM

## 2019-03-12 DIAGNOSIS — E1165 Type 2 diabetes mellitus with hyperglycemia: Secondary | ICD-10-CM | POA: Diagnosis not present

## 2019-03-12 DIAGNOSIS — E782 Mixed hyperlipidemia: Secondary | ICD-10-CM

## 2019-03-12 LAB — HEMOGLOBIN A1C: Hemoglobin A1C: 7.7

## 2019-03-12 LAB — POCT GLYCOSYLATED HEMOGLOBIN (HGB A1C): Hemoglobin A1C: 7.7 % — AB (ref 4.0–5.6)

## 2019-03-12 MED ORDER — TRESIBA FLEXTOUCH 200 UNIT/ML ~~LOC~~ SOPN: 80.0000 [IU] | PEN_INJECTOR | Freq: Every day | SUBCUTANEOUS | 2 refills | Status: DC

## 2019-03-12 NOTE — Progress Notes (Signed)
03/12/2019  Endocrinology follow-up note   Subjective:    Patient ID: Rebecca Roberson, female    DOB: 03/12/1950,    Past Medical History:  Diagnosis Date  . Arthritis   . Cervical myelopathy ([place]) 06/30/2016  . Diabetes mellitus without complication ([place])   . GERD (gastroesophageal reflux disease)   . Heart murmur   . History of kidney stones    has one now  . Hyperlipidemia   . Hypertension   . Sleep apnea    cpap have not used 3 yrs   Past Surgical History:  Procedure Laterality Date  . ANTERIOR CERVICAL DECOMP/DISCECTOMY FUSION N/A 07/30/2016   Procedure: ANTERIOR CERVICAL DECOMPRESSION/DISCECTOMY FUSION CERVICAL TWO-THREE;  Surgeon: Ashok Pall, MD;  Location: [facility];  Service: Neurosurgery;  Laterality: N/A;  ANTERIOR CERVICAL DECOMPRESSION/DISCECTOMY FUSION CERVICAL 2- CERVICAL 3  . CERVICAL CONE BIOPSY     Social History   Socioeconomic History  . Marital status: Single    Spouse name: Not on file  . Number of children: 0  . Years of education: 80  . Highest education level: Not on file  Occupational History  . Occupation: [employer]  Tobacco Use  . Smoking status: Never Smoker  . Smokeless tobacco: Never Used  Substance and Sexual Activity  . Alcohol use: No    Alcohol/week: 0.0 standard drinks  . Drug use: No  . Sexual activity: Not on file  Other Topics Concern  . Not on file  Social History Narrative   Lives alone   Caffeine use: Drinks soda (20oz per day)   Tea sometimes   Right-handed   Social Determinants of Health   Financial Resource Strain:   . Difficulty of Paying Living Expenses: Not on file  Food Insecurity:   . Worried About Charity fundraiser in the Last Year: Not on file  . Ran Out of Food in the Last Year: Not on file  Transportation Needs:   . Lack of Transportation (Medical): Not on file  . Lack of Transportation (Non-Medical): Not on file  Physical Activity:   . Days of Exercise per Week: Not on file  . Minutes of  Exercise per Session: Not on file  Stress:   . Feeling of Stress : Not on file  Social Connections:   . Frequency of Communication with Friends and Family: Not on file  . Frequency of Social Gatherings with Friends and Family: Not on file  . Attends Religious Services: Not on file  . Active Member of Clubs or Organizations: Not on file  . Attends Archivist Meetings: Not on file  . Marital Status: Not on file   Outpatient Encounter Medications as of 03/12/2019  Medication Sig  . amLODipine (NORVASC) 5 MG tablet Take by mouth daily.  Marland Kitchen aspirin EC 81 MG tablet Take 81 mg by mouth daily.  . cyclobenzaprine (FLEXERIL) 10 MG tablet Take 10 mg by mouth at bedtime as needed for muscle spasms.   . fluticasone (FLONASE) 50 MCG/ACT nasal spray Place 2 sprays into both nostrils daily.  Marland Kitchen gabapentin (NEURONTIN) 300 MG capsule Take 300 mg by mouth as needed.   Marland Kitchen glipiZIDE (GLUCOTROL XL) 5 MG 24 hr tablet Take 1 tablet (5 mg total) by mouth daily with breakfast.  . glucose blood (ONETOUCH VERIO) test strip USE AS DIRECTED FOUR TIMES DAILY  . ibuprofen (ADVIL,MOTRIN) 800 MG tablet Take 800 mg by mouth 3 (three) times daily as needed for moderate pain.   Marland Kitchen JANUVIA  100 MG tablet TAKE ONE TABLET BY MOUTH DAILY.  Marland Kitchen lisinopril-hydrochlorothiazide (PRINZIDE,ZESTORETIC) 20-25 MG per tablet Take 1 tablet by mouth daily.  Marland Kitchen omeprazole (PRILOSEC) 20 MG capsule Take 20 mg by mouth daily.  [name] VERIO test strip USE TO TEST BLOOD SUGAR FOUR TIMES DAILY  . PARoxetine (PAXIL) 20 MG tablet Take 20 mg by mouth daily.  . potassium chloride (K-DUR,KLOR-CON) 10 MEQ tablet Take 10 mEq by mouth daily.  . rosuvastatin (CRESTOR) 10 MG tablet TAKE ONE TABLET BY MOUTH EVERY DAY  . tolterodine (DETROL LA) 4 MG 24 hr capsule Take 4 mg by mouth daily.  . traMADol (ULTRAM) 50 MG tablet Take 2 tablets (100 mg total) by mouth every 6 (six) hours as needed for moderate pain.  Marland Kitchen ULTICARE SHORT PEN NEEDLES 31G X 8 MM  MISC USE FOUR TIMES DAILY  . vitamin B-12 (CYANOCOBALAMIN) 500 MCG tablet Take 500 mcg by mouth daily.  . Vitamin D, Ergocalciferol, (DRISDOL) 50000 UNITS CAPS capsule Take 50,000 Units by mouth every Monday.   . [DISCONTINUED] TRESIBA FLEXTOUCH 100 UNIT/ML SOPN FlexTouch Pen INJECT 80 UNITS INTO THE SKIN AT BEDTIME.  . Insulin Degludec (TRESIBA FLEXTOUCH) 200 UNIT/ML SOPN Inject 80 Units into the skin at bedtime.   No facility-administered encounter medications on file as of 03/12/2019.   ALLERGIES: Allergies  Allergen Reactions  . Metformin Diarrhea  . Statins Other (See Comments)    Weak all over , muscle weakness   . Sulfa Antibiotics Rash and Other (See Comments)    Gets a fever   . Sulfasalazine Other (See Comments) and Rash    Gets a fever    VACCINATION STATUS:  There is no immunization history on file for this patient.  Diabetes She presents for her follow-up diabetic visit. She has type 2 diabetes mellitus. Onset time: Diagnosed at approximate age of 66 yrs. Her disease course has been improving. There are no hypoglycemic associated symptoms. Pertinent negatives for hypoglycemia include no confusion, headaches, pallor or seizures. There are no diabetic associated symptoms. Pertinent negatives for diabetes include no chest pain and no polyuria. Symptoms are improving. There are no diabetic complications. Risk factors for coronary artery disease include dyslipidemia, hypertension and sedentary lifestyle. Current diabetic treatment includes insulin injections and oral agent (monotherapy). She is following a generally unhealthy diet. Diabetic meal planning: Admits to dietary indiscretion. She drinks soda regularly. She rarely participates in exercise. Her home blood glucose trend is decreasing steadily. Her breakfast blood glucose range is generally 130-140 mg/dl. Her dinner blood glucose range is generally 140-180 mg/dl. Her overall blood glucose range is 140-180 mg/dl.   Hypertension This is a chronic problem. The current episode started more than 1 year ago. Pertinent negatives include no chest pain, headaches, palpitations or shortness of breath. Risk factors for coronary artery disease include family history, dyslipidemia, sedentary lifestyle, diabetes mellitus and post-menopausal state. Past treatments include ACE inhibitors.  Hyperlipidemia This is a chronic problem. The current episode started more than 1 year ago. Exacerbating diseases include diabetes and obesity. Pertinent negatives include no chest pain, myalgias or shortness of breath. Current antihyperlipidemic treatment includes statins. Risk factors for coronary artery disease include diabetes mellitus, dyslipidemia, hypertension, obesity, post-menopausal, a sedentary lifestyle and family history.     Review of systems  Constitutional: + Minimally fluctuating body weight,  current  Body mass index is 34.5 kg/m. , no fatigue, no subjective hyperthermia, no subjective hypothermia Eyes: no blurry vision, no xerophthalmia ENT: no sore throat, no  nodules palpated in throat, no dysphagia/odynophagia, no hoarseness Cardiovascular: no Chest Pain, no Shortness of Breath, no palpitations, no leg swelling Respiratory: no cough, no shortness of breath Gastrointestinal: no Nausea/Vomiting/Diarhhea Musculoskeletal: no muscle/joint aches Skin: no rashes, no hyperemia Neurological: no tremors, no numbness, no tingling, no dizziness Psychiatric: no depression, no anxiety   Objective:    BP 104/66   Pulse 80   Resp 15   Ht 5\' 4"  (1.626 m)   Wt 201 lb (91.2 kg)   SpO2 95%   BMI 34.50 kg/m   Wt Readings from Last 3 Encounters:  03/12/19 201 lb (91.2 kg)  02/15/18 200 lb 9.6 oz (91 kg)  11/08/17 200 lb (90.7 kg)      Physical Exam- Limited  Constitutional:  Body mass index is 34.5 kg/m. , not in acute distress, normal state of mind Eyes:  EOMI, no exophthalmos Neck: Supple Thyroid: No gross  goiter Respiratory: Adequate breathing efforts Musculoskeletal: no gross deformities, strength intact in all four extremities, no gross restriction of joint movements Skin:  no rashes, no hyperemia Neurological: no tremor with outstretched hands,   Results for orders placed or performed in visit on 03/12/19  Hemoglobin A1c  Result Value Ref Range   Hemoglobin A1C 7.7    Complete Blood Count (Most recent): Lab Results  Component Value Date   WBC 6.9 07/26/2016   HGB 12.7 07/26/2016   HCT 39.4 07/26/2016   MCV 85.5 07/26/2016   PLT 240 07/26/2016   Chemistry (most recent): Lab Results  Component Value Date   NA 141 12/04/2018   K 3.5 12/04/2018   CL 100 12/04/2018   CO2 33 (H) 12/04/2018   BUN 17 12/04/2018   CREATININE 0.90 12/04/2018   Diabetic Labs (most recent): Lab Results  Component Value Date   HGBA1C 7.7 03/12/2019   HGBA1C 9.2 (H) 12/04/2018   HGBA1C 9.0 (H) 08/04/2018   Lipid Panel     Component Value Date/Time   CHOL 176 09/28/2017 0000   TRIG 87 09/28/2017 0000   HDL 41 09/28/2017 0000   CHOLHDL 6.3 (H) 08/04/2016 1157   VLDL 36 (H) 08/04/2016 1157   LDLCALC 118 09/28/2017 0000     Assessment & Plan:   1. Uncontrolled type 2 diabetes mellitus without complication, with long-term current use of insulin ([place])  She returns with improved glycemic profile both fasting and postprandial.  Her point-of-care A1c 7.7%, improving from 9.2%.      Recent labs reviewed. Patient remains at a high risk for more acute and chronic complications of diabetes which include CAD, CVA, CKD, retinopathy, and neuropathy. These are all discussed in detail with the patient. I have re-counseled the patient on diet management and weight loss, by adopting a carbohydrate restricted / protein rich  Diet.  - she  admits there is a room for improvement in her diet and drink choices. -  Suggestion is made for her to avoid simple carbohydrates  from her diet including Cakes, Sweet  Desserts / Pastries, Ice Cream, Soda (diet and regular), Sweet Tea, Candies, Chips, Cookies, Sweet Pastries,  Store Bought Juices, Alcohol in Excess of  1-2 drinks a day, Artificial Sweeteners, Coffee Creamer, and "Sugar-free" Products. This will help patient to have stable blood glucose profile and potentially avoid unintended weight gain.  I have approached patient to continue on  intensive monitoring of blood glucose and insulin therapy, and patient agrees.  -Due to controlled fasting glycemic profile, she is advised to continue Tresiba 80  units nightly, associated with strict monitoring of blood glucose twice a day-daily before breakfast and at bedtime.   - Patient is warned not to take insulin without proper monitoring per orders. - She is urged to bring her meter and logs during each visit.  -Patient is encouraged to call clinic for blood glucose levels less than 70 or above 180 mg /dl. -She is known to have intolerance to metformin.   -She is advised to continue Januvia 100 mg p.o. daily at breakfast.  She is also tolerating glipizide.  She is advised to continue glipizide 5 mg XL p.o. daily at breakfast.      2) HTN: Her blood pressure is controlled to target. -She is advised to continue her amlodipine 5 mg p.o. daily, her lisinopril/HCTZ this morning.    3) HPL: Her recent lipid panel showed improved LDL of 118 from 188.  -She is advised to continue Crestor 10 mg p.o. nightly.  Side effects and precautions discussed with her.    4.  Weight management: Her BMI is 34.  She is a candidate for modest weight loss.  I discussed the fact that-10% weight loss will help significantly impact on her diabetes care.  5)  Chronic Care: -Patient is on ACEI and Statin medications and encouraged to continue to follow up with Ophthalmology, Podiatrist at least yearly or according to recommendations, and advised to stay away from smoking. I have recommended yearly flu vaccine and pneumonia vaccination  at least every 5 years; moderate intensity exercise for up to 150 minutes weekly; and  sleep for at least 7 hours a day.    - Time spent on this patient care encounter:  35 min, of which > 50% was spent in  counseling and the rest reviewing her blood glucose logs , discussing her hypoglycemia and hyperglycemia episodes, reviewing her current and  previous labs / studies  ( including abstraction from other facilities) and medications  doses and developing a  long term treatment plan and documenting her care.   Please refer to Patient Instructions for Blood Glucose Monitoring and Insulin/Medications Dosing Guide"  in media tab for additional information. Please  also refer to " Patient Self Inventory" in the Media  tab for reviewed elements of pertinent patient history.  Rebecca Roberson participated in the discussions, expressed understanding, and voiced agreement with the above plans.  All questions were answered to her satisfaction. she is encouraged to contact clinic should she have any questions or concerns prior to her return visit.   Follow up plan: Return in about 4 months (around 07/10/2019) for Bring Meter and Logs- A1c in Office, Follow up with Pre-visit Labs.  Glade Lloyd, MD Phone: [phone]  Fax: [phone]  -  This note was partially dictated with voice recognition software. Similar sounding words can be transcribed inadequately or may not  be corrected upon review.  03/12/2019, 4:26 PM

## 2019-03-12 NOTE — Patient Instructions (Signed)

## 2019-03-21 ENCOUNTER — Other Ambulatory Visit: Payer: Self-pay | Admitting: "Endocrinology

## 2019-06-21 ENCOUNTER — Other Ambulatory Visit: Payer: Self-pay | Admitting: "Endocrinology

## 2019-06-22 ENCOUNTER — Other Ambulatory Visit: Payer: Self-pay | Admitting: "Endocrinology

## 2019-06-25 ENCOUNTER — Other Ambulatory Visit: Payer: Self-pay | Admitting: "Endocrinology

## 2019-06-30 LAB — COMPLETE METABOLIC PANEL WITH GFR
AG Ratio: 1.3 (calc) (ref 1.0–2.5)
ALT: 31 U/L — ABNORMAL HIGH (ref 6–29)
AST: 32 U/L (ref 10–35)
Albumin: 3.9 g/dL (ref 3.6–5.1)
Alkaline phosphatase (APISO): 57 U/L (ref 37–153)
BUN: 13 mg/dL (ref 7–25)
CO2: 32 mmol/L (ref 20–32)
Calcium: 9.2 mg/dL (ref 8.6–10.4)
Chloride: 101 mmol/L (ref 98–110)
Creat: 0.66 mg/dL (ref 0.50–0.99)
GFR, Est African American: 106 mL/min/{1.73_m2} (ref >=60)
GFR, Est Non African American: 91 mL/min/{1.73_m2} (ref >=60)
Globulin: 3.1 g/dL (calc) (ref 1.9–3.7)
Glucose, Bld: 161 mg/dL — ABNORMAL HIGH (ref 65–99)
Potassium: 3.7 mmol/L (ref 3.5–5.3)
Sodium: 139 mmol/L (ref 135–146)
Total Bilirubin: 0.4 mg/dL (ref 0.2–1.2)
Total Protein: 7 g/dL (ref 6.1–8.1)

## 2019-06-30 LAB — LIPID PANEL
Cholesterol: 204 mg/dL — ABNORMAL HIGH (ref <200)
HDL: 42 mg/dL — ABNORMAL LOW (ref >=50)
LDL Cholesterol (Calc): 142 mg/dL (calc) — ABNORMAL HIGH
Non-HDL Cholesterol (Calc): 162 mg/dL (calc) — ABNORMAL HIGH (ref <130)
Total CHOL/HDL Ratio: 4.9 (calc) (ref <5.0)
Triglycerides: 90 mg/dL (ref <150)

## 2019-06-30 LAB — ADVANCED WRITTEN NOTIFICATION (AWN) TEST REFUSAL: AWN TEST REFUSED: 866

## 2019-06-30 LAB — TSH: TSH: 0.8 mIU/L (ref 0.40–4.50)

## 2019-06-30 LAB — MICROALBUMIN / CREATININE URINE RATIO
Creatinine, Urine: 194 mg/dL (ref 20–275)
Microalb Creat Ratio: 5 mcg/mg creat (ref <30)
Microalb, Ur: 0.9 mg/dL

## 2019-06-30 LAB — VITAMIN D 25 HYDROXY (VIT D DEFICIENCY, FRACTURES): Vit D, 25-Hydroxy: 90 ng/mL (ref 30–100)

## 2019-07-11 ENCOUNTER — Other Ambulatory Visit: Payer: Self-pay

## 2019-07-11 ENCOUNTER — Ambulatory Visit: Payer: BC Managed Care – PPO | Admitting: "Endocrinology

## 2019-07-11 ENCOUNTER — Encounter: Payer: Self-pay | Admitting: "Endocrinology

## 2019-07-11 VITALS — BP 124/76 | HR 91 | Ht 64.0 in | Wt 200.4 lb

## 2019-07-11 DIAGNOSIS — I10 Essential (primary) hypertension: Secondary | ICD-10-CM | POA: Diagnosis not present

## 2019-07-11 DIAGNOSIS — E1165 Type 2 diabetes mellitus with hyperglycemia: Secondary | ICD-10-CM

## 2019-07-11 DIAGNOSIS — E782 Mixed hyperlipidemia: Secondary | ICD-10-CM | POA: Diagnosis not present

## 2019-07-11 LAB — POCT GLYCOSYLATED HEMOGLOBIN (HGB A1C): Hemoglobin A1C: 8.2 % — AB (ref 4.0–5.6)

## 2019-07-11 MED ORDER — TRESIBA FLEXTOUCH 200 UNIT/ML ~~LOC~~ SOPN: 90.0000 [IU] | PEN_INJECTOR | Freq: Every day | SUBCUTANEOUS | 2 refills | Status: DC

## 2019-07-11 NOTE — Progress Notes (Signed)
07/11/2019  Endocrinology follow-up note   Subjective:    Patient ID: Rebecca Roberson, female    DOB: 1950/11/04,    Past Medical History:  Diagnosis Date  . Arthritis   . Cervical myelopathy ([place]) 06/30/2016  . Diabetes mellitus without complication ([place])   . GERD (gastroesophageal reflux disease)   . Heart murmur   . History of kidney stones    has one now  . Hyperlipidemia   . Hypertension   . Sleep apnea    cpap have not used 3 yrs   Past Surgical History:  Procedure Laterality Date  . ANTERIOR CERVICAL DECOMP/DISCECTOMY FUSION N/A 07/30/2016   Procedure: ANTERIOR CERVICAL DECOMPRESSION/DISCECTOMY FUSION CERVICAL TWO-THREE;  Surgeon: Ashok Pall, MD;  Location: [facility];  Service: Neurosurgery;  Laterality: N/A;  ANTERIOR CERVICAL DECOMPRESSION/DISCECTOMY FUSION CERVICAL 2- CERVICAL 3  . CERVICAL CONE BIOPSY     Social History   Socioeconomic History  . Marital status: Single    Spouse name: Not on file  . Number of children: 0  . Years of education: 60  . Highest education level: Not on file  Occupational History  . Occupation: [employer]  Tobacco Use  . Smoking status: Never Smoker  . Smokeless tobacco: Never Used  Vaping Use  . Vaping Use: Never used  Substance and Sexual Activity  . Alcohol use: No    Alcohol/week: 0.0 standard drinks  . Drug use: No  . Sexual activity: Not on file  Other Topics Concern  . Not on file  Social History Narrative   Lives alone   Caffeine use: Drinks soda (20oz per day)   Tea sometimes   Right-handed   Social Determinants of Health   Financial Resource Strain:   . Difficulty of Paying Living Expenses:   Food Insecurity:   . Worried About Charity fundraiser in the Last Year:   . Arboriculturist in the Last Year:   Transportation Needs:   . Film/video editor (Medical):   Marland Kitchen Lack of Transportation (Non-Medical):   Physical Activity:   . Days of Exercise per Week:   . Minutes of Exercise per Session:    Stress:   . Feeling of Stress :   Social Connections:   . Frequency of Communication with Friends and Family:   . Frequency of Social Gatherings with Friends and Family:   . Attends Religious Services:   . Active Member of Clubs or Organizations:   . Attends Archivist Meetings:   Marland Kitchen Marital Status:    Outpatient Encounter Medications as of 07/11/2019  Medication Sig  . amLODipine (NORVASC) 5 MG tablet Take by mouth daily.  Marland Kitchen aspirin EC 81 MG tablet Take 81 mg by mouth daily.  . cyclobenzaprine (FLEXERIL) 10 MG tablet Take 10 mg by mouth at bedtime as needed for muscle spasms.   . fluticasone (FLONASE) 50 MCG/ACT nasal spray Place 2 sprays into both nostrils daily.  Marland Kitchen gabapentin (NEURONTIN) 300 MG capsule Take 300 mg by mouth as needed.   Marland Kitchen glipiZIDE (GLUCOTROL XL) 5 MG 24 hr tablet TAKE ONE TABLET BY MOUTH DAILY WITH BREAKFAST  . glucose blood (ONETOUCH VERIO) test strip USE AS DIRECTED FOUR TIMES DAILY  . ibuprofen (ADVIL,MOTRIN) 800 MG tablet Take 800 mg by mouth 3 (three) times daily as needed for moderate pain.   Marland Kitchen insulin degludec (TRESIBA FLEXTOUCH) 200 UNIT/ML FlexTouch Pen Inject 90 Units into the skin at bedtime.  . Insulin Pen Needle (ULTICARE SHORT  PEN NEEDLES) 31G X 8 MM MISC USE AS DIRECTED DAILY.  Marland Kitchen JANUVIA 100 MG tablet Take 1 tablet by mouth once daily  . LINZESS 72 MCG capsule Take 72 mcg by mouth daily.  Marland Kitchen lisinopril-hydrochlorothiazide (PRINZIDE,ZESTORETIC) 20-25 MG per tablet Take 1 tablet by mouth daily.  Marland Kitchen omeprazole (PRILOSEC) 20 MG capsule Take 20 mg by mouth daily.  [name] VERIO test strip USE TO TEST BLOOD SUGAR FOUR TIMES DAILY  . PARoxetine (PAXIL) 20 MG tablet Take 20 mg by mouth daily.  . potassium chloride (K-DUR,KLOR-CON) 10 MEQ tablet Take 10 mEq by mouth daily.  . rosuvastatin (CRESTOR) 10 MG tablet TAKE ONE TABLET BY MOUTH EVERY DAY  . tolterodine (DETROL LA) 4 MG 24 hr capsule Take 4 mg by mouth daily.  . traMADol (ULTRAM) 50 MG tablet  Take 2 tablets (100 mg total) by mouth every 6 (six) hours as needed for moderate pain.  . Vitamin D, Ergocalciferol, (DRISDOL) 50000 UNITS CAPS capsule Take 50,000 Units by mouth every Monday.   . [DISCONTINUED] Insulin Degludec (TRESIBA FLEXTOUCH) 200 UNIT/ML SOPN Inject 80 Units into the skin at bedtime.  . [DISCONTINUED] TRESIBA FLEXTOUCH 100 UNIT/ML FlexTouch Pen INJECT 80 UNITS SUBCUTANEOUSLY AT BEDTIME  . [DISCONTINUED] vitamin B-12 (CYANOCOBALAMIN) 500 MCG tablet Take 500 mcg by mouth daily.   No facility-administered encounter medications on file as of 07/11/2019.   ALLERGIES: Allergies  Allergen Reactions  . Metformin Diarrhea  . Statins Other (See Comments)    Weak all over , muscle weakness   . Sulfa Antibiotics Rash and Other (See Comments)    Gets a fever   . Sulfasalazine Other (See Comments) and Rash    Gets a fever    VACCINATION STATUS:  There is no immunization history on file for this patient.  Diabetes She presents for her follow-up diabetic visit. She has type 2 diabetes mellitus. Onset time: Diagnosed at approximate age of 29 yrs. Her disease course has been worsening. There are no hypoglycemic associated symptoms. Pertinent negatives for hypoglycemia include no confusion, headaches, pallor or seizures. There are no diabetic associated symptoms. Pertinent negatives for diabetes include no chest pain and no polyuria. Symptoms are worsening. There are no diabetic complications. Risk factors for coronary artery disease include dyslipidemia, hypertension and sedentary lifestyle. Current diabetic treatment includes insulin injections and oral agent (monotherapy). Her weight is fluctuating minimally. She is following a generally unhealthy diet. Diabetic meal planning: Admits to dietary indiscretion. She drinks soda regularly. She rarely participates in exercise. Her home blood glucose trend is increasing steadily. Her breakfast blood glucose range is generally 140-180 mg/dl.  Her dinner blood glucose range is generally 140-180 mg/dl. Her overall blood glucose range is 140-180 mg/dl. (She presents with above target glycemic profile.  Her point-of-care A1c is 8.2% increasing from 7.7%.)  Hypertension This is a chronic problem. The current episode started more than 1 year ago. Pertinent negatives include no chest pain, headaches, palpitations or shortness of breath. Risk factors for coronary artery disease include family history, dyslipidemia, sedentary lifestyle, diabetes mellitus and post-menopausal state. Past treatments include ACE inhibitors.  Hyperlipidemia This is a chronic problem. The current episode started more than 1 year ago. Exacerbating diseases include diabetes and obesity. Pertinent negatives include no chest pain, myalgias or shortness of breath. Current antihyperlipidemic treatment includes statins. Risk factors for coronary artery disease include diabetes mellitus, dyslipidemia, hypertension, obesity, post-menopausal, a sedentary lifestyle and family history.      Review of systems  Constitutional: +  Minimally fluctuating body weight,  current  Body mass index is 34.4 kg/m. , no fatigue, no subjective hyperthermia, no subjective hypothermia Eyes: no blurry vision, no xerophthalmia ENT: no sore throat, no nodules palpated in throat, no dysphagia/odynophagia, no hoarseness Cardiovascular: no Chest Pain, no Shortness of Breath, no palpitations, no leg swelling Respiratory: no cough, no shortness of breath Gastrointestinal: no Nausea/Vomiting/Diarhhea Musculoskeletal: no muscle/joint aches Skin: no rashes, no hyperemia Neurological: no tremors, no numbness, no tingling, no dizziness Psychiatric: no depression, no anxiety    Objective:    BP 124/76   Pulse 91   Ht 5\' 4"  (1.626 m)   Wt 200 lb 6.4 oz (90.9 kg)   BMI 34.40 kg/m   Wt Readings from Last 3 Encounters:  07/11/19 200 lb 6.4 oz (90.9 kg)  03/12/19 201 lb (91.2 kg)  02/15/18 200 lb  9.6 oz (91 kg)      Physical Exam- Limited  Constitutional:  Body mass index is 34.4 kg/m. , not in acute distress, normal state of mind Eyes:  EOMI, no exophthalmos Neck: Supple Thyroid: No gross goiter Respiratory: Adequate breathing efforts Musculoskeletal: no gross deformities, strength intact in all four extremities, no gross restriction of joint movements Skin:  no rashes, no hyperemia Neurological: no tremor with outstretched hands,    Results for orders placed or performed in visit on 07/11/19  HgB A1c  Result Value Ref Range   Hemoglobin A1C 8.2 (A) 4.0 - 5.6 %   HbA1c POC (<> result, manual entry)     HbA1c, POC (prediabetic range)     HbA1c, POC (controlled diabetic range)     Complete Blood Count (Most recent): Lab Results  Component Value Date   WBC 6.9 07/26/2016   HGB 12.7 07/26/2016   HCT 39.4 07/26/2016   MCV 85.5 07/26/2016   PLT 240 07/26/2016   Chemistry (most recent): Lab Results  Component Value Date   NA 139 06/29/2019   K 3.7 06/29/2019   CL 101 06/29/2019   CO2 32 06/29/2019   BUN 13 06/29/2019   CREATININE 0.66 06/29/2019   Diabetic Labs (most recent): Lab Results  Component Value Date   HGBA1C 8.2 (A) 07/11/2019   HGBA1C 7.7 (A) 03/12/2019   HGBA1C 7.7 03/12/2019   Lipid Panel     Component Value Date/Time   CHOL 204 (H) 06/29/2019 0819   TRIG 90 06/29/2019 0819   HDL 42 (L) 06/29/2019 0819   CHOLHDL 4.9 06/29/2019 0819   VLDL 36 (H) 08/04/2016 1157   LDLCALC 142 (H) 06/29/2019 0819     Assessment & Plan:   1. Uncontrolled type 2 diabetes mellitus without complication, with long-term current use of insulin ([place])  She presents with above target glycemic profile.  Her point-of-care A1c is 8.2% increasing from 7.7%.   Recent labs reviewed. Patient remains at a high risk for more acute and chronic complications of diabetes which include CAD, CVA, CKD, retinopathy, and neuropathy. These are all discussed in detail with the  patient. I have re-counseled the patient on diet management and weight loss, by adopting a carbohydrate restricted / protein rich  Diet.  - she  admits there is a room for improvement in her diet and drink choices. -  Suggestion is made for her to avoid simple carbohydrates  from her diet including Cakes, Sweet Desserts / Pastries, Ice Cream, Soda (diet and regular), Sweet Tea, Candies, Chips, Cookies, Sweet Pastries,  Store Bought Juices, Alcohol in Excess of  1-2 drinks a  day, Artificial Sweeteners, Coffee Creamer, and "Sugar-free" Products. This will help patient to have stable blood glucose profile and potentially avoid unintended weight gain.   I have approached patient to continue on  intensive monitoring of blood glucose and insulin therapy, and patient agrees.  -Due to above target fasting glycemic profile, she is advised to increase her Tresiba to 90 units nightly,  associated with strict monitoring of blood glucose twice a day-daily before breakfast and at bedtime.   - Patient is warned not to take insulin without proper monitoring per orders. - She is urged to bring her meter and logs during each visit.  -Patient is encouraged to call clinic for blood glucose levels less than 70 or above 180 mg /dl. -She is known to have intolerance to metformin.   -She is advised to continue Januvia 100 mg p.o. daily at breakfast, and glipizide 5 mg XL p.o. daily at breakfast.   2) HTN: Her blood pressure is controlled to target. -She is advised to continue her amlodipine 5 mg p.o. daily, her lisinopril/HCTZ this morning.    3) HPL: Her recent lipid panel showed improved LDL of 118 from 188.  -She is advised to continue Crestor 10 mg p.o. nightly.  Side effects and precautions discussed with him.     4.  Weight management: Her BMI is 34.  She is a candidate for modest weight loss.  I discussed the fact that-10% weight loss will help significantly impact on her diabetes care.  5)  Chronic  Care: -Patient is on ACEI and Statin medications and encouraged to continue to follow up with Ophthalmology, Podiatrist at least yearly or according to recommendations, and advised to stay away from smoking. I have recommended yearly flu vaccine and pneumonia vaccination at least every 5 years; moderate intensity exercise for up to 150 minutes weekly; and  sleep for at least 7 hours a day.  She is advised to maintain close follow-up with her PMD Catalina Antigua, MD  - Time spent on this patient care encounter:  35 min, of which > 50% was spent in  counseling and the rest reviewing her blood glucose logs , discussing her hypoglycemia and hyperglycemia episodes, reviewing her current and  previous labs / studies  ( including abstraction from other facilities) and medications  doses and developing a  long term treatment plan and documenting her care.   Please refer to Patient Instructions for Blood Glucose Monitoring and Insulin/Medications Dosing Guide"  in media tab for additional information. Please  also refer to " Patient Self Inventory" in the Media  tab for reviewed elements of pertinent patient history.  Rebecca Roberson participated in the discussions, expressed understanding, and voiced agreement with the above plans.  All questions were answered to her satisfaction. she is encouraged to contact clinic should she have any questions or concerns prior to her return visit.    Follow up plan: Return in about 3 months (around 10/11/2019) for Bring Meter and Logs- A1c in Office.  Glade Lloyd, MD Phone: [phone]  Fax: [phone]  -  This note was partially dictated with voice recognition software. Similar sounding words can be transcribed inadequately or may not  be corrected upon review.  07/11/2019, 6:21 PM

## 2019-07-11 NOTE — Patient Instructions (Signed)

## 2019-07-13 ENCOUNTER — Other Ambulatory Visit: Payer: Self-pay

## 2019-07-13 DIAGNOSIS — N200 Calculus of kidney: Secondary | ICD-10-CM

## 2019-08-11 ENCOUNTER — Other Ambulatory Visit: Payer: Self-pay | Admitting: "Endocrinology

## 2019-08-17 ENCOUNTER — Other Ambulatory Visit: Payer: Self-pay | Admitting: "Endocrinology

## 2019-08-17 ENCOUNTER — Telehealth: Payer: Self-pay | Admitting: "Endocrinology

## 2019-08-17 MED ORDER — TRESIBA FLEXTOUCH 200 UNIT/ML ~~LOC~~ SOPN: 90.0000 [IU] | PEN_INJECTOR | Freq: Every day | SUBCUTANEOUS | 2 refills | Status: DC

## 2019-08-17 NOTE — Telephone Encounter (Signed)
Pt said her insulin was denied. It says because the doseage was inappropriate. What does she need?

## 2019-08-17 NOTE — Telephone Encounter (Signed)
Rx sent to [facility]

## 2019-08-28 ENCOUNTER — Other Ambulatory Visit: Payer: Self-pay

## 2019-08-28 ENCOUNTER — Ambulatory Visit (HOSPITAL_COMMUNITY)
Admission: RE | Admit: 2019-08-28 | Discharge: 2019-08-28 | Disposition: A | Discharge status: Home / Self Care | Payer: BC Managed Care – PPO | Source: Ambulatory Visit | Attending: Urology | Admitting: Urology

## 2019-08-28 DIAGNOSIS — N200 Calculus of kidney: Secondary | ICD-10-CM | POA: Diagnosis not present

## 2019-09-06 NOTE — Progress Notes (Signed)
Subjective:  1. Calculus, renal     I have kidney stones. HPI: Rebecca Roberson is a 69 year-old female established patient who is here for renal calculi.   Rebecca Roberson returns today in f/u for her history of stones. She had a KUB on 08/28/19 and was found to have a stable small RUP stone.  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    02/25/2017: No stone events since last visit. KUB shows stable 54mm right upper pole calculus.         ROS:  ROS:  A complete review of systems was performed.  All systems are negative except for pertinent findings as noted.   ROS  Allergies  Allergen Reactions  . Metformin Diarrhea  . Statins Other (See Comments)    Weak all over , muscle weakness   . Sulfa Antibiotics Rash and Other (See Comments)    Gets a fever   . Sulfasalazine Other (See Comments) and Rash    Gets a fever     Outpatient Encounter Medications as of 09/07/2019  Medication Sig  . amLODipine (NORVASC) 5 MG tablet Take by mouth daily.  Marland Kitchen aspirin EC 81 MG tablet Take 81 mg by mouth daily.  . cyclobenzaprine (FLEXERIL) 10 MG tablet Take 10 mg by mouth at bedtime as needed for muscle spasms.   . fluticasone (FLONASE) 50 MCG/ACT nasal spray Place 2 sprays into both nostrils daily.  Marland Kitchen gabapentin (NEURONTIN) 300 MG capsule Take 300 mg by mouth as needed.   Marland Kitchen glipiZIDE (GLUCOTROL XL) 5 MG 24 hr tablet TAKE ONE TABLET BY MOUTH DAILY WITH BREAKFAST  . glucose blood (ONETOUCH VERIO) test strip USE AS DIRECTED FOUR TIMES DAILY  . ibuprofen (ADVIL,MOTRIN) 800 MG tablet Take 800 mg by mouth 3 (three) times daily as needed for moderate pain.   Marland Kitchen insulin degludec (TRESIBA FLEXTOUCH) 200 UNIT/ML FlexTouch Pen Inject 90 Units into the skin at bedtime.  . Insulin Pen Needle (ULTICARE SHORT PEN NEEDLES) 31G X 8 MM MISC USE AS DIRECTED DAILY.  Marland Kitchen JANUVIA 100 MG tablet Take 1 tablet by mouth once daily  . LINZESS 72 MCG capsule Take 72 mcg by mouth daily.  Marland Kitchen lisinopril-hydrochlorothiazide  (PRINZIDE,ZESTORETIC) 20-25 MG per tablet Take 1 tablet by mouth daily.  Marland Kitchen omeprazole (PRILOSEC) 20 MG capsule Take 20 mg by mouth daily.  [name] VERIO test strip USE TO TEST BLOOD SUGAR FOUR TIMES DAILY  . PARoxetine (PAXIL) 20 MG tablet Take 20 mg by mouth daily.  . potassium chloride (K-DUR,KLOR-CON) 10 MEQ tablet Take 10 mEq by mouth daily.  . rosuvastatin (CRESTOR) 10 MG tablet TAKE ONE TABLET BY MOUTH EVERY DAY  . tolterodine (DETROL LA) 4 MG 24 hr capsule Take 4 mg by mouth daily.  . traMADol (ULTRAM) 50 MG tablet Take 2 tablets (100 mg total) by mouth every 6 (six) hours as needed for moderate pain.  . Vitamin D, Ergocalciferol, (DRISDOL) 50000 UNITS CAPS capsule Take 50,000 Units by mouth every Monday.    No facility-administered encounter medications on file as of 09/07/2019.    Past Medical History:  Diagnosis Date  . Arthritis   . Cervical myelopathy ([place]) 06/30/2016  . Diabetes mellitus without complication ([place])   . GERD (gastroesophageal reflux disease)   . Heart murmur   . History of kidney stones    has one now  . Hyperlipidemia   . Hypertension   . Sleep apnea    cpap have not used 3 yrs  Past Surgical History:  Procedure Laterality Date  . ANTERIOR CERVICAL DECOMP/DISCECTOMY FUSION N/A 07/30/2016   Procedure: ANTERIOR CERVICAL DECOMPRESSION/DISCECTOMY FUSION CERVICAL TWO-THREE;  Surgeon: Ashok Pall, MD;  Location: [facility];  Service: Neurosurgery;  Laterality: N/A;  ANTERIOR CERVICAL DECOMPRESSION/DISCECTOMY FUSION CERVICAL 2- CERVICAL 3  . CERVICAL CONE BIOPSY      Social History   Socioeconomic History  . Marital status: Single    Spouse name: Not on file  . Number of children: 0  . Years of education: 84  . Highest education level: Not on file  Occupational History  . Occupation: [employer]  Tobacco Use  . Smoking status: Never Smoker  . Smokeless tobacco: Never Used  Vaping Use  . Vaping Use: Never used  Substance and Sexual Activity   . Alcohol use: No    Alcohol/week: 0.0 standard drinks  . Drug use: No  . Sexual activity: Not on file  Other Topics Concern  . Not on file  Social History Narrative   Lives alone   Caffeine use: Drinks soda (20oz per day)   Tea sometimes   Right-handed   Social Determinants of Health   Financial Resource Strain:   . Difficulty of Paying Living Expenses:   Food Insecurity:   . Worried About Charity fundraiser in the Last Year:   . Arboriculturist in the Last Year:   Transportation Needs:   . Film/video editor (Medical):   Marland Kitchen Lack of Transportation (Non-Medical):   Physical Activity:   . Days of Exercise per Week:   . Minutes of Exercise per Session:   Stress:   . Feeling of Stress :   Social Connections:   . Frequency of Communication with Friends and Family:   . Frequency of Social Gatherings with Friends and Family:   . Attends Religious Services:   . Active Member of Clubs or Organizations:   . Attends Archivist Meetings:   Marland Kitchen Marital Status:   Intimate Partner Violence:   . Fear of Current or Ex-Partner:   . Emotionally Abused:   Marland Kitchen Physically Abused:   . Sexually Abused:     Family History  Problem Relation Age of Onset  . Kidney disease Father   . Thyroid disease Father        Objective: Vitals:   09/07/19 1452  BP: 118/71  Pulse: (!) 105     Physical Exam  Lab Results:  Results for orders placed or performed in visit on 09/07/19 (from the past 24 hour(s))  Urinalysis, Routine w reflex microscopic     Status: Abnormal   Collection Time: 09/07/19  2:43 PM  Result Value Ref Range   Specific Gravity, UA 1.025 1.005 - 1.030   pH, UA 6.0 5.0 - 7.5   Color, UA Yellow Yellow   Appearance Ur Clear Clear   Leukocytes,UA Negative Negative   Protein,UA Negative Negative/Trace   Glucose, UA 2+ (A) Negative   Ketones, UA Negative Negative   RBC, UA Negative Negative   Bilirubin, UA Negative Negative   Urobilinogen, Ur 1.0 0.2 - 1.0  mg/dL   Nitrite, UA Negative Negative   Microscopic Examination Comment    Narrative   Performed at:  [facility] [address] [place], [place]  [id number] Lab Director: Mina Marble MT, Phone:  [phone]    BMET No results for input(s): NA, K, CL, CO2, GLUCOSE, BUN, CREATININE, CALCIUM in the last 72 hours. PSA No results found  for: PSA No results found for: TESTOSTERONE    Studies/Results: KUB reviewed.        Assessment & Plan: Right upper pole renal stone is stable.   She will return in a year.  OAB wet doing well on tolteradine.  Glucosuria.   Related to DM meds.   No orders of the defined types were placed in this encounter.    Orders Placed This Encounter  Procedures  . Urinalysis, Routine w reflex microscopic      Return in about 1 year (around 09/06/2020).   CC: Leeanne Rio, MD      Irine Seal 09/07/2019

## 2019-09-07 ENCOUNTER — Ambulatory Visit (INDEPENDENT_AMBULATORY_CARE_PROVIDER_SITE_OTHER): Payer: BC Managed Care – PPO | Admitting: Urology

## 2019-09-07 ENCOUNTER — Other Ambulatory Visit: Payer: Self-pay

## 2019-09-07 ENCOUNTER — Encounter: Payer: Self-pay | Admitting: Urology

## 2019-09-07 VITALS — BP 118/71 | HR 105

## 2019-09-07 DIAGNOSIS — N200 Calculus of kidney: Secondary | ICD-10-CM | POA: Diagnosis not present

## 2019-09-07 LAB — URINALYSIS, ROUTINE W REFLEX MICROSCOPIC
Bilirubin, UA: NEGATIVE
Ketones, UA: NEGATIVE
Leukocytes,UA: NEGATIVE
Nitrite, UA: NEGATIVE
Protein,UA: NEGATIVE
RBC, UA: NEGATIVE
Specific Gravity, UA: 1.025 (ref 1.005–1.030)
Urobilinogen, Ur: 1 mg/dL (ref 0.2–1.0)
pH, UA: 6 (ref 5.0–7.5)

## 2019-09-07 NOTE — Progress Notes (Signed)
Urological Symptom Review  Patient is experiencing the following symptoms: Get up at night to urinate Leakage of urine   Review of Systems  Gastrointestinal (upper)  : Indigestion/heartburn  Gastrointestinal (lower) : Constipation  Constitutional : Night Sweats Fatigue  Skin: Negative for skin symptoms  Eyes: Blurred vision  Ear/Nose/Throat : Sinus problems  Hematologic/Lymphatic: Negative for Hematologic/Lymphatic symptoms  Cardiovascular : Negative for cardiovascular symptoms  Respiratory : Negative for respiratory symptoms  Endocrine: Negative for endocrine symptoms  Musculoskeletal: Back pain Joint pain  Neurological: Negative for neurological symptoms  Psychologic: Negative for psychiatric symptoms

## 2019-10-11 ENCOUNTER — Other Ambulatory Visit: Payer: Self-pay | Admitting: "Endocrinology

## 2019-10-16 ENCOUNTER — Ambulatory Visit: Payer: BC Managed Care – PPO | Admitting: "Endocrinology

## 2019-10-18 ENCOUNTER — Ambulatory Visit (INDEPENDENT_AMBULATORY_CARE_PROVIDER_SITE_OTHER): Payer: BC Managed Care – PPO | Admitting: "Endocrinology

## 2019-10-18 ENCOUNTER — Encounter: Payer: Self-pay | Admitting: "Endocrinology

## 2019-10-18 ENCOUNTER — Other Ambulatory Visit: Payer: Self-pay

## 2019-10-18 VITALS — BP 116/58 | HR 76 | Ht 64.0 in | Wt 201.4 lb

## 2019-10-18 DIAGNOSIS — I10 Essential (primary) hypertension: Secondary | ICD-10-CM

## 2019-10-18 DIAGNOSIS — E782 Mixed hyperlipidemia: Secondary | ICD-10-CM | POA: Diagnosis not present

## 2019-10-18 DIAGNOSIS — E1165 Type 2 diabetes mellitus with hyperglycemia: Secondary | ICD-10-CM

## 2019-10-18 LAB — POCT GLYCOSYLATED HEMOGLOBIN (HGB A1C): Hemoglobin A1C: 9.4 % — AB (ref 4.0–5.6)

## 2019-10-18 MED ORDER — TRESIBA FLEXTOUCH 200 UNIT/ML ~~LOC~~ SOPN
100.0000 [IU] | PEN_INJECTOR | Freq: Every day | SUBCUTANEOUS | 2 refills | Status: DC
Start: 2019-10-18 — End: 2019-12-27

## 2019-10-18 NOTE — Patient Instructions (Signed)

## 2019-10-18 NOTE — Progress Notes (Signed)
10/18/2019  Endocrinology follow-up note   Subjective:    Patient ID: Rebecca Roberson, female    DOB: [birth year],    Past Medical History:  Diagnosis Date  . Arthritis   . Cervical myelopathy ([place]) 06/30/2016  . Diabetes mellitus without complication ([place])   . GERD (gastroesophageal reflux disease)   . Heart murmur   . History of kidney stones    has one now  . Hyperlipidemia   . Hypertension   . Sleep apnea    cpap have not used 3 yrs   Past Surgical History:  Procedure Laterality Date  . ANTERIOR CERVICAL DECOMP/DISCECTOMY FUSION N/A 07/30/2016   Procedure: ANTERIOR CERVICAL DECOMPRESSION/DISCECTOMY FUSION CERVICAL TWO-THREE;  Surgeon: Ashok Pall, MD;  Location: [facility];  Service: Neurosurgery;  Laterality: N/A;  ANTERIOR CERVICAL DECOMPRESSION/DISCECTOMY FUSION CERVICAL 2- CERVICAL 3  . CERVICAL CONE BIOPSY     Social History   Socioeconomic History  . Marital status: Single    Spouse name: Not on file  . Number of children: 0  . Years of education: 23  . Highest education level: Not on file  Occupational History  . Occupation: [employer]  Tobacco Use  . Smoking status: Never Smoker  . Smokeless tobacco: Never Used  Vaping Use  . Vaping Use: Never used  Substance and Sexual Activity  . Alcohol use: No    Alcohol/week: 0.0 standard drinks  . Drug use: No  . Sexual activity: Not on file  Other Topics Concern  . Not on file  Social History Narrative   Lives alone   Caffeine use: Drinks soda (20oz per day)   Tea sometimes   Right-handed   Social Determinants of Health   Financial Resource Strain:   . Difficulty of Paying Living Expenses: Not on file  Food Insecurity:   . Worried About Charity fundraiser in the Last Year: Not on file  . Ran Out of Food in the Last Year: Not on file  Transportation Needs:   . Lack of Transportation (Medical): Not on file  . Lack of Transportation (Non-Medical): Not on file  Physical Activity:   . Days of  Exercise per Week: Not on file  . Minutes of Exercise per Session: Not on file  Stress:   . Feeling of Stress : Not on file  Social Connections:   . Frequency of Communication with Friends and Family: Not on file  . Frequency of Social Gatherings with Friends and Family: Not on file  . Attends Religious Services: Not on file  . Active Member of Clubs or Organizations: Not on file  . Attends Archivist Meetings: Not on file  . Marital Status: Not on file   Outpatient Encounter Medications as of 10/18/2019  Medication Sig  . amLODipine (NORVASC) 5 MG tablet Take by mouth daily.  Marland Kitchen aspirin EC 81 MG tablet Take 81 mg by mouth daily.  . cyclobenzaprine (FLEXERIL) 10 MG tablet Take 10 mg by mouth at bedtime as needed for muscle spasms.   . fluticasone (FLONASE) 50 MCG/ACT nasal spray Place 2 sprays into both nostrils daily.  Marland Kitchen gabapentin (NEURONTIN) 300 MG capsule Take 300 mg by mouth as needed.   Marland Kitchen glipiZIDE (GLUCOTROL XL) 5 MG 24 hr tablet TAKE ONE TABLET BY MOUTH DAILY WITH BREAKFAST  . glucose blood (ONETOUCH VERIO) test strip USE AS DIRECTED FOUR TIMES DAILY  . ibuprofen (ADVIL,MOTRIN) 800 MG tablet Take 800 mg by mouth 3 (three) times daily as needed for  moderate pain.   Marland Kitchen insulin degludec (TRESIBA FLEXTOUCH) 200 UNIT/ML FlexTouch Pen Inject 100 Units into the skin at bedtime.  . Insulin Pen Needle (ULTICARE SHORT PEN NEEDLES) 31G X 8 MM MISC USE AS DIRECTED DAILY.  Marland Kitchen JANUVIA 100 MG tablet Take 1 tablet by mouth once daily  . LINZESS 72 MCG capsule Take 72 mcg by mouth daily.  Marland Kitchen lisinopril-hydrochlorothiazide (PRINZIDE,ZESTORETIC) 20-25 MG per tablet Take 1 tablet by mouth daily.  Marland Kitchen omeprazole (PRILOSEC) 20 MG capsule Take 20 mg by mouth daily.  [name] VERIO test strip USE TO TEST BLOOD SUGAR FOUR TIMES DAILY  . PARoxetine (PAXIL) 20 MG tablet Take 20 mg by mouth daily.  . potassium chloride (K-DUR,KLOR-CON) 10 MEQ tablet Take 10 mEq by mouth daily.  . rosuvastatin  (CRESTOR) 10 MG tablet TAKE ONE TABLET BY MOUTH EVERY DAY  . tolterodine (DETROL LA) 4 MG 24 hr capsule Take 4 mg by mouth daily.  . traMADol (ULTRAM) 50 MG tablet Take 2 tablets (100 mg total) by mouth every 6 (six) hours as needed for moderate pain.  . Vitamin D, Ergocalciferol, (DRISDOL) 50000 UNITS CAPS capsule Take 50,000 Units by mouth every Monday.   . [DISCONTINUED] TRESIBA FLEXTOUCH 200 UNIT/ML FlexTouch Pen INJECT 90 UNITS SUBCUTANEOUSLY AT BEDTIME   No facility-administered encounter medications on file as of 10/18/2019.   ALLERGIES: Allergies  Allergen Reactions  . Metformin Diarrhea  . Statins Other (See Comments)    Weak all over , muscle weakness   . Sulfa Antibiotics Rash and Other (See Comments)    Gets a fever   . Sulfasalazine Other (See Comments) and Rash    Gets a fever    VACCINATION STATUS:  There is no immunization history on file for this patient.  Diabetes She presents for her follow-up diabetic visit. She has type 2 diabetes mellitus. Onset time: Diagnosed at approximate age of 55 yrs. Her disease course has been worsening. There are no hypoglycemic associated symptoms. Pertinent negatives for hypoglycemia include no confusion, headaches, pallor or seizures. There are no diabetic associated symptoms. Pertinent negatives for diabetes include no chest pain and no polyuria. Symptoms are worsening. There are no diabetic complications. Risk factors for coronary artery disease include dyslipidemia, hypertension and sedentary lifestyle. Current diabetic treatment includes insulin injections and oral agent (monotherapy). Her weight is fluctuating minimally. She is following a generally unhealthy diet. Diabetic meal planning: Admits to dietary indiscretion. She drinks soda regularly. She rarely participates in exercise. (She did not bring any logs nor meter to review.  Her point-of-care A1c is 9.4% increasing from 8.2% during her last visit.)  Hypertension This is a chronic  problem. The current episode started more than 1 year ago. Pertinent negatives include no chest pain, headaches, palpitations or shortness of breath. Risk factors for coronary artery disease include family history, dyslipidemia, sedentary lifestyle, diabetes mellitus and post-menopausal state. Past treatments include ACE inhibitors.  Hyperlipidemia This is a chronic problem. The current episode started more than 1 year ago. Exacerbating diseases include diabetes and obesity. Pertinent negatives include no chest pain, myalgias or shortness of breath. Current antihyperlipidemic treatment includes statins. Risk factors for coronary artery disease include diabetes mellitus, dyslipidemia, hypertension, obesity, post-menopausal, a sedentary lifestyle and family history.      Review of systems  Constitutional: + Minimally fluctuating body weight,  current  Body mass index is 34.57 kg/m. , no fatigue, no subjective hyperthermia, no subjective hypothermia Eyes: no blurry vision, no xerophthalmia ENT: no sore throat,  no nodules palpated in throat, no dysphagia/odynophagia, no hoarseness Cardiovascular: no Chest Pain, no Shortness of Breath, no palpitations, no leg swelling Respiratory: no cough, no shortness of breath Gastrointestinal: no Nausea/Vomiting/Diarhhea Musculoskeletal: no muscle/joint aches Skin: no rashes, no hyperemia Neurological: no tremors, no numbness, no tingling, no dizziness Psychiatric: no depression, no anxiety    Objective:    BP (!) 116/58   Pulse 76   Ht 5\' 4"  (1.626 m)   Wt 201 lb 6.4 oz (91.4 kg)   BMI 34.57 kg/m   Wt Readings from Last 3 Encounters:  10/18/19 201 lb 6.4 oz (91.4 kg)  07/11/19 200 lb 6.4 oz (90.9 kg)  03/12/19 201 lb (91.2 kg)       Physical Exam- Limited  Constitutional:  Body mass index is 34.57 kg/m. , not in acute distress, normal state of mind Eyes:  EOMI, no exophthalmos Neck: Supple Thyroid: No gross goiter Respiratory: Adequate  breathing efforts Musculoskeletal: no gross deformities, strength intact in all four extremities, no gross restriction of joint movements Skin:  no rashes, no hyperemia Neurological: no tremor with outstretched hands   Results for orders placed or performed in visit on 10/18/19  HgB A1c  Result Value Ref Range   Hemoglobin A1C 9.4 (A) 4.0 - 5.6 %   HbA1c POC (<> result, manual entry)     HbA1c, POC (prediabetic range)     HbA1c, POC (controlled diabetic range)     Complete Blood Count (Most recent): Lab Results  Component Value Date   WBC 6.9 07/26/2016   HGB 12.7 07/26/2016   HCT 39.4 07/26/2016   MCV 85.5 07/26/2016   PLT 240 07/26/2016   Chemistry (most recent): Lab Results  Component Value Date   NA 139 06/29/2019   K 3.7 06/29/2019   CL 101 06/29/2019   CO2 32 06/29/2019   BUN 13 06/29/2019   CREATININE 0.66 06/29/2019   Diabetic Labs (most recent): Lab Results  Component Value Date   HGBA1C 9.4 (A) 10/18/2019   HGBA1C 8.2 (A) 07/11/2019   HGBA1C 7.7 (A) 03/12/2019   Lipid Panel     Component Value Date/Time   CHOL 204 (H) 06/29/2019 0819   TRIG 90 06/29/2019 0819   HDL 42 (L) 06/29/2019 0819   CHOLHDL 4.9 06/29/2019 0819   VLDL 36 (H) 08/04/2016 1157   LDLCALC 142 (H) 06/29/2019 0819     Assessment & Plan:   1. Uncontrolled type 2 diabetes mellitus without complication, with long-term current use of insulin ([place])  She did not bring any logs nor meter to review.  Her point-of-care A1c is 9.4% increasing from 8.2% during her last visit.   Recent labs reviewed. Patient remains at a high risk for more acute and chronic complications of diabetes which include CAD, CVA, CKD, retinopathy, and neuropathy. These are all discussed in detail with the patient. I have re-counseled the patient on diet management and weight loss, by adopting a carbohydrate restricted / protein rich  Diet.  - she  admits there is a room for improvement in her diet and drink  choices. -  Suggestion is made for her to avoid simple carbohydrates  from her diet including Cakes, Sweet Desserts / Pastries, Ice Cream, Soda (diet and regular), Sweet Tea, Candies, Chips, Cookies, Sweet Pastries,  Store Bought Juices, Alcohol in Excess of  1-2 drinks a day, Artificial Sweeteners, Coffee Creamer, and "Sugar-free" Products. This will help patient to have stable blood glucose profile and potentially avoid unintended weight  gain.   I have approached patient to continue on  intensive monitoring of blood glucose and insulin therapy, and patient agrees.  -To address her prevailing glycemic burden, she is advised to increase her Tresiba to 100 units nightly,  associated with strict monitoring of blood glucose twice a day-daily before breakfast and at bedtime.   - Patient is warned not to take insulin without proper monitoring per orders. -She will be considered for prandial insulin if she is found to have significant postprandial hyperglycemia.  -Patient is encouraged to call clinic for blood glucose levels less than 70 or above 200 mg /dl. -She is known to have intolerance to metformin.   -She is advised to continue Januvia 100 mg daily at breakfast, as well as glipizide 5 mg XL p.o. daily at breakfast.      2) HTN: Her blood pressure is controlled to target. -She is advised to continue her amlodipine 5 mg p.o. daily, her lisinopril/HCTZ this morning.    3) HPL: Her recent lipid panel showed improved LDL of 118 from 188.  -She is advised to continue Crestor 10 mg p.o. nightly.    Side effects and precautions discussed with him.     4.  Weight management: Her BMI is 34.5 .  She is a candidate for modest weight loss.  I discussed the fact that-10% weight loss will help significantly impact on her diabetes care.  5)  Chronic Care: -Patient is on ACEI and Statin medications and encouraged to continue to follow up with Ophthalmology, Podiatrist at least yearly or according to  recommendations, and advised to stay away from smoking. I have recommended yearly flu vaccine and pneumonia vaccination at least every 5 years; moderate intensity exercise for up to 150 minutes weekly; and  sleep for at least 7 hours a day.  She is advised to maintain close follow-up with her PMD Catalina Antigua, MD  - Time spent on this patient care encounter:  35 min, of which > 50% was spent in  counseling and the rest reviewing her blood glucose logs , discussing her hypoglycemia and hyperglycemia episodes, reviewing her current and  previous labs / studies  ( including abstraction from other facilities) and medications  doses and developing a  long term treatment plan and documenting her care.   Please refer to Patient Instructions for Blood Glucose Monitoring and Insulin/Medications Dosing Guide"  in media tab for additional information. Please  also refer to " Patient Self Inventory" in the Media  tab for reviewed elements of pertinent patient history.  Rebecca Roberson participated in the discussions, expressed understanding, and voiced agreement with the above plans.  All questions were answered to her satisfaction. she is encouraged to contact clinic should she have any questions or concerns prior to her return visit.   Follow up plan: Return in about 10 days (around 10/28/2019), or Phone, for F/U with Meter and Logs Only - no Labs.  Glade Lloyd, MD Phone: [phone]  Fax: [phone]  -  This note was partially dictated with voice recognition software. Similar sounding words can be transcribed inadequately or may not  be corrected upon review.  10/18/2019, 4:52 PM

## 2019-10-23 ENCOUNTER — Other Ambulatory Visit: Payer: Self-pay | Admitting: "Endocrinology

## 2019-10-29 ENCOUNTER — Encounter: Payer: Self-pay | Admitting: "Endocrinology

## 2019-10-29 ENCOUNTER — Telehealth (INDEPENDENT_AMBULATORY_CARE_PROVIDER_SITE_OTHER): Payer: BC Managed Care – PPO | Admitting: "Endocrinology

## 2019-10-29 DIAGNOSIS — E782 Mixed hyperlipidemia: Secondary | ICD-10-CM

## 2019-10-29 DIAGNOSIS — I10 Essential (primary) hypertension: Secondary | ICD-10-CM | POA: Diagnosis not present

## 2019-10-29 DIAGNOSIS — E1165 Type 2 diabetes mellitus with hyperglycemia: Secondary | ICD-10-CM | POA: Diagnosis not present

## 2019-10-29 NOTE — Patient Instructions (Signed)

## 2019-10-29 NOTE — Progress Notes (Signed)
10/29/2019                                                     Endocrinology Telehealth Visit Follow up Note -During COVID -19 Pandemic  This visit type was conducted  via telephone due to national recommendations for restrictions regarding the COVID-19 Pandemic  in an effort to limit this patient's exposure and mitigate transmission of the corona virus.  Due to her co-morbid illnesses, [name] is at  moderate to high risk for complications without adequate follow up.  This format is felt to be most appropriate for her at this time.  I connected with this patient on 10/29/2019   by telephone and verified that I am speaking with the correct person using two identifiers. Rebecca Roberson, 1950/03/24. she has verbally consented to this visit. I was in my office and patient was in her residence. No other persons were with me during the encounter. All issues noted in this document were discussed and addressed. The format was not optimal for physical exam.     Subjective:    Patient ID: Rebecca Roberson, female    DOB: 01/25/1951,    Past Medical History:  Diagnosis Date  . Arthritis   . Cervical myelopathy ([place]) 06/30/2016  . Diabetes mellitus without complication ([place])   . GERD (gastroesophageal reflux disease)   . Heart murmur   . History of kidney stones    has one now  . Hyperlipidemia   . Hypertension   . Sleep apnea    cpap have not used 3 yrs   Past Surgical History:  Procedure Laterality Date  . ANTERIOR CERVICAL DECOMP/DISCECTOMY FUSION N/A 07/30/2016   Procedure: ANTERIOR CERVICAL DECOMPRESSION/DISCECTOMY FUSION CERVICAL TWO-THREE;  Surgeon: Ashok Pall, MD;  Location: [facility];  Service: Neurosurgery;  Laterality: N/A;  ANTERIOR CERVICAL DECOMPRESSION/DISCECTOMY FUSION CERVICAL 2- CERVICAL 3  . CERVICAL CONE BIOPSY     Social History   Socioeconomic History  . Marital status: Single    Spouse name: Not on file  . Number of children: 0  . Years of education: 58  .  Highest education level: Not on file  Occupational History  . Occupation: [employer]  Tobacco Use  . Smoking status: Never Smoker  . Smokeless tobacco: Never Used  Vaping Use  . Vaping Use: Never used  Substance and Sexual Activity  . Alcohol use: No    Alcohol/week: 0.0 standard drinks  . Drug use: No  . Sexual activity: Not on file  Other Topics Concern  . Not on file  Social History Narrative   Lives alone   Caffeine use: Drinks soda (20oz per day)   Tea sometimes   Right-handed   Social Determinants of Health   Financial Resource Strain:   . Difficulty of Paying Living Expenses: Not on file  Food Insecurity:   . Worried About Charity fundraiser in the Last Year: Not on file  . Ran Out of Food in the Last Year: Not on file  Transportation Needs:   . Lack of Transportation (Medical): Not on file  . Lack of Transportation (Non-Medical): Not on file  Physical Activity:   . Days of Exercise per Week: Not on file  . Minutes of Exercise per Session: Not on file  Stress:   . Feeling of Stress :  Not on file  Social Connections:   . Frequency of Communication with Friends and Family: Not on file  . Frequency of Social Gatherings with Friends and Family: Not on file  . Attends Religious Services: Not on file  . Active Member of Clubs or Organizations: Not on file  . Attends Archivist Meetings: Not on file  . Marital Status: Not on file   Outpatient Encounter Medications as of 10/29/2019  Medication Sig  . amLODipine (NORVASC) 5 MG tablet Take by mouth daily.  Marland Kitchen aspirin EC 81 MG tablet Take 81 mg by mouth daily.  . cyclobenzaprine (FLEXERIL) 10 MG tablet Take 10 mg by mouth at bedtime as needed for muscle spasms.   . fluticasone (FLONASE) 50 MCG/ACT nasal spray Place 2 sprays into both nostrils daily.  Marland Kitchen gabapentin (NEURONTIN) 300 MG capsule Take 300 mg by mouth as needed.   Marland Kitchen glipiZIDE (GLUCOTROL XL) 5 MG 24 hr tablet TAKE ONE TABLET BY MOUTH DAILY WITH  BREAKFAST  . glucose blood (ONETOUCH VERIO) test strip USE AS DIRECTED FOUR TIMES DAILY  . ibuprofen (ADVIL,MOTRIN) 800 MG tablet Take 800 mg by mouth 3 (three) times daily as needed for moderate pain.   Marland Kitchen insulin degludec (TRESIBA FLEXTOUCH) 200 UNIT/ML FlexTouch Pen Inject 100 Units into the skin at bedtime.  . Insulin Pen Needle (ULTICARE SHORT PEN NEEDLES) 31G X 8 MM MISC USE AS DIRECTED DAILY.  Marland Kitchen JANUVIA 100 MG tablet Take 1 tablet by mouth once daily  . LINZESS 72 MCG capsule Take 72 mcg by mouth daily.  Marland Kitchen lisinopril-hydrochlorothiazide (PRINZIDE,ZESTORETIC) 20-25 MG per tablet Take 1 tablet by mouth daily.  Marland Kitchen omeprazole (PRILOSEC) 20 MG capsule Take 20 mg by mouth daily.  [name] VERIO test strip USE TO TEST BLOOD SUGAR FOUR TIMES DAILY  . PARoxetine (PAXIL) 20 MG tablet Take 20 mg by mouth daily.  . potassium chloride (K-DUR,KLOR-CON) 10 MEQ tablet Take 10 mEq by mouth daily.  . rosuvastatin (CRESTOR) 10 MG tablet TAKE ONE TABLET BY MOUTH EVERY DAY  . tolterodine (DETROL LA) 4 MG 24 hr capsule Take 4 mg by mouth daily.  . traMADol (ULTRAM) 50 MG tablet Take 2 tablets (100 mg total) by mouth every 6 (six) hours as needed for moderate pain.  . Vitamin D, Ergocalciferol, (DRISDOL) 50000 UNITS CAPS capsule Take 50,000 Units by mouth every Monday.    No facility-administered encounter medications on file as of 10/29/2019.   ALLERGIES: Allergies  Allergen Reactions  . Metformin Diarrhea  . Statins Other (See Comments)    Weak all over , muscle weakness   . Sulfa Antibiotics Rash and Other (See Comments)    Gets a fever   . Sulfasalazine Other (See Comments) and Rash    Gets a fever    VACCINATION STATUS:  There is no immunization history on file for this patient.  Diabetes She presents for her follow-up diabetic visit. She has type 2 diabetes mellitus. Onset time: Diagnosed at approximate age of 36 yrs. Her disease course has been improving. There are no hypoglycemic associated  symptoms. Pertinent negatives for hypoglycemia include no confusion, headaches, pallor or seizures. There are no diabetic associated symptoms. Pertinent negatives for diabetes include no chest pain and no polyuria. Symptoms are improving. There are no diabetic complications. Risk factors for coronary artery disease include dyslipidemia, hypertension and sedentary lifestyle. Current diabetic treatment includes insulin injections and oral agent (monotherapy). Her weight is fluctuating minimally. She is following a generally unhealthy diet.  Diabetic meal planning: Admits to dietary indiscretion. She drinks soda regularly. She rarely participates in exercise. Her home blood glucose trend is decreasing steadily. (She reports significantly improved glycemic profile between 99-124 at fasting, 135-188 at bedtime.  She denies hypoglycemia.  Her recent point-of-care A1c was 9.4%, increasing from 8.2%.    )  Hypertension This is a chronic problem. The current episode started more than 1 year ago. Pertinent negatives include no chest pain, headaches, palpitations or shortness of breath. Risk factors for coronary artery disease include family history, dyslipidemia, sedentary lifestyle, diabetes mellitus and post-menopausal state. Past treatments include ACE inhibitors.  Hyperlipidemia This is a chronic problem. The current episode started more than 1 year ago. Exacerbating diseases include diabetes and obesity. Pertinent negatives include no chest pain, myalgias or shortness of breath. Current antihyperlipidemic treatment includes statins. Risk factors for coronary artery disease include diabetes mellitus, dyslipidemia, hypertension, obesity, post-menopausal, a sedentary lifestyle and family history.      Review of systems  Constitutional: + Minimally fluctuating body weight,  current  There is no height or weight on file to calculate BMI. , no fatigue, no subjective hyperthermia, no subjective hypothermia Eyes: no  blurry vision, no xerophthalmia ENT: no sore throat, no nodules palpated in throat, no dysphagia/odynophagia, no hoarseness Cardiovascular: no Chest Pain, no Shortness of Breath, no palpitations, no leg swelling Respiratory: no cough, no shortness of breath Gastrointestinal: no Nausea/Vomiting/Diarhhea Musculoskeletal: no muscle/joint aches Skin: no rashes, no hyperemia Neurological: no tremors, no numbness, no tingling, no dizziness Psychiatric: no depression, no anxiety    Objective:    There were no vitals taken for this visit.  Wt Readings from Last 3 Encounters:  10/18/19 201 lb 6.4 oz (91.4 kg)  07/11/19 200 lb 6.4 oz (90.9 kg)  03/12/19 201 lb (91.2 kg)        Results for orders placed or performed in visit on 10/18/19  HgB A1c  Result Value Ref Range   Hemoglobin A1C 9.4 (A) 4.0 - 5.6 %   HbA1c POC (<> result, manual entry)     HbA1c, POC (prediabetic range)     HbA1c, POC (controlled diabetic range)     Complete Blood Count (Most recent): Lab Results  Component Value Date   WBC 6.9 07/26/2016   HGB 12.7 07/26/2016   HCT 39.4 07/26/2016   MCV 85.5 07/26/2016   PLT 240 07/26/2016   Chemistry (most recent): Lab Results  Component Value Date   NA 139 06/29/2019   K 3.7 06/29/2019   CL 101 06/29/2019   CO2 32 06/29/2019   BUN 13 06/29/2019   CREATININE 0.66 06/29/2019   Diabetic Labs (most recent): Lab Results  Component Value Date   HGBA1C 9.4 (A) 10/18/2019   HGBA1C 8.2 (A) 07/11/2019   HGBA1C 7.7 (A) 03/12/2019   Lipid Panel     Component Value Date/Time   CHOL 204 (H) 06/29/2019 0819   TRIG 90 06/29/2019 0819   HDL 42 (L) 06/29/2019 0819   CHOLHDL 4.9 06/29/2019 0819   VLDL 36 (H) 08/04/2016 1157   LDLCALC 142 (H) 06/29/2019 0819     Assessment & Plan:   1. Uncontrolled type 2 diabetes mellitus without complication, with long-term current use of insulin ([place])   She reports significantly improved glycemic profile between 99-124 at  fasting, 135-188 at bedtime.  She denies hypoglycemia.  Her recent point-of-care A1c was 9.4%, increasing from 8.2%.   Recent labs reviewed. Patient remains at a high risk for more acute  and chronic complications of diabetes which include CAD, CVA, CKD, retinopathy, and neuropathy. These are all discussed in detail with the patient. I have re-counseled the patient on diet management and weight loss, by adopting a carbohydrate restricted / protein rich  Diet.  - she  admits there is a room for improvement in her diet and drink choices. -  Suggestion is made for her to avoid simple carbohydrates  from her diet including Cakes, Sweet Desserts / Pastries, Ice Cream, Soda (diet and regular), Sweet Tea, Candies, Chips, Cookies, Sweet Pastries,  Store Bought Juices, Alcohol in Excess of  1-2 drinks a day, Artificial Sweeteners, Coffee Creamer, and "Sugar-free" Products. This will help patient to have stable blood glucose profile and potentially avoid unintended weight gain.  I have approached patient to continue on  intensive monitoring of blood glucose and insulin therapy, and patient agrees.  -In light of her near target glycemic profile both fasting and postprandial, she will not need mealtime insulin for now.    -She is advised to continue Tresiba 100 units nightly,   associated with strict monitoring of blood glucose twice a day-daily before breakfast and at bedtime.   - Patient is warned not to take insulin without proper monitoring per orders.  -Patient is encouraged to call clinic for blood glucose levels less than 70 or above 200 mg /dl. -She is known to have intolerance to metformin.   -She is advised to continue Januvia 100 mg daily at breakfast, as well as glipizide 5 mg XL p.o. daily at breakfast.      2) HTN: she is advised to home monitor blood pressure and report if > 140/90 on 2 separate readings.  -She is advised to continue her amlodipine 5 mg p.o. daily, her lisinopril/HCTZ this  morning.    3) HPL: Her recent lipid panel showed improved LDL of 118 from 188.  -She is advised to continue Crestor 10 mg p.o. nightly.    Side effects and precautions discussed with him.     4.  Weight management: Her BMI is 34.5 .  She is a candidate for modest weight loss.  I discussed the fact that-10% weight loss will help significantly impact on her diabetes care.  5)  Chronic Care: -Patient is on ACEI and Statin medications and encouraged to continue to follow up with Ophthalmology, Podiatrist at least yearly or according to recommendations, and advised to stay away from smoking. I have recommended yearly flu vaccine and pneumonia vaccination at least every 5 years; moderate intensity exercise for up to 150 minutes weekly; and  sleep for at least 7 hours a day.  She is advised to maintain close follow-up with her PMD Catalina Antigua, MD  - Time spent on this patient care encounter:  30 min, of which > 50% was spent in  counseling and the rest reviewing her blood glucose logs , discussing her hypoglycemia and hyperglycemia episodes, reviewing her current and  previous labs / studies  ( including abstraction from other facilities) and medications  doses and developing a  long term treatment plan and documenting her care.   Please refer to Patient Instructions for Blood Glucose Monitoring and Insulin/Medications Dosing Guide"  in media tab for additional information. Please  also refer to " Patient Self Inventory" in the Media  tab for reviewed elements of pertinent patient history.  Rebecca Roberson participated in the discussions, expressed understanding, and voiced agreement with the above plans.  All questions were answered to her  satisfaction. she is encouraged to contact clinic should she have any questions or concerns prior to her return visit.    Follow up plan: Return in about 4 months (around 02/29/2020) for F/U with Pre-visit Labs, Meter, Logs, A1c here.Glade Lloyd, MD Phone:  [phone]  Fax: [phone]  -  This note was partially dictated with voice recognition software. Similar sounding words can be transcribed inadequately or may not  be corrected upon review.  10/29/2019, 3:38 PM

## 2019-12-26 ENCOUNTER — Other Ambulatory Visit: Payer: Self-pay | Admitting: "Endocrinology

## 2020-01-21 ENCOUNTER — Other Ambulatory Visit: Payer: Self-pay | Admitting: "Endocrinology

## 2020-02-04 ENCOUNTER — Other Ambulatory Visit: Payer: Self-pay | Admitting: "Endocrinology

## 2020-02-20 ENCOUNTER — Other Ambulatory Visit: Payer: Self-pay | Admitting: "Endocrinology

## 2020-02-20 LAB — COMPREHENSIVE METABOLIC PANEL
ALT: 30 IU/L (ref 0–32)
AST: 37 IU/L (ref 0–40)
Albumin/Globulin Ratio: 1.4 (ref 1.2–2.2)
Albumin: 4 g/dL (ref 3.8–4.8)
Alkaline Phosphatase: 64 IU/L (ref 44–121)
BUN/Creatinine Ratio: 19 (ref 12–28)
BUN: 16 mg/dL (ref 8–27)
Bilirubin Total: <0.2 mg/dL (ref 0.0–1.2)
CO2: 27 mmol/L (ref 20–29)
Calcium: 9.4 mg/dL (ref 8.7–10.3)
Chloride: 100 mmol/L (ref 96–106)
Creatinine, Ser: 0.86 mg/dL (ref 0.57–1.00)
GFR calc Af Amer: 81 mL/min/{1.73_m2} (ref >59)
GFR calc non Af Amer: 70 mL/min/{1.73_m2} (ref >59)
Globulin, Total: 2.9 g/dL (ref 1.5–4.5)
Glucose: 207 mg/dL — ABNORMAL HIGH (ref 65–99)
Potassium: 3.4 mmol/L — ABNORMAL LOW (ref 3.5–5.2)
Sodium: 139 mmol/L (ref 134–144)
Total Protein: 6.9 g/dL (ref 6.0–8.5)

## 2020-02-21 ENCOUNTER — Other Ambulatory Visit: Payer: Self-pay | Admitting: "Endocrinology

## 2020-03-03 ENCOUNTER — Ambulatory Visit: Payer: BC Managed Care – PPO | Admitting: "Endocrinology

## 2020-03-03 ENCOUNTER — Other Ambulatory Visit: Payer: Self-pay

## 2020-03-03 ENCOUNTER — Encounter: Payer: Self-pay | Admitting: "Endocrinology

## 2020-03-03 VITALS — BP 125/66 | HR 76 | Ht 64.0 in | Wt 205.0 lb

## 2020-03-03 DIAGNOSIS — I10 Essential (primary) hypertension: Secondary | ICD-10-CM

## 2020-03-03 DIAGNOSIS — E1165 Type 2 diabetes mellitus with hyperglycemia: Secondary | ICD-10-CM | POA: Diagnosis not present

## 2020-03-03 DIAGNOSIS — E782 Mixed hyperlipidemia: Secondary | ICD-10-CM

## 2020-03-03 LAB — POCT GLYCOSYLATED HEMOGLOBIN (HGB A1C): HbA1c, POC (controlled diabetic range): 8.4 % — AB (ref 0.0–7.0)

## 2020-03-03 MED ORDER — TRESIBA FLEXTOUCH 200 UNIT/ML ~~LOC~~ SOPN: 100.0000 [IU] | PEN_INJECTOR | Freq: Every day | SUBCUTANEOUS | 1 refills | Status: DC

## 2020-03-03 MED ORDER — SITAGLIPTIN PHOSPHATE 100 MG PO TABS: 100.0000 mg | ORAL_TABLET | Freq: Every day | ORAL | 1 refills | Status: DC

## 2020-03-03 NOTE — Progress Notes (Signed)
03/03/2020   Endocrinology follow-up note    Subjective:    Patient ID: Rebecca Roberson, female    DOB: [birth year],    Past Medical History:  Diagnosis Date  . Arthritis   . Cervical myelopathy ([place]) 06/30/2016  . Diabetes mellitus without complication ([place])   . GERD (gastroesophageal reflux disease)   . Heart murmur   . History of kidney stones    has one now  . Hyperlipidemia   . Hypertension   . Sleep apnea    cpap have not used 3 yrs   Past Surgical History:  Procedure Laterality Date  . ANTERIOR CERVICAL DECOMP/DISCECTOMY FUSION N/A 07/30/2016   Procedure: ANTERIOR CERVICAL DECOMPRESSION/DISCECTOMY FUSION CERVICAL TWO-THREE;  Surgeon: Ashok Pall, MD;  Location: [facility];  Service: Neurosurgery;  Laterality: N/A;  ANTERIOR CERVICAL DECOMPRESSION/DISCECTOMY FUSION CERVICAL 2- CERVICAL 3  . CERVICAL CONE BIOPSY     Social History   Socioeconomic History  . Marital status: Single    Spouse name: Not on file  . Number of children: 0  . Years of education: 49  . Highest education level: Not on file  Occupational History  . Occupation: [employer]  Tobacco Use  . Smoking status: Never Smoker  . Smokeless tobacco: Never Used  Vaping Use  . Vaping Use: Never used  Substance and Sexual Activity  . Alcohol use: No    Alcohol/week: 0.0 standard drinks  . Drug use: No  . Sexual activity: Not on file  Other Topics Concern  . Not on file  Social History Narrative   Lives alone   Caffeine use: Drinks soda (20oz per day)   Tea sometimes   Right-handed   Social Determinants of Health   Financial Resource Strain: Not on file  Food Insecurity: Not on file  Transportation Needs: Not on file  Physical Activity: Not on file  Stress: Not on file  Social Connections: Not on file   Outpatient Encounter Medications as of 03/03/2020  Medication Sig  . amLODipine (NORVASC) 5 MG tablet Take by mouth daily.  Marland Kitchen aspirin EC 81 MG tablet Take 81 mg by mouth daily.  .  cyclobenzaprine (FLEXERIL) 10 MG tablet Take 10 mg by mouth at bedtime as needed for muscle spasms.   Marland Kitchen EASY COMFORT PEN NEEDLES 31G X 8 MM MISC USE AS DIRECTED DAILY.  . fluticasone (FLONASE) 50 MCG/ACT nasal spray Place 2 sprays into both nostrils daily.  Marland Kitchen gabapentin (NEURONTIN) 300 MG capsule Take 300 mg by mouth as needed.   Marland Kitchen glipiZIDE (GLUCOTROL XL) 5 MG 24 hr tablet TAKE ONE TABLET BY MOUTH DAILY WITH BREAKFAST  . glucose blood (ONETOUCH VERIO) test strip USE AS DIRECTED FOUR TIMES DAILY  . glucose blood (ONETOUCH VERIO) test strip Use to test blood glucose two times daily  . ibuprofen (ADVIL,MOTRIN) 800 MG tablet Take 800 mg by mouth 3 (three) times daily as needed for moderate pain.   Marland Kitchen insulin degludec (TRESIBA FLEXTOUCH) 200 UNIT/ML FlexTouch Pen Inject 100 Units into the skin at bedtime.  Marland Kitchen LINZESS 72 MCG capsule Take 72 mcg by mouth daily. (Patient not taking: Reported on 03/03/2020)  . lisinopril-hydrochlorothiazide (PRINZIDE,ZESTORETIC) 20-25 MG per tablet Take 1 tablet by mouth daily.  Marland Kitchen omeprazole (PRILOSEC) 20 MG capsule Take 20 mg by mouth daily.  Marland Kitchen PARoxetine (PAXIL) 20 MG tablet Take 20 mg by mouth daily.  . potassium chloride (K-DUR,KLOR-CON) 10 MEQ tablet Take 10 mEq by mouth daily.  . rosuvastatin (CRESTOR) 10 MG tablet TAKE  ONE TABLET BY MOUTH EVERY DAY  . sitaGLIPtin (JANUVIA) 100 MG tablet Take 1 tablet (100 mg total) by mouth daily.  Marland Kitchen tolterodine (DETROL LA) 4 MG 24 hr capsule Take 4 mg by mouth daily.  . traMADol (ULTRAM) 50 MG tablet Take 2 tablets (100 mg total) by mouth every 6 (six) hours as needed for moderate pain.  . Vitamin D, Ergocalciferol, (DRISDOL) 50000 UNITS CAPS capsule Take 50,000 Units by mouth every Monday.   . [DISCONTINUED] insulin degludec (TRESIBA FLEXTOUCH) 200 UNIT/ML FlexTouch Pen Inject 100 Units into the skin at bedtime.  . [DISCONTINUED] JANUVIA 100 MG tablet Take 1 tablet by mouth once daily   No facility-administered encounter  medications on file as of 03/03/2020.   ALLERGIES: Allergies  Allergen Reactions  . Metformin Diarrhea  . Statins Other (See Comments)    Weak all over , muscle weakness   . Sulfa Antibiotics Rash and Other (See Comments)    Gets a fever   . Sulfasalazine Other (See Comments) and Rash    Gets a fever    VACCINATION STATUS:  There is no immunization history on file for this patient.  Diabetes She presents for her follow-up diabetic visit. She has type 2 diabetes mellitus. Onset time: Diagnosed at approximate age of 53 yrs. Her disease course has been improving. There are no hypoglycemic associated symptoms. Pertinent negatives for hypoglycemia include no confusion, headaches, pallor or seizures. There are no diabetic associated symptoms. Pertinent negatives for diabetes include no chest pain and no polyuria. Symptoms are improving. There are no diabetic complications. Risk factors for coronary artery disease include dyslipidemia, hypertension and sedentary lifestyle. Current diabetic treatment includes insulin injections and oral agent (monotherapy). Her weight is increasing steadily. She is following a generally unhealthy diet. Diabetic meal planning: Admits to dietary indiscretion. She drinks soda regularly. She rarely participates in exercise. Her home blood glucose trend is decreasing steadily. (She did not bring any logs nor meter to review with her.  She reports loss blood glucose of 77.  Her point-of-care A1c is 8.4% improving from 9.4%.  She denies any significant hypoglycemia.    )  Hypertension This is a chronic problem. The current episode started more than 1 year ago. Pertinent negatives include no chest pain, headaches, palpitations or shortness of breath. Risk factors for coronary artery disease include family history, dyslipidemia, sedentary lifestyle, diabetes mellitus and post-menopausal state. Past treatments include ACE inhibitors.  Hyperlipidemia This is a chronic problem.  The current episode started more than 1 year ago. Exacerbating diseases include diabetes and obesity. Pertinent negatives include no chest pain, myalgias or shortness of breath. Current antihyperlipidemic treatment includes statins. Risk factors for coronary artery disease include diabetes mellitus, dyslipidemia, hypertension, obesity, post-menopausal, a sedentary lifestyle and family history.      Review of systems  Constitutional: + Minimally fluctuating body weight,  current  Body mass index is 35.19 kg/m. , no fatigue, no subjective hyperthermia, no subjective hypothermia     Objective:    BP 125/66   Pulse 76   Ht 5\' 4"  (1.626 m)   Wt 205 lb (93 kg)   BMI 35.19 kg/m   Wt Readings from Last 3 Encounters:  03/03/20 205 lb (93 kg)  10/18/19 201 lb 6.4 oz (91.4 kg)  07/11/19 200 lb 6.4 oz (90.9 kg)        Results for orders placed or performed in visit on 03/03/20  HgB A1c  Result Value Ref Range   Hemoglobin A1C  HbA1c POC (<> result, manual entry)     HbA1c, POC (prediabetic range)     HbA1c, POC (controlled diabetic range) 8.4 (A) 0.0 - 7.0 %   Complete Blood Count (Most recent): Lab Results  Component Value Date   WBC 6.9 07/26/2016   HGB 12.7 07/26/2016   HCT 39.4 07/26/2016   MCV 85.5 07/26/2016   PLT 240 07/26/2016   Chemistry (most recent): Lab Results  Component Value Date   NA 139 02/19/2020   K 3.4 (L) 02/19/2020   CL 100 02/19/2020   CO2 27 02/19/2020   BUN 16 02/19/2020   CREATININE 0.86 02/19/2020   Diabetic Labs (most recent): Lab Results  Component Value Date   HGBA1C 8.4 (A) 03/03/2020   HGBA1C 9.4 (A) 10/18/2019   HGBA1C 8.2 (A) 07/11/2019   Lipid Panel     Component Value Date/Time   CHOL 204 (H) 06/29/2019 0819   TRIG 90 06/29/2019 0819   HDL 42 (L) 06/29/2019 0819   CHOLHDL 4.9 06/29/2019 0819   VLDL 36 (H) 08/04/2016 1157   LDLCALC 142 (H) 06/29/2019 0819     Assessment & Plan:   1. Uncontrolled type 2 diabetes  mellitus without complication, with long-term current use of insulin ([place])   She did not bring any logs nor meter to review with her.  She reports loss blood glucose of 77.  Her point-of-care A1c is 8.4% improving from 9.4%.  She denies any significant hypoglycemia.     Recent labs reviewed. Patient remains at a high risk for more acute and chronic complications of diabetes which include CAD, CVA, CKD, retinopathy, and neuropathy. These are all discussed in detail with the patient. I have re-counseled the patient on diet management and weight loss, by adopting a carbohydrate restricted / protein rich  Diet.  - she acknowledges that there is a room for improvement in her food and drink choices. - Suggestion is made for her to avoid simple carbohydrates  from her diet including Cakes, Sweet Desserts, Ice Cream, Soda (diet and regular), Sweet Tea, Candies, Chips, Cookies, Store Bought Juices, Alcohol in Excess of  1-2 drinks a day, Artificial Sweeteners,  Coffee Creamer, and "Sugar-free" Products, Lemonade. This will help patient to have more stable blood glucose profile and potentially avoid unintended weight gain.   I have approached patient to continue on  intensive monitoring of blood glucose and insulin therapy, and patient agrees.   -Her presentation without meter nor logs makes it difficult to optimize her insulin treatment.   -She is approached again to monitor blood glucoses at least twice a day-daily before breakfast and at bedtime.  She is advised to continue Tresiba 100 units nightly.    - Patient is warned not to take insulin without proper monitoring per orders.  -Patient is encouraged to call clinic for blood glucose levels less than 70 or above 200 mg /dl at fasting. -She is known to have intolerance to metformin.   -She is advised to continue Januvia 100 mg daily at breakfast as well as glipizide 5 mg XL p.o. daily at breakfast.    2) HTN: Her blood pressure is controlled to  target.  -She is advised to continue her amlodipine 5 mg p.o. daily, her lisinopril/HCTZ this morning.    3) HPL: Her recent lipid panel showed improved LDL of 118 from 188.  -She is advised to continue Crestor 10 mg p.o. nightly.    Side effects and precautions discussed with him.  4.  Weight management: Her BMI is 35.2  .  She is a candidate for modest weight loss.  I discussed the fact that-10% weight loss will help significantly impact on her diabetes care.  5)  Chronic Care: -Patient is on ACEI and Statin medications and encouraged to continue to follow up with Ophthalmology, Podiatrist at least yearly or according to recommendations, and advised to stay away from smoking. I have recommended yearly flu vaccine and pneumonia vaccination at least every 5 years; moderate intensity exercise for up to 150 minutes weekly; and  sleep for at least 7 hours a day.  POCT ABI Results 03/03/20  Her ABIs normal today Right ABI: 1.19      left ABI: 1.24  Right leg systolic / diastolic: 740/81 mmHg Left leg systolic / diastolic: 448/18 mmHg  Arm systolic / diastolic: 563/14 mmHG This study will be repeated in February 2027, or sooner if needed.  She is advised to maintain close follow-up with her PMD Catalina Antigua, MD  - Time spent on this patient care encounter:  30 min, of which > 50% was spent in  counseling and the rest reviewing her blood glucose logs , discussing her hypoglycemia and hyperglycemia episodes, reviewing her current and  previous labs / studies  ( including abstraction from other facilities) and medications  doses and developing a  long term treatment plan and documenting her care.   Please refer to Patient Instructions for Blood Glucose Monitoring and Insulin/Medications Dosing Guide"  in media tab for additional information. Please  also refer to " Patient Self Inventory" in the Media  tab for reviewed elements of pertinent patient history.  Rebecca Roberson participated in  the discussions, expressed understanding, and voiced agreement with the above plans.  All questions were answered to her satisfaction. she is encouraged to contact clinic should she have any questions or concerns prior to her return visit.    Follow up plan: Return in about 3 months (around 05/31/2020) for Bring Meter and Logs- A1c in Office.  Glade Lloyd, MD Phone: [phone]  Fax: [phone]  -  This note was partially dictated with voice recognition software. Similar sounding words can be transcribed inadequately or may not  be corrected upon review.  03/03/2020, 4:28 PM

## 2020-03-03 NOTE — Patient Instructions (Signed)
                                     Advice for Weight Management  -For most of us the best way to lose weight is by diet management. Generally speaking, diet management means consuming less calories intentionally which over time brings about progressive weight loss.  This can be achieved more effectively by restricting carbohydrate consumption to the minimum possible.  So, it is critically important to know your numbers: how much calorie you are consuming and how much calorie you need. More importantly, our carbohydrates sources should be unprocessed or minimally processed complex starch food items.   Sometimes, it is important to balance nutrition by increasing protein intake (animal or plant source), fruits, and vegetables.  -Sticking to a routine mealtime to eat 3 meals a day and avoiding unnecessary snacks is shown to have a big role in weight control. Under normal circumstances, the only time we lose real weight is when we are hungry, so allow hunger to take place- hunger means no food between meal times, only water.  It is not advisable to starve.   -It is better to avoid simple carbohydrates including: Cakes, Sweet Desserts, Ice Cream, Soda (diet and regular), Sweet Tea, Candies, Chips, Cookies, Store Bought Juices, Alcohol in Excess of  1-2 drinks a day, Lemonade,  Artificial Sweeteners, Doughnuts, Coffee Creamers, "Sugar-free" Products, etc, etc.  This is not a complete list.....    -Consulting with certified diabetes educators is proven to provide you with the most accurate and current information on diet.  Also, you may be  interested in discussing diet options/exchanges , we can schedule a visit with Rebecca Roberson, RDN, CDE for individualized nutrition education.  -Exercise: If you are able: 30 -60 minutes a day ,4 days a week, or 150 minutes a week.  The longer the better.  Combine stretch, strength, and aerobic activities.  If you were told in the  past that you have high risk for cardiovascular diseases, you may seek evaluation by your heart doctor prior to initiating moderate to intense exercise programs.                                  Additional Care Considerations for Diabetes   -Diabetes  is a chronic disease.  The most important care consideration is regular follow-up with your diabetes care provider with the goal being avoiding or delaying its complications and to take advantage of advances in medications and technology.    -Type 2 diabetes is known to coexist with other important comorbidities such as high blood pressure and high cholesterol.  It is critical to control not only the diabetes but also the high blood pressure and high cholesterol to minimize and delay the risk of complications including coronary artery disease, stroke, amputations, blindness, etc.    - Studies showed that people with diabetes will benefit from a class of medications known as ACE inhibitors and statins.  Unless there are specific reasons not to be on these medications, the standard of care is to consider getting one from these groups of medications at an optimal doses.  These medications are generally considered safe and proven to help protect the heart and the kidneys.    - People with diabetes are encouraged to initiate and maintain regular follow-up with eye doctors, foot   doctors, dentists , and if necessary heart and kidney doctors.     - It is highly recommended that people with diabetes quit smoking or stay away from smoking, and get yearly  flu vaccine and pneumonia vaccine at least every 5 years.  One other important lifestyle recommendation is to ensure adequate sleep - at least 6-7 hours of uninterrupted sleep at night.  -Exercise: If you are able: 30 -60 minutes a day, 4 days a week, or 150 minutes a week.  The longer the better.  Combine stretch, strength, and aerobic activities.  If you were told in the past that you have high risk for  cardiovascular diseases, you may seek evaluation by your heart doctor prior to initiating moderate to intense exercise programs.          

## 2020-06-09 ENCOUNTER — Ambulatory Visit: Payer: BC Managed Care – PPO | Admitting: "Endocrinology

## 2020-06-16 ENCOUNTER — Ambulatory Visit (INDEPENDENT_AMBULATORY_CARE_PROVIDER_SITE_OTHER): Payer: BC Managed Care – PPO | Admitting: "Endocrinology

## 2020-06-16 ENCOUNTER — Other Ambulatory Visit: Payer: Self-pay

## 2020-06-16 ENCOUNTER — Encounter: Payer: Self-pay | Admitting: "Endocrinology

## 2020-06-16 VITALS — BP 116/58 | HR 76 | Ht 64.0 in | Wt 202.8 lb

## 2020-06-16 DIAGNOSIS — E1165 Type 2 diabetes mellitus with hyperglycemia: Secondary | ICD-10-CM | POA: Diagnosis not present

## 2020-06-16 DIAGNOSIS — E782 Mixed hyperlipidemia: Secondary | ICD-10-CM

## 2020-06-16 DIAGNOSIS — I10 Essential (primary) hypertension: Secondary | ICD-10-CM

## 2020-06-16 LAB — POCT GLYCOSYLATED HEMOGLOBIN (HGB A1C): HbA1c, POC (controlled diabetic range): 8.2 % — AB (ref 0.0–7.0)

## 2020-06-16 MED ORDER — TRESIBA FLEXTOUCH 200 UNIT/ML ~~LOC~~ SOPN: 110.0000 [IU] | PEN_INJECTOR | Freq: Every day | SUBCUTANEOUS | 1 refills | Status: DC

## 2020-06-16 NOTE — Progress Notes (Signed)
06/16/2020   Endocrinology follow-up note    Subjective:    Patient ID: Rebecca Roberson, female    DOB: 1950-08-03,    Past Medical History:  Diagnosis Date  . Arthritis   . Cervical myelopathy ([place]) 06/30/2016  . Diabetes mellitus without complication ([place])   . GERD (gastroesophageal reflux disease)   . Heart murmur   . History of kidney stones    has one now  . Hyperlipidemia   . Hypertension   . Sleep apnea    cpap have not used 3 yrs   Past Surgical History:  Procedure Laterality Date  . ANTERIOR CERVICAL DECOMP/DISCECTOMY FUSION N/A 07/30/2016   Procedure: ANTERIOR CERVICAL DECOMPRESSION/DISCECTOMY FUSION CERVICAL TWO-THREE;  Surgeon: Ashok Pall, MD;  Location: [facility];  Service: Neurosurgery;  Laterality: N/A;  ANTERIOR CERVICAL DECOMPRESSION/DISCECTOMY FUSION CERVICAL 2- CERVICAL 3  . CERVICAL CONE BIOPSY     Social History   Socioeconomic History  . Marital status: Single    Spouse name: Not on file  . Number of children: 0  . Years of education: 9  . Highest education level: Not on file  Occupational History  . Occupation: [employer]  Tobacco Use  . Smoking status: Never Smoker  . Smokeless tobacco: Never Used  Vaping Use  . Vaping Use: Never used  Substance and Sexual Activity  . Alcohol use: No    Alcohol/week: 0.0 standard drinks  . Drug use: No  . Sexual activity: Not on file  Other Topics Concern  . Not on file  Social History Narrative   Lives alone   Caffeine use: Drinks soda (20oz per day)   Tea sometimes   Right-handed   Social Determinants of Health   Financial Resource Strain: Not on file  Food Insecurity: Not on file  Transportation Needs: Not on file  Physical Activity: Not on file  Stress: Not on file  Social Connections: Not on file   Outpatient Encounter Medications as of 06/16/2020  Medication Sig  . amLODipine (NORVASC) 5 MG tablet Take by mouth daily.  Marland Kitchen aspirin EC 81 MG tablet Take 81 mg by mouth daily.  .  cyclobenzaprine (FLEXERIL) 10 MG tablet Take 10 mg by mouth at bedtime as needed for muscle spasms.   Marland Kitchen EASY COMFORT PEN NEEDLES 31G X 8 MM MISC USE AS DIRECTED DAILY.  . fluticasone (FLONASE) 50 MCG/ACT nasal spray Place 2 sprays into both nostrils daily.  Marland Kitchen gabapentin (NEURONTIN) 300 MG capsule Take 300 mg by mouth as needed.   Marland Kitchen glipiZIDE (GLUCOTROL XL) 5 MG 24 hr tablet TAKE ONE TABLET BY MOUTH DAILY WITH BREAKFAST  . glucose blood (ONETOUCH VERIO) test strip USE AS DIRECTED FOUR TIMES DAILY  . glucose blood (ONETOUCH VERIO) test strip Use to test blood glucose two times daily  . ibuprofen (ADVIL,MOTRIN) 800 MG tablet Take 800 mg by mouth 3 (three) times daily as needed for moderate pain.   Marland Kitchen insulin degludec (TRESIBA FLEXTOUCH) 200 UNIT/ML FlexTouch Pen Inject 110 Units into the skin at bedtime.  Marland Kitchen lisinopril-hydrochlorothiazide (PRINZIDE,ZESTORETIC) 20-25 MG per tablet Take 1 tablet by mouth daily.  Marland Kitchen omeprazole (PRILOSEC) 20 MG capsule Take 20 mg by mouth daily.  Marland Kitchen PARoxetine (PAXIL) 20 MG tablet Take 20 mg by mouth daily.  . potassium chloride (K-DUR,KLOR-CON) 10 MEQ tablet Take 10 mEq by mouth daily.  . rosuvastatin (CRESTOR) 10 MG tablet TAKE ONE TABLET BY MOUTH EVERY DAY  . sitaGLIPtin (JANUVIA) 100 MG tablet Take 1 tablet (100 mg  total) by mouth daily.  Marland Kitchen tolterodine (DETROL LA) 4 MG 24 hr capsule Take 4 mg by mouth daily.  . traMADol (ULTRAM) 50 MG tablet Take 2 tablets (100 mg total) by mouth every 6 (six) hours as needed for moderate pain.  . Vitamin D, Ergocalciferol, (DRISDOL) 50000 UNITS CAPS capsule Take 50,000 Units by mouth every Monday.   . [DISCONTINUED] insulin degludec (TRESIBA FLEXTOUCH) 200 UNIT/ML FlexTouch Pen Inject 100 Units into the skin at bedtime.  . [DISCONTINUED] LINZESS 72 MCG capsule Take 72 mcg by mouth daily. (Patient not taking: Reported on 03/03/2020)   No facility-administered encounter medications on file as of 06/16/2020.   ALLERGIES: Allergies   Allergen Reactions  . Metformin Diarrhea  . Statins Other (See Comments)    Weak all over , muscle weakness   . Sulfa Antibiotics Rash and Other (See Comments)    Gets a fever   . Sulfasalazine Other (See Comments) and Rash    Gets a fever    VACCINATION STATUS:  There is no immunization history on file for this patient.  Diabetes She presents for her follow-up diabetic visit. She has type 2 diabetes mellitus. Onset time: Diagnosed at approximate age of 69 yrs. Her disease course has been improving. There are no hypoglycemic associated symptoms. Pertinent negatives for hypoglycemia include no confusion, headaches, pallor or seizures. There are no diabetic associated symptoms. Pertinent negatives for diabetes include no chest pain and no polyuria. Symptoms are improving. There are no diabetic complications. Risk factors for coronary artery disease include dyslipidemia, hypertension and sedentary lifestyle. Current diabetic treatment includes insulin injections and oral agent (monotherapy). Her weight is fluctuating minimally. She is following a generally unhealthy diet. Diabetic meal planning: Admits to dietary indiscretion. She drinks soda regularly. She rarely participates in exercise. Her home blood glucose trend is decreasing steadily. Her breakfast blood glucose range is generally 140-180 mg/dl. Her bedtime blood glucose range is generally 180-200 mg/dl. Her overall blood glucose range is 180-200 mg/dl. (She presents with her meter and logs showing slightly above target glycemic profile.  Her point-of-care A1c is 8.2%, progressively improving from 9.4%.  She did not document or report hypoglycemia.    )  Hypertension This is a chronic problem. The current episode started more than 1 year ago. Pertinent negatives include no chest pain, headaches, palpitations or shortness of breath. Risk factors for coronary artery disease include family history, dyslipidemia, sedentary lifestyle, diabetes  mellitus and post-menopausal state. Past treatments include ACE inhibitors.  Hyperlipidemia This is a chronic problem. The current episode started more than 1 year ago. Exacerbating diseases include diabetes and obesity. Pertinent negatives include no chest pain, myalgias or shortness of breath. Current antihyperlipidemic treatment includes statins. Risk factors for coronary artery disease include diabetes mellitus, dyslipidemia, hypertension, obesity, post-menopausal, a sedentary lifestyle and family history.      Review of systems  Constitutional: + Minimally fluctuating body weight,  current  Body mass index is 34.81 kg/m. , no fatigue, no subjective hyperthermia, no subjective hypothermia     Objective:    BP (!) 116/58   Pulse 76   Ht 5\' 4"  (1.626 m)   Wt 202 lb 12.8 oz (92 kg)   BMI 34.81 kg/m   Wt Readings from Last 3 Encounters:  06/16/20 202 lb 12.8 oz (92 kg)  03/03/20 205 lb (93 kg)  10/18/19 201 lb 6.4 oz (91.4 kg)        Results for orders placed or performed in visit on 06/16/20  HgB A1c  Result Value Ref Range   Hemoglobin A1C     HbA1c POC (<> result, manual entry)     HbA1c, POC (prediabetic range)     HbA1c, POC (controlled diabetic range) 8.2 (A) 0.0 - 7.0 %   Complete Blood Count (Most recent): Lab Results  Component Value Date   WBC 6.9 07/26/2016   HGB 12.7 07/26/2016   HCT 39.4 07/26/2016   MCV 85.5 07/26/2016   PLT 240 07/26/2016   Chemistry (most recent): Lab Results  Component Value Date   NA 139 02/19/2020   K 3.4 (L) 02/19/2020   CL 100 02/19/2020   CO2 27 02/19/2020   BUN 16 02/19/2020   CREATININE 0.86 02/19/2020   Diabetic Labs (most recent): Lab Results  Component Value Date   HGBA1C 8.2 (A) 06/16/2020   HGBA1C 8.4 (A) 03/03/2020   HGBA1C 9.4 (A) 10/18/2019   Lipid Panel     Component Value Date/Time   CHOL 204 (H) 06/29/2019 0819   TRIG 90 06/29/2019 0819   HDL 42 (L) 06/29/2019 0819   CHOLHDL 4.9 06/29/2019 0819    VLDL 36 (H) 08/04/2016 1157   LDLCALC 142 (H) 06/29/2019 0819     Assessment & Plan:   1. Uncontrolled type 2 diabetes mellitus without complication, with long-term current use of insulin ([place])  She presents with her meter and logs showing slightly above target glycemic profile.  Her point-of-care A1c is 8.2%, progressively improving from 9.4%.  She did not document or report hypoglycemia.     Recent labs reviewed. Patient remains at a high risk for more acute and chronic complications of diabetes which include CAD, CVA, CKD, retinopathy, and neuropathy. These are all discussed in detail with the patient. I have re-counseled the patient on diet management and weight loss, by adopting a carbohydrate restricted / protein rich  Diet.  - she acknowledges that there is a room for improvement in her food and drink choices. - Suggestion is made for her to avoid simple carbohydrates  from her diet including Cakes, Sweet Desserts, Ice Cream, Soda (diet and regular), Sweet Tea, Candies, Chips, Cookies, Store Bought Juices, Alcohol in Excess of  1-2 drinks a day, Artificial Sweeteners,  Coffee Creamer, and "Sugar-free" Products, Lemonade. This will help patient to have more stable blood glucose profile and potentially avoid unintended weight gain.   I have approached patient to continue on  intensive monitoring of blood glucose and insulin therapy, and patient agrees.   -In light of her presentation with slightly above target glycemic profile, she will need a higher dose of insulin in order for her to achieve control of diabetes to target.   -She is advised to increase her Tresiba to 110 units nightly, associated with monitoring of blood glucose at least twice a day-daily before breakfast and at bedtime. - Patient is warned not to take insulin without proper monitoring per orders.  -Patient is encouraged to call clinic for blood glucose levels less than 70 or above 200 mg /dl at fasting. -She is  known to have intolerance to metformin.   -She is advised to continue Januvia 100 mg daily at breakfast as well as glipizide 5 mg XL p.o. daily at breakfast.    2) HTN: -Her blood pressure is controlled to target.  -She is advised to continue her amlodipine 5 mg p.o. daily, her lisinopril/HCTZ this morning.    3) HPL: Her recent lipid panel showed improved LDL of 118 from 188.  -She  is advised to continue Crestor 10 mg p.o. nightly.    Side effects and precautions discussed with him.     4.  Weight management: Her BMI is 34.8 .  She is a candidate for modest weight loss.  I discussed the fact that-10% weight loss will help significantly impact on her diabetes care.  5)  Chronic Care: -Patient is on ACEI and Statin medications and encouraged to continue to follow up with Ophthalmology, Podiatrist at least yearly or according to recommendations, and advised to stay away from smoking. I have recommended yearly flu vaccine and pneumonia vaccination at least every 5 years; moderate intensity exercise for up to 150 minutes weekly; and  sleep for at least 7 hours a day.   Her screening ABI was negative for PAD in February 2022.  This study will be repeated in February 2027, or sooner if needed.    She is advised to maintain close follow-up with her PMD Catalina Antigua, MD    I spent 35 minutes in the care of the patient today including review of labs from [facility], Lipids, Thyroid Function, Hematology (current and previous including abstractions from other facilities); face-to-face time discussing  her blood glucose readings/logs, discussing hypoglycemia and hyperglycemia episodes and symptoms, medications doses, her options of short and long term treatment based on the latest standards of care / guidelines;  discussion about incorporating lifestyle medicine;  and documenting the encounter.    Please refer to Patient Instructions for Blood Glucose Monitoring and Insulin/Medications Dosing Guide"  in  media tab for additional information. Please  also refer to " Patient Self Inventory" in the Media  tab for reviewed elements of pertinent patient history.  Rebecca Roberson participated in the discussions, expressed understanding, and voiced agreement with the above plans.  All questions were answered to her satisfaction. she is encouraged to contact clinic should she have any questions or concerns prior to her return visit.   Follow up plan: Return in about 3 months (around 09/16/2020) for F/U with Pre-visit Labs, Meter, Logs, A1c here.Glade Lloyd, MD Phone: [phone]  Fax: [phone]  -  This note was partially dictated with voice recognition software. Similar sounding words can be transcribed inadequately or may not  be corrected upon review.  06/16/2020, 4:18 PM

## 2020-06-16 NOTE — Patient Instructions (Signed)

## 2020-06-21 ENCOUNTER — Other Ambulatory Visit: Payer: Self-pay | Admitting: "Endocrinology

## 2020-07-07 ENCOUNTER — Other Ambulatory Visit: Payer: Self-pay | Admitting: Urology

## 2020-07-07 DIAGNOSIS — N819 Female genital prolapse, unspecified: Secondary | ICD-10-CM

## 2020-07-07 DIAGNOSIS — N3941 Urge incontinence: Secondary | ICD-10-CM

## 2020-08-09 ENCOUNTER — Other Ambulatory Visit: Payer: Self-pay | Admitting: "Endocrinology

## 2020-09-11 ENCOUNTER — Ambulatory Visit: Payer: BC Managed Care – PPO | Admitting: Urology

## 2020-09-11 ENCOUNTER — Other Ambulatory Visit: Payer: Self-pay | Admitting: "Endocrinology

## 2020-09-12 ENCOUNTER — Ambulatory Visit: Payer: BC Managed Care – PPO | Admitting: Urology

## 2020-09-16 LAB — COMPREHENSIVE METABOLIC PANEL
ALT: 37 IU/L — ABNORMAL HIGH (ref 0–32)
AST: 42 IU/L — ABNORMAL HIGH (ref 0–40)
Albumin/Globulin Ratio: 1.5 (ref 1.2–2.2)
Albumin: 4.2 g/dL (ref 3.8–4.8)
Alkaline Phosphatase: 63 IU/L (ref 44–121)
BUN/Creatinine Ratio: 23 (ref 12–28)
BUN: 15 mg/dL (ref 8–27)
Bilirubin Total: 0.3 mg/dL (ref 0.0–1.2)
CO2: 26 mmol/L (ref 20–29)
Calcium: 9.5 mg/dL (ref 8.7–10.3)
Chloride: 100 mmol/L (ref 96–106)
Creatinine, Ser: 0.66 mg/dL (ref 0.57–1.00)
Globulin, Total: 2.8 g/dL (ref 1.5–4.5)
Glucose: 110 mg/dL — ABNORMAL HIGH (ref 65–99)
Potassium: 3.8 mmol/L (ref 3.5–5.2)
Sodium: 141 mmol/L (ref 134–144)
Total Protein: 7 g/dL (ref 6.0–8.5)
eGFR: 95 mL/min/{1.73_m2} (ref >59)

## 2020-09-16 LAB — LIPID PANEL
Chol/HDL Ratio: 5.6 ratio — ABNORMAL HIGH (ref 0.0–4.4)
Cholesterol, Total: 229 mg/dL — ABNORMAL HIGH (ref 100–199)
HDL: 41 mg/dL (ref >39)
LDL Chol Calc (NIH): 170 mg/dL — ABNORMAL HIGH (ref 0–99)
Triglycerides: 100 mg/dL (ref 0–149)
VLDL Cholesterol Cal: 18 mg/dL (ref 5–40)

## 2020-09-16 LAB — T4, FREE: Free T4: 0.95 ng/dL (ref 0.82–1.77)

## 2020-09-16 LAB — TSH: TSH: 1.33 u[IU]/mL (ref 0.450–4.500)

## 2020-09-18 ENCOUNTER — Ambulatory Visit: Payer: BC Managed Care – PPO | Admitting: "Endocrinology

## 2020-09-24 ENCOUNTER — Ambulatory Visit: Payer: BC Managed Care – PPO | Admitting: "Endocrinology

## 2020-09-24 ENCOUNTER — Encounter: Payer: Self-pay | Admitting: "Endocrinology

## 2020-09-24 ENCOUNTER — Telehealth: Payer: Self-pay | Admitting: "Endocrinology

## 2020-09-24 VITALS — BP 119/72 | HR 91 | Ht 64.0 in | Wt 202.6 lb

## 2020-09-24 DIAGNOSIS — I10 Essential (primary) hypertension: Secondary | ICD-10-CM

## 2020-09-24 DIAGNOSIS — E782 Mixed hyperlipidemia: Secondary | ICD-10-CM | POA: Diagnosis not present

## 2020-09-24 DIAGNOSIS — E1165 Type 2 diabetes mellitus with hyperglycemia: Secondary | ICD-10-CM

## 2020-09-24 MED ORDER — SITAGLIPTIN PHOSPHATE 100 MG PO TABS: 100.0000 mg | ORAL_TABLET | Freq: Every day | ORAL | 1 refills | Status: DC

## 2020-09-24 MED ORDER — ROSUVASTATIN CALCIUM 20 MG PO TABS: 20.0000 mg | ORAL_TABLET | Freq: Every day | ORAL | 1 refills | Status: DC

## 2020-09-24 NOTE — Progress Notes (Signed)
09/24/2020   Endocrinology follow-up note    Subjective:    Patient ID: Rebecca Roberson, female    DOB: [birth year],    Past Medical History:  Diagnosis Date   Arthritis    Cervical myelopathy ([place]) 06/30/2016   Diabetes mellitus without complication (HCC)    GERD (gastroesophageal reflux disease)    Heart murmur    History of kidney stones    has one now   Hyperlipidemia    Hypertension    Sleep apnea    cpap have not used 3 yrs   Past Surgical History:  Procedure Laterality Date   ANTERIOR CERVICAL DECOMP/DISCECTOMY FUSION N/A 07/30/2016   Procedure: ANTERIOR CERVICAL DECOMPRESSION/DISCECTOMY FUSION CERVICAL TWO-THREE;  Surgeon: Ashok Pall, MD;  Location: [facility];  Service: Neurosurgery;  Laterality: N/A;  ANTERIOR CERVICAL DECOMPRESSION/DISCECTOMY FUSION CERVICAL 2- CERVICAL 3   CERVICAL CONE BIOPSY     Social History   Socioeconomic History   Marital status: Single    Spouse name: Not on file   Number of children: 0   Years of education: 14   Highest education level: Not on file  Occupational History   Occupation: [employer]  Tobacco Use   Smoking status: Never   Smokeless tobacco: Never  Vaping Use   Vaping Use: Never used  Substance and Sexual Activity   Alcohol use: No    Alcohol/week: 0.0 standard drinks   Drug use: No   Sexual activity: Not on file  Other Topics Concern   Not on file  Social History Narrative   Lives alone   Caffeine use: Drinks soda (20oz per day)   Tea sometimes   Right-handed   Social Determinants of Health   Financial Resource Strain: Not on file  Food Insecurity: Not on file  Transportation Needs: Not on file  Physical Activity: Not on file  Stress: Not on file  Social Connections: Not on file   Outpatient Encounter Medications as of 09/24/2020  Medication Sig   amLODipine (NORVASC) 5 MG tablet Take by mouth daily.   aspirin EC 81 MG tablet Take 81 mg by mouth daily.   cyclobenzaprine (FLEXERIL) 10 MG tablet  Take 10 mg by mouth at bedtime as needed for muscle spasms.    EASY COMFORT PEN NEEDLES 31G X 8 MM MISC USE AS DIRECTED DAILY.   fluticasone (FLONASE) 50 MCG/ACT nasal spray Place 2 sprays into both nostrils daily.   gabapentin (NEURONTIN) 300 MG capsule Take 300 mg by mouth as needed.    glipiZIDE (GLUCOTROL XL) 5 MG 24 hr tablet TAKE ONE TABLET BY MOUTH DAILY WITH BREAKFAST   glucose blood (ONETOUCH VERIO) test strip USE TO CHECK BLOOD SUGAR TWICE DAILY.   ibuprofen (ADVIL,MOTRIN) 800 MG tablet Take 800 mg by mouth 3 (three) times daily as needed for moderate pain.    insulin degludec (TRESIBA FLEXTOUCH) 200 UNIT/ML FlexTouch Pen Inject 110 Units into the skin at bedtime.   lisinopril-hydrochlorothiazide (PRINZIDE,ZESTORETIC) 20-25 MG per tablet Take 1 tablet by mouth daily.   omeprazole (PRILOSEC) 20 MG capsule Take 20 mg by mouth daily.   PARoxetine (PAXIL) 20 MG tablet Take 20 mg by mouth daily.   potassium chloride (K-DUR,KLOR-CON) 10 MEQ tablet Take 10 mEq by mouth daily.   rosuvastatin (CRESTOR) 20 MG tablet Take 1 tablet (20 mg total) by mouth daily.   sitaGLIPtin (JANUVIA) 100 MG tablet Take 1 tablet (100 mg total) by mouth daily.   tolterodine (DETROL LA) 4 MG 24 hr capsule Take  4 mg by mouth daily.   traMADol (ULTRAM) 50 MG tablet Take 2 tablets (100 mg total) by mouth every 6 (six) hours as needed for moderate pain.   Vitamin D, Ergocalciferol, (DRISDOL) 50000 UNITS CAPS capsule Take 50,000 Units by mouth every Monday.    [DISCONTINUED] glucose blood (ONETOUCH VERIO) test strip USE AS DIRECTED FOUR TIMES DAILY   [DISCONTINUED] rosuvastatin (CRESTOR) 10 MG tablet TAKE ONE TABLET BY MOUTH EVERY DAY   [DISCONTINUED] sitaGLIPtin (JANUVIA) 100 MG tablet Take 1 tablet (100 mg total) by mouth daily.   No facility-administered encounter medications on file as of 09/24/2020.   ALLERGIES: Allergies  Allergen Reactions   Metformin Diarrhea   Statins Other (See Comments)    Weak all over  , muscle weakness    Sulfa Antibiotics Rash and Other (See Comments)    Gets a fever    Sulfasalazine Other (See Comments) and Rash    Gets a fever    VACCINATION STATUS:  There is no immunization history on file for this patient.  Diabetes She presents for her follow-up diabetic visit. She has type 2 diabetes mellitus. Onset time: Diagnosed at approximate age of 36 yrs. Her disease course has been improving. There are no hypoglycemic associated symptoms. Pertinent negatives for hypoglycemia include no confusion, headaches, pallor or seizures. There are no diabetic associated symptoms. Pertinent negatives for diabetes include no chest pain and no polyuria. Symptoms are improving. There are no diabetic complications. Risk factors for coronary artery disease include dyslipidemia, hypertension and sedentary lifestyle. Current diabetic treatment includes insulin injections and oral agent (monotherapy). Her weight is fluctuating minimally. She is following a generally unhealthy diet. Diabetic meal planning: Admits to dietary indiscretion. She drinks soda regularly. She rarely participates in exercise. Her home blood glucose trend is decreasing steadily. Her breakfast blood glucose range is generally 140-180 mg/dl. Her bedtime blood glucose range is generally 180-200 mg/dl. Her overall blood glucose range is 180-200 mg/dl. (She presents with improved glycemic profile.  Her point-of-care A1c is 8%, progressively improving from 9.4% last year.  She did not document hypoglycemia.    )  Hypertension This is a chronic problem. The current episode started more than 1 year ago. Pertinent negatives include no chest pain, headaches, palpitations or shortness of breath. Risk factors for coronary artery disease include family history, dyslipidemia, sedentary lifestyle, diabetes mellitus and post-menopausal state. Past treatments include ACE inhibitors.  Hyperlipidemia This is a chronic problem. The current episode  started more than 1 year ago. The problem is uncontrolled. Exacerbating diseases include diabetes and obesity. Pertinent negatives include no chest pain, myalgias or shortness of breath. Current antihyperlipidemic treatment includes statins. Risk factors for coronary artery disease include diabetes mellitus, dyslipidemia, hypertension, obesity, post-menopausal, a sedentary lifestyle and family history.     Review of systems  Constitutional: + Minimally fluctuating body weight,  current  Body mass index is 34.78 kg/m. , no fatigue, no subjective hyperthermia, no subjective hypothermia     Objective:    BP 119/72   Pulse 91   Ht '5\' 4"'  (1.626 m)   Wt 202 lb 9.6 oz (91.9 kg)   BMI 34.78 kg/m   Wt Readings from Last 3 Encounters:  09/24/20 202 lb 9.6 oz (91.9 kg)  06/16/20 202 lb 12.8 oz (92 kg)  03/03/20 205 lb (93 kg)        Results for orders placed or performed in visit on 06/16/20  Comprehensive metabolic panel  Result Value Ref Range  Glucose 110 (H) 65 - 99 mg/dL   BUN 15 8 - 27 mg/dL   Creatinine, Ser 0.66 0.57 - 1.00 mg/dL   eGFR 95 >59 mL/min/1.73   BUN/Creatinine Ratio 23 12 - 28   Sodium 141 134 - 144 mmol/L   Potassium 3.8 3.5 - 5.2 mmol/L   Chloride 100 96 - 106 mmol/L   CO2 26 20 - 29 mmol/L   Calcium 9.5 8.7 - 10.3 mg/dL   Total Protein 7.0 6.0 - 8.5 g/dL   Albumin 4.2 3.8 - 4.8 g/dL   Globulin, Total 2.8 1.5 - 4.5 g/dL   Albumin/Globulin Ratio 1.5 1.2 - 2.2   Bilirubin Total 0.3 0.0 - 1.2 mg/dL   Alkaline Phosphatase 63 44 - 121 IU/L   AST 42 (H) 0 - 40 IU/L   ALT 37 (H) 0 - 32 IU/L  Lipid panel  Result Value Ref Range   Cholesterol, Total 229 (H) 100 - 199 mg/dL   Triglycerides 100 0 - 149 mg/dL   HDL 41 >39 mg/dL   VLDL Cholesterol Cal 18 5 - 40 mg/dL   LDL Chol Calc (NIH) 170 (H) 0 - 99 mg/dL   Chol/HDL Ratio 5.6 (H) 0.0 - 4.4 ratio  TSH  Result Value Ref Range   TSH 1.330 0.450 - 4.500 uIU/mL  T4, free  Result Value Ref Range   Free T4  0.95 0.82 - 1.77 ng/dL  HgB A1c  Result Value Ref Range   Hemoglobin A1C     HbA1c POC (<> result, manual entry)     HbA1c, POC (prediabetic range)     HbA1c, POC (controlled diabetic range) 8.2 (A) 0.0 - 7.0 %   Complete Blood Count (Most recent): Lab Results  Component Value Date   WBC 6.9 07/26/2016   HGB 12.7 07/26/2016   HCT 39.4 07/26/2016   MCV 85.5 07/26/2016   PLT 240 07/26/2016   Chemistry (most recent): Lab Results  Component Value Date   NA 141 09/15/2020   K 3.8 09/15/2020   CL 100 09/15/2020   CO2 26 09/15/2020   BUN 15 09/15/2020   CREATININE 0.66 09/15/2020   Diabetic Labs (most recent): Lab Results  Component Value Date   HGBA1C 8.2 (A) 06/16/2020   HGBA1C 8.4 (A) 03/03/2020   HGBA1C 9.4 (A) 10/18/2019   Lipid Panel     Component Value Date/Time   CHOL 229 (H) 09/15/2020 0802   TRIG 100 09/15/2020 0802   HDL 41 09/15/2020 0802   CHOLHDL 5.6 (H) 09/15/2020 0802   CHOLHDL 4.9 06/29/2019 0819   VLDL 36 (H) 08/04/2016 1157   LDLCALC 170 (H) 09/15/2020 0802   LDLCALC 142 (H) 06/29/2019 0819     Assessment & Plan:   1. Uncontrolled type 2 diabetes mellitus without complication, with long-term current use of insulin ([place])  She presents with improved glycemic profile.  Her point-of-care A1c is 8%, progressively improving from 9.4% last year.  She did not document hypoglycemia.       Recent labs reviewed. Patient remains at a high risk for more acute and chronic complications of diabetes which include CAD, CVA, CKD, retinopathy, and neuropathy. These are all discussed in detail with the patient. I have re-counseled the patient on diet management and weight loss, by adopting a carbohydrate restricted / protein rich  Diet.   - she acknowledges that there is a room for improvement in her food and drink choices. - Suggestion is made for her to avoid simple  carbohydrates  from her diet including Cakes, Sweet Desserts, Ice Cream, Soda (diet and  regular), Sweet Tea, Candies, Chips, Cookies, Store Bought Juices, Alcohol in Excess of  1-2 drinks a day, Artificial Sweeteners,  Coffee Creamer, and "Sugar-free" Products, Lemonade. This will help patient to have more stable blood glucose profile and potentially avoid unintended weight gain.  I have approached patient to continue on  intensive monitoring of blood glucose and insulin therapy, and patient agrees.   -In light of her presentation with near target glycemic profile, she is advised to continue her current dose of Tresiba at 110 units nightly,   associated with monitoring of blood glucose at least twice a day-daily before breakfast and at bedtime. - Patient is warned not to take insulin without proper monitoring per orders.  -Patient is encouraged to call clinic for blood glucose levels less than 70 or above 200 mg /dl at fasting. -She is known to have intolerance to metformin.   -She is advised to continue Januvia 100 mg p.o. daily at breakfast, glipizide 5 mg XL p.o. daily at breakfast.     2) HTN: -Her blood pressure is controlled to target.  -She is advised to continue her amlodipine 5 mg p.o. daily, her lisinopril/HCTZ this morning.    3) HPL: She presents with loss of control of her LDL to 170 from 118.  She admits that she has not been taking her Crestor.  I discussed and emphasized the need for Crestor at higher dose of 20 mg p.o. nightly.   Plant predominant lifestyle nutrition discussed and recommended to her.   Side effects and precautions discussed with him.     4.  Weight management: Her BMI is 34.8 .  She is a candidate for modest weight loss.  I discussed the fact that-10% weight loss will help significantly impact on her diabetes care.  5)  Chronic Care: -Patient is on ACEI and Statin medications and encouraged to continue to follow up with Ophthalmology, Podiatrist at least yearly or according to recommendations, and advised to stay away from smoking. I have  recommended yearly flu vaccine and pneumonia vaccination at least every 5 years; moderate intensity exercise for up to 150 minutes weekly; and  sleep for at least 7 hours a day.   Her screening ABI was negative for PAD in February 2022.  This study will be repeated in February 2027, or sooner if needed.    She is advised to maintain close follow-up with her PMD Catalina Antigua, MD   I spent 32 minutes in the care of the patient today including review of labs from [facility], Lipids, Thyroid Function, Hematology (current and previous including abstractions from other facilities); face-to-face time discussing  her blood glucose readings/logs, discussing hypoglycemia and hyperglycemia episodes and symptoms, medications doses, her options of short and long term treatment based on the latest standards of care / guidelines;  discussion about incorporating lifestyle medicine;  and documenting the encounter.    Please refer to Patient Instructions for Blood Glucose Monitoring and Insulin/Medications Dosing Guide"  in media tab for additional information. Please  also refer to " Patient Self Inventory" in the Media  tab for reviewed elements of pertinent patient history.  Rebecca Roberson participated in the discussions, expressed understanding, and voiced agreement with the above plans.  All questions were answered to her satisfaction. she is encouraged to contact clinic should she have any questions or concerns prior to her return visit.   Follow up plan: Return  in about 6 months (around 03/24/2021) for F/U with Pre-visit Labs, Meter, Logs, A1c here.Glade Lloyd, MD Phone: [phone]  Fax: [phone]  -  This note was partially dictated with voice recognition software. Similar sounding words can be transcribed inadequately or may not  be corrected upon review.  09/24/2020, 5:07 PM

## 2020-09-24 NOTE — Telephone Encounter (Signed)
error 

## 2020-09-24 NOTE — Patient Instructions (Signed)

## 2020-11-20 ENCOUNTER — Other Ambulatory Visit: Payer: Self-pay | Admitting: "Endocrinology

## 2020-11-20 ENCOUNTER — Other Ambulatory Visit: Payer: Self-pay | Admitting: Nurse Practitioner

## 2020-12-04 ENCOUNTER — Other Ambulatory Visit: Payer: Self-pay

## 2020-12-04 ENCOUNTER — Encounter: Payer: Self-pay | Admitting: Urology

## 2020-12-04 ENCOUNTER — Ambulatory Visit: Payer: BC Managed Care – PPO | Admitting: Urology

## 2020-12-04 VITALS — BP 108/66 | HR 83 | Temp 98.3°F

## 2020-12-04 DIAGNOSIS — N200 Calculus of kidney: Secondary | ICD-10-CM | POA: Diagnosis not present

## 2020-12-04 DIAGNOSIS — N3941 Urge incontinence: Secondary | ICD-10-CM

## 2020-12-04 LAB — URINALYSIS, ROUTINE W REFLEX MICROSCOPIC
Bilirubin, UA: NEGATIVE
Leukocytes,UA: NEGATIVE
Nitrite, UA: NEGATIVE
RBC, UA: NEGATIVE
Specific Gravity, UA: >1.03 — ABNORMAL HIGH (ref 1.005–1.030)
Urobilinogen, Ur: 2 mg/dL — ABNORMAL HIGH (ref 0.2–1.0)
pH, UA: 5.5 (ref 5.0–7.5)

## 2020-12-04 NOTE — Progress Notes (Signed)
Subjective:  1. Calculus, renal   2. Urge incontinence     I have kidney stones. HPI: Rebecca Roberson is a 70 year-old female established patient who is here for renal calculi.   Rebecca Roberson returns today in f/u for her history of stones. She had a KUB on 08/28/19 and was found to have a stable small RUP stone.  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    02/25/2017: No stone events since last visit. KUB shows stable 43mm right upper pole calculus.   12/04/20: Rebecca Roberson returns today in f/u for her history of stones.   She has had no worrisome symptoms and her UA is unremarkable.  She last had a KUB in 8/21 and had a stable 5mm RUP stone.  She has OAB wet and was seen in [place] and she has been on Detrol LA 4mg  for some time and is currently on solifenacin 5mg  daily which has helped her nocturia and incontinence.   The Myrbetriq and [place] Bores were too expensive.        ROS:  ROS:  A complete review of systems was performed.  All systems are negative except for pertinent findings as noted.   ROS  Allergies  Allergen Reactions   Metformin Diarrhea   Statins Other (See Comments)    Weak all over , muscle weakness    Sulfa Antibiotics Rash and Other (See Comments)    Gets a fever    Sulfasalazine Other (See Comments) and Rash    Gets a fever     Outpatient Encounter Medications as of 12/04/2020  Medication Sig   aspirin EC 81 MG tablet Take 81 mg by mouth daily.   cyclobenzaprine (FLEXERIL) 10 MG tablet Take 10 mg by mouth at bedtime as needed for muscle spasms.    EASY COMFORT PEN NEEDLES 31G X 8 MM MISC USE AS DIRECTED DAILY.   fluticasone (FLONASE) 50 MCG/ACT nasal spray Place 2 sprays into both nostrils daily.   gabapentin (NEURONTIN) 300 MG capsule Take 300 mg by mouth as needed.    glipiZIDE (GLUCOTROL XL) 5 MG 24 hr tablet TAKE ONE TABLET BY MOUTH DAILY WITH BREAKFAST   glucose blood (ONETOUCH VERIO) test strip USE TO CHECK BLOOD SUGAR TWICE DAILY.   ibuprofen  (ADVIL,MOTRIN) 800 MG tablet Take 800 mg by mouth 3 (three) times daily as needed for moderate pain.    lisinopril-hydrochlorothiazide (PRINZIDE,ZESTORETIC) 20-25 MG per tablet Take 1 tablet by mouth daily.   omeprazole (PRILOSEC) 20 MG capsule Take 20 mg by mouth daily.   PARoxetine (PAXIL) 20 MG tablet Take 20 mg by mouth daily.   potassium chloride (K-DUR,KLOR-CON) 10 MEQ tablet Take 10 mEq by mouth daily.   rosuvastatin (CRESTOR) 20 MG tablet Take 1 tablet (20 mg total) by mouth daily.   sitaGLIPtin (JANUVIA) 100 MG tablet Take 1 tablet (100 mg total) by mouth daily.   solifenacin (VESICARE) 5 MG tablet Take 5 mg by mouth daily.   tolterodine (DETROL LA) 4 MG 24 hr capsule Take 4 mg by mouth daily.   traMADol (ULTRAM) 50 MG tablet Take 2 tablets (100 mg total) by mouth every 6 (six) hours as needed for moderate pain.   TRESIBA FLEXTOUCH 200 UNIT/ML FlexTouch Pen INJECT 100 UNITS SUBCUTANEOUSLY AT BEDTIME   Vitamin D, Ergocalciferol, (DRISDOL) 50000 UNITS CAPS capsule Take 50,000 Units by mouth every Monday.    amLODipine (NORVASC) 5 MG tablet Take by mouth daily.   No facility-administered encounter medications on file  as of 12/04/2020.    Past Medical History:  Diagnosis Date   Arthritis    Cervical myelopathy ([place]) 06/30/2016   Diabetes mellitus without complication (HCC)    GERD (gastroesophageal reflux disease)    Heart murmur    History of kidney stones    has one now   Hyperlipidemia    Hypertension    Sleep apnea    cpap have not used 3 yrs    Past Surgical History:  Procedure Laterality Date   ANTERIOR CERVICAL DECOMP/DISCECTOMY FUSION N/A 07/30/2016   Procedure: ANTERIOR CERVICAL DECOMPRESSION/DISCECTOMY FUSION CERVICAL TWO-THREE;  Surgeon: Ashok Pall, MD;  Location: [facility];  Service: Neurosurgery;  Laterality: N/A;  ANTERIOR CERVICAL DECOMPRESSION/DISCECTOMY FUSION CERVICAL 2- CERVICAL 3   CERVICAL CONE BIOPSY      Social History   Socioeconomic History   Marital  status: Single    Spouse name: Not on file   Number of children: 0   Years of education: 14   Highest education level: Not on file  Occupational History   Occupation: [employer]  Tobacco Use   Smoking status: Never   Smokeless tobacco: Never  Vaping Use   Vaping Use: Never used  Substance and Sexual Activity   Alcohol use: No    Alcohol/week: 0.0 standard drinks   Drug use: No   Sexual activity: Not on file  Other Topics Concern   Not on file  Social History Narrative   Lives alone   Caffeine use: Drinks soda (20oz per day)   Tea sometimes   Right-handed   Social Determinants of Health   Financial Resource Strain: Not on file  Food Insecurity: Not on file  Transportation Needs: Not on file  Physical Activity: Not on file  Stress: Not on file  Social Connections: Not on file  Intimate Partner Violence: Not on file    Family History  Problem Relation Age of Onset   Kidney disease Father    Thyroid disease Father        Objective: Vitals:   12/04/20 1557  BP: 108/66  Pulse: 83  Temp: 98.3 F (36.8 C)     Physical Exam  Lab Results:  Results for orders placed or performed in visit on 12/04/20 (from the past 24 hour(s))  Urinalysis, Routine w reflex microscopic     Status: Abnormal   Collection Time: 12/04/20  4:03 PM  Result Value Ref Range   Specific Gravity, UA >1.030 (H) 1.005 - 1.030   pH, UA 5.5 5.0 - 7.5   Color, UA Amber (A) Yellow   Appearance Ur Clear Clear   Leukocytes,UA Negative Negative   Protein,UA Trace (A) Negative/Trace   Glucose, UA Trace (A) Negative   Ketones, UA 1+ (A) Negative   RBC, UA Negative Negative   Bilirubin, UA Negative Negative   Urobilinogen, Ur 2.0 (H) 0.2 - 1.0 mg/dL   Nitrite, UA Negative Negative   Microscopic Examination Comment    Narrative   Performed at:  [facility] [address], [place], [place]  [id number] Lab Director: Mina Marble MT, Phone:  [phone]     BMET No  results for input(s): NA, K, CL, CO2, GLUCOSE, BUN, CREATININE, CALCIUM in the last 72 hours. PSA No results found for: PSA No results found for: TESTOSTERONE    Studies/Results: KUB reviewed.        Assessment & Plan: Right upper pole renal stone.  KUB this week and in a year with f/u.  OAB wet doing well on tolteradine with the addition of solifenacin 5mg .    No orders of the defined types were placed in this encounter.    Orders Placed This Encounter  Procedures   DG Abd 1 View    Standing Status:   Future    Standing Expiration Date:   12/04/2021    Order Specific Question:   Reason for Exam (SYMPTOM  OR DIAGNOSIS REQUIRED)    Answer:   renal stone    Order Specific Question:   Preferred imaging location?    Answer:   [facility]    Order Specific Question:   Radiology Contrast Protocol - do NOT remove file path    Answer:   [email or web address]   DG Abd 1 View    Standing Status:   Future    Standing Expiration Date:   12/04/2021    Order Specific Question:   Reason for Exam (SYMPTOM  OR DIAGNOSIS REQUIRED)    Answer:   renal stone    Order Specific Question:   Preferred imaging location?    Answer:   [facility]    Order Specific Question:   Radiology Contrast Protocol - do NOT remove file path    Answer:   [email or web address]   Urinalysis, Routine w reflex microscopic      Return in about 1 year (around 12/04/2021) for with KUB.   CC: Leeanne Rio, MD      Irine Seal 12/04/2020 Patient ID: Ramond Craver, female   DOB: Nov 27, 1950, 70 y.o.   MRN: [id number]

## 2020-12-04 NOTE — Progress Notes (Signed)
Urological Symptom Review  Patient is experiencing the following symptoms: Frequent urination Get up at night to urinate Leakage of urine Kidney stones   Review of Systems  Gastrointestinal (upper)  : Indigestion/heartburn  Gastrointestinal (lower) : Constipation  Constitutional : Night Sweats Fatigue  Skin: Itching  Eyes: Blurred vision  Ear/Nose/Throat : Sinus problems  Hematologic/Lymphatic: Negative for Hematologic/Lymphatic symptoms  Cardiovascular : Negative for cardiovascular symptoms  Respiratory : Cough  Endocrine: Excessive thirst  Musculoskeletal: Back pain Joint pain  Neurological: Dizziness  Psychologic: Depression Anxiety

## 2020-12-08 ENCOUNTER — Ambulatory Visit (HOSPITAL_COMMUNITY)
Admission: RE | Admit: 2020-12-08 | Discharge: 2020-12-08 | Disposition: A | Discharge status: Home / Self Care | Payer: BC Managed Care – PPO | Source: Ambulatory Visit | Attending: Urology | Admitting: Urology

## 2020-12-08 ENCOUNTER — Other Ambulatory Visit: Payer: Self-pay

## 2020-12-08 DIAGNOSIS — N200 Calculus of kidney: Secondary | ICD-10-CM | POA: Diagnosis not present

## 2020-12-09 NOTE — Progress Notes (Signed)
Sent via mail 

## 2020-12-15 ENCOUNTER — Other Ambulatory Visit: Payer: Self-pay | Admitting: "Endocrinology

## 2021-01-02 ENCOUNTER — Other Ambulatory Visit: Payer: Self-pay | Admitting: "Endocrinology

## 2021-03-10 ENCOUNTER — Other Ambulatory Visit: Payer: Self-pay | Admitting: "Endocrinology

## 2021-03-19 LAB — LIPID PANEL
Chol/HDL Ratio: 4.9 ratio — ABNORMAL HIGH (ref 0.0–4.4)
Cholesterol, Total: 190 mg/dL (ref 100–199)
HDL: 39 mg/dL — ABNORMAL LOW (ref >39)
LDL Chol Calc (NIH): 132 mg/dL — ABNORMAL HIGH (ref 0–99)
Triglycerides: 104 mg/dL (ref 0–149)
VLDL Cholesterol Cal: 19 mg/dL (ref 5–40)

## 2021-03-20 ENCOUNTER — Other Ambulatory Visit: Payer: Self-pay | Admitting: "Endocrinology

## 2021-03-24 ENCOUNTER — Ambulatory Visit: Payer: BC Managed Care – PPO | Admitting: "Endocrinology

## 2021-03-25 ENCOUNTER — Encounter: Payer: Self-pay | Admitting: "Endocrinology

## 2021-03-25 ENCOUNTER — Ambulatory Visit: Payer: BC Managed Care – PPO | Admitting: "Endocrinology

## 2021-03-25 ENCOUNTER — Other Ambulatory Visit: Payer: Self-pay

## 2021-03-25 VITALS — BP 110/69 | HR 79 | Ht 64.0 in | Wt 201.8 lb

## 2021-03-25 DIAGNOSIS — E1165 Type 2 diabetes mellitus with hyperglycemia: Secondary | ICD-10-CM

## 2021-03-25 LAB — POCT GLYCOSYLATED HEMOGLOBIN (HGB A1C): HbA1c, POC (controlled diabetic range): 8.7 % — AB (ref 0.0–7.0)

## 2021-03-25 MED ORDER — TRULICITY 1.5 MG/0.5ML ~~LOC~~ SOAJ: 1.5000 mg | SUBCUTANEOUS | 2 refills | Status: DC

## 2021-03-25 MED ORDER — TRESIBA FLEXTOUCH 200 UNIT/ML ~~LOC~~ SOPN: 120.0000 [IU] | PEN_INJECTOR | Freq: Every day | SUBCUTANEOUS | 2 refills | Status: DC

## 2021-03-25 NOTE — Progress Notes (Signed)
03/25/2021   Endocrinology follow-up note    Subjective:    Patient ID: Rebecca Roberson, female    DOB: [birth year],    Past Medical History:  Diagnosis Date   Arthritis    Cervical myelopathy ([place]) 06/30/2016   Diabetes mellitus without complication (HCC)    GERD (gastroesophageal reflux disease)    Heart murmur    History of kidney stones    has one now   Hyperlipidemia    Hypertension    Sleep apnea    cpap have not used 3 yrs   Past Surgical History:  Procedure Laterality Date   ANTERIOR CERVICAL DECOMP/DISCECTOMY FUSION N/A 07/30/2016   Procedure: ANTERIOR CERVICAL DECOMPRESSION/DISCECTOMY FUSION CERVICAL TWO-THREE;  Surgeon: Ashok Pall, MD;  Location: [place];  Service: Neurosurgery;  Laterality: N/A;  ANTERIOR CERVICAL DECOMPRESSION/DISCECTOMY FUSION CERVICAL 2- CERVICAL 3   CERVICAL CONE BIOPSY     Social History   Socioeconomic History   Marital status: Single    Spouse name: Not on file   Number of children: 0   Years of education: 14   Highest education level: Not on file  Occupational History   Occupation: [employer]  Tobacco Use   Smoking status: Never   Smokeless tobacco: Never  Vaping Use   Vaping Use: Never used  Substance and Sexual Activity   Alcohol use: No    Alcohol/week: 0.0 standard drinks   Drug use: No   Sexual activity: Not on file  Other Topics Concern   Not on file  Social History Narrative   Lives alone   Caffeine use: Drinks soda (20oz per day)   Tea sometimes   Right-handed   Social Determinants of Health   Financial Resource Strain: Not on file  Food Insecurity: Not on file  Transportation Needs: Not on file  Physical Activity: Not on file  Stress: Not on file  Social Connections: Not on file   Outpatient Encounter Medications as of 03/25/2021  Medication Sig   Dulaglutide (TRULICITY) 1.5 KX/3.8HW SOPN Inject 1.5 mg into the skin once a week.   amLODipine (NORVASC) 5 MG tablet Take by mouth daily.   aspirin EC  81 MG tablet Take 81 mg by mouth daily.   cyclobenzaprine (FLEXERIL) 10 MG tablet Take 10 mg by mouth at bedtime as needed for muscle spasms.    EASY COMFORT PEN NEEDLES 31G X 8 MM MISC USE AS DIRECTED DAILY.   fluticasone (FLONASE) 50 MCG/ACT nasal spray Place 2 sprays into both nostrils daily.   gabapentin (NEURONTIN) 300 MG capsule Take 300 mg by mouth as needed.    glipiZIDE (GLUCOTROL XL) 5 MG 24 hr tablet TAKE ONE TABLET BY MOUTH DAILY WITH BREAKFAST   ibuprofen (ADVIL,MOTRIN) 800 MG tablet Take 800 mg by mouth 3 (three) times daily as needed for moderate pain.    insulin degludec (TRESIBA FLEXTOUCH) 200 UNIT/ML FlexTouch Pen Inject 120 Units into the skin at bedtime.   lisinopril-hydrochlorothiazide (PRINZIDE,ZESTORETIC) 20-25 MG per tablet Take 1 tablet by mouth daily.   omeprazole (PRILOSEC) 20 MG capsule Take 20 mg by mouth daily.   ONETOUCH VERIO test strip USE TO CHECK BLOOD SUGAR TWICE DAILY.   PARoxetine (PAXIL) 20 MG tablet Take 20 mg by mouth daily.   potassium chloride (K-DUR,KLOR-CON) 10 MEQ tablet Take 10 mEq by mouth daily.   rosuvastatin (CRESTOR) 20 MG tablet TAKE ONE TABLET BY MOUTH ONCE DAILY   solifenacin (VESICARE) 5 MG tablet Take 5 mg by mouth daily.  tolterodine (DETROL LA) 4 MG 24 hr capsule Take 4 mg by mouth daily.   traMADol (ULTRAM) 50 MG tablet Take 2 tablets (100 mg total) by mouth every 6 (six) hours as needed for moderate pain.   Vitamin D, Ergocalciferol, (DRISDOL) 50000 UNITS CAPS capsule Take 50,000 Units by mouth every Monday.    [DISCONTINUED] insulin degludec (TRESIBA FLEXTOUCH) 200 UNIT/ML FlexTouch Pen Inject 110 Units into the skin at bedtime.   [DISCONTINUED] sitaGLIPtin (JANUVIA) 100 MG tablet Take 1 tablet (100 mg total) by mouth daily.   No facility-administered encounter medications on file as of 03/25/2021.   ALLERGIES: Allergies  Allergen Reactions   Metformin Diarrhea   Statins Other (See Comments)    Weak all over , muscle weakness     Sulfa Antibiotics Rash and Other (See Comments)    Gets a fever    Sulfasalazine Other (See Comments) and Rash    Gets a fever    VACCINATION STATUS:  There is no immunization history on file for this patient.  Diabetes She presents for her follow-up diabetic visit. She has type 2 diabetes mellitus. Onset time: Diagnosed at approximate age of 40 yrs. Her disease course has been worsening. There are no hypoglycemic associated symptoms. Pertinent negatives for hypoglycemia include no confusion, headaches, pallor or seizures. There are no diabetic associated symptoms. Pertinent negatives for diabetes include no chest pain and no polyuria. Symptoms are worsening. There are no diabetic complications. Risk factors for coronary artery disease include dyslipidemia, hypertension and sedentary lifestyle. Current diabetic treatment includes insulin injections (she is on Tresiba 110 units qhs, Januvia 100mg  po qam, glipizide 5 mg qam.). Her weight is fluctuating minimally. She is following a generally unhealthy diet. When asked about meal planning, she reported none (Admits to dietary indiscretion. She drinks soda regularly.). She rarely participates in exercise. Her home blood glucose trend is increasing steadily. Her breakfast blood glucose range is generally 140-180 mg/dl. Her overall blood glucose range is 180-200 mg/dl. (She presents with worsening glycemic profile.  Her point-of-care A1c is up to 8.7% increasing from 8%. She did not document hypoglycemia.    )  Hypertension This is a chronic problem. The current episode started more than 1 year ago. Pertinent negatives include no chest pain, headaches, palpitations or shortness of breath. Risk factors for coronary artery disease include family history, dyslipidemia, sedentary lifestyle, diabetes mellitus and post-menopausal state. Past treatments include ACE inhibitors.  Hyperlipidemia This is a chronic problem. The current episode started more than 1  year ago. The problem is uncontrolled. Exacerbating diseases include diabetes and obesity. Pertinent negatives include no chest pain, myalgias or shortness of breath. Current antihyperlipidemic treatment includes statins. Risk factors for coronary artery disease include diabetes mellitus, dyslipidemia, hypertension, obesity, post-menopausal, a sedentary lifestyle and family history.     Review of systems  Constitutional: + Minimally fluctuating body weight,  current  Body mass index is 34.64 kg/m. , no fatigue, no subjective hyperthermia, no subjective hypothermia     Objective:    BP 110/69    Pulse 79    Ht 5\' 4"  (1.626 m)    Wt 201 lb 12.8 oz (91.5 kg)    BMI 34.64 kg/m   Wt Readings from Last 3 Encounters:  03/25/21 201 lb 12.8 oz (91.5 kg)  09/24/20 202 lb 9.6 oz (91.9 kg)  06/16/20 202 lb 12.8 oz (92 kg)     Results for orders placed or performed in visit on 03/25/21  HgB A1c  Result  Value Ref Range   Hemoglobin A1C     HbA1c POC (<> result, manual entry)     HbA1c, POC (prediabetic range)     HbA1c, POC (controlled diabetic range) 8.7 (A) 0.0 - 7.0 %   Complete Blood Count (Most recent): Lab Results  Component Value Date   WBC 6.9 07/26/2016   HGB 12.7 07/26/2016   HCT 39.4 07/26/2016   MCV 85.5 07/26/2016   PLT 240 07/26/2016   Chemistry (most recent): Lab Results  Component Value Date   NA 141 09/15/2020   K 3.8 09/15/2020   CL 100 09/15/2020   CO2 26 09/15/2020   BUN 15 09/15/2020   CREATININE 0.66 09/15/2020   Diabetic Labs (most recent): Lab Results  Component Value Date   HGBA1C 8.7 (A) 03/25/2021   HGBA1C 8.2 (A) 06/16/2020   HGBA1C 8.4 (A) 03/03/2020   Lipid Panel     Component Value Date/Time   CHOL 190 03/18/2021 0851   TRIG 104 03/18/2021 0851   HDL 39 (L) 03/18/2021 0851   CHOLHDL 4.9 (H) 03/18/2021 0851   CHOLHDL 4.9 06/29/2019 0819   VLDL 36 (H) 08/04/2016 1157   LDLCALC 132 (H) 03/18/2021 0851   LDLCALC 142 (H) 06/29/2019 0819      Assessment & Plan:   1. Uncontrolled type 2 diabetes mellitus without complication, with long-term current use of insulin ([place])  She presents with worsening glycemic profile.  Her point-of-care A1c is up to 8.7% increasing from 8%. She did not document hypoglycemia.   Recent labs reviewed. Patient remains at a high risk for more acute and chronic complications of diabetes which include CAD, CVA, CKD, retinopathy, and neuropathy. These are all discussed in detail with the patient. I have re-counseled the patient on diet management and weight loss, by adopting a carbohydrate restricted / protein rich  Diet. This patient will benefit from Lifestyle Medicine. - she acknowledges that there is a room for improvement in her food and drink choices. - Suggestion is made for her to avoid simple carbohydrates  from her diet including Cakes, Sweet Desserts, Ice Cream, Soda (diet and regular), Sweet Tea, Candies, Chips, Cookies, Store Bought Juices, Alcohol , Artificial Sweeteners,  Coffee Creamer, and "Sugar-free" Products, Lemonade. This will help patient to have more stable blood glucose profile and potentially avoid unintended weight gain.  The following Lifestyle Medicine recommendations according to [place]  [facility]) were discussed and and offered to patient and she  agrees to start the journey:  A. Whole Foods, Plant-Based Nutrition comprising of fruits and vegetables, plant-based proteins, whole-grain carbohydrates was discussed in detail with the patient.   A list for source of those nutrients were also provided to the patient.  Patient will use only water or unsweetened tea for hydration. B.  The need to stay away from risky substances including alcohol, smoking; obtaining 7 to 9 hours of restorative sleep, at least 150 minutes of moderate intensity exercise weekly, the importance of healthy social connections,  and stress management techniques were discussed. C.  A  full color page of  Calorie density of various food groups per pound showing examples of each food groups was provided to the patient.  -In light of her presentation with significantly above target glycemic profile, she is advised to increase  Tresiba to 120 units nightly,   associated with monitoring of blood glucose at least twice a day-daily before breakfast and at bedtime. - Patient is warned not to take insulin  without proper monitoring per orders.  -Patient is encouraged to call clinic for blood glucose levels less than 70 or above 200 mg /dl at fasting. -She is known to have intolerance to metformin.   -She is advised to stop Januvia , discussed and started Trulicity 1.5 mg [place] weekly. - she is advised to continue glipizide 5 mg XL p.o. daily at breakfast.     2) HTN: - Her BP is controlled to target. -She is advised to continue her amlodipine 5 mg p.o. daily, her lisinopril/HCTZ this morning.    3) HPL: She presents with still high LDL of 132 , only slightly better than 170 during last check. She took her Crestor intermittently. She is advised to be consistent with Crestor 20 mg p.o. nightly.   Plant predominant lifestyle nutrition discussed and recommended to her.   Side effects and precautions discussed with him.     4.  Weight management: Her BMI is 34.64 .  She is a candidate for modest weight loss.  I discussed the fact that-10% weight loss will help significantly impact on her diabetes care.  5)  Chronic Care: -Patient is on ACEI and Statin medications and encouraged to continue to follow up with Ophthalmology, Podiatrist at least yearly or according to recommendations, and advised to stay away from smoking. I have recommended yearly flu vaccine and pneumonia vaccination at least every 5 years; moderate intensity exercise for up to 150 minutes weekly; and  sleep for at least 7 hours a day.   Her screening ABI was negative for PAD in February 2022.  This study will be repeated  in February 2027, or sooner if needed.    She is advised to maintain close follow-up with her PMD Catalina Antigua, MD    I spent 43 minutes in the care of the patient today including review of labs from [place], Lipids, Thyroid Function, Hematology (current and previous including abstractions from other facilities); face-to-face time discussing  her blood glucose readings/logs, discussing hypoglycemia and hyperglycemia episodes and symptoms, medications doses, her options of short and long term treatment based on the latest standards of care / guidelines;  discussion about incorporating lifestyle medicine;  and documenting the encounter.    Please refer to Patient Instructions for Blood Glucose Monitoring and Insulin/Medications Dosing Guide"  in media tab for additional information. Please  also refer to " Patient Self Inventory" in the Media  tab for reviewed elements of pertinent patient history.  Rebecca Roberson participated in the discussions, expressed understanding, and voiced agreement with the above plans.  All questions were answered to her satisfaction. she is encouraged to contact clinic should she have any questions or concerns prior to her return visit.    Follow up plan: Return in about 3 months (around 06/25/2021) for Bring Meter and Logs- A1c in Office.  Glade Lloyd, MD Phone: [phone]  Fax: [phone]  -  This note was partially dictated with voice recognition software. Similar sounding words can be transcribed inadequately or may not  be corrected upon review.  03/25/2021, 7:04 PM

## 2021-03-25 NOTE — Patient Instructions (Signed)

## 2021-05-04 ENCOUNTER — Other Ambulatory Visit: Payer: Self-pay | Admitting: "Endocrinology

## 2021-06-18 ENCOUNTER — Other Ambulatory Visit: Payer: Self-pay | Admitting: "Endocrinology

## 2021-06-25 ENCOUNTER — Encounter: Payer: Self-pay | Admitting: "Endocrinology

## 2021-06-25 ENCOUNTER — Ambulatory Visit: Payer: BC Managed Care – PPO | Admitting: "Endocrinology

## 2021-06-25 VITALS — BP 120/56 | HR 88 | Ht 64.0 in | Wt 196.4 lb

## 2021-06-25 DIAGNOSIS — E1165 Type 2 diabetes mellitus with hyperglycemia: Secondary | ICD-10-CM

## 2021-06-25 DIAGNOSIS — E782 Mixed hyperlipidemia: Secondary | ICD-10-CM | POA: Diagnosis not present

## 2021-06-25 LAB — POCT GLYCOSYLATED HEMOGLOBIN (HGB A1C): HbA1c, POC (controlled diabetic range): 7.2 % — AB (ref 0.0–7.0)

## 2021-06-25 MED ORDER — TRULICITY 1.5 MG/0.5ML ~~LOC~~ SOAJ: 1.5000 mg | SUBCUTANEOUS | 1 refills | Status: DC

## 2021-06-25 MED ORDER — TRESIBA FLEXTOUCH 200 UNIT/ML ~~LOC~~ SOPN: 100.0000 [IU] | PEN_INJECTOR | Freq: Every day | SUBCUTANEOUS | 2 refills | Status: DC

## 2021-06-25 NOTE — Progress Notes (Signed)
06/25/2021   Endocrinology follow-up note    Subjective:    Patient ID: Rebecca Roberson, female    DOB: Sep 25, 1950,    Past Medical History:  Diagnosis Date   Arthritis    Cervical myelopathy ([place]) 06/30/2016   Diabetes mellitus without complication (HCC)    GERD (gastroesophageal reflux disease)    Heart murmur    History of kidney stones    has one now   Hyperlipidemia    Hypertension    Sleep apnea    cpap have not used 3 yrs   Past Surgical History:  Procedure Laterality Date   ANTERIOR CERVICAL DECOMP/DISCECTOMY FUSION N/A 07/30/2016   Procedure: ANTERIOR CERVICAL DECOMPRESSION/DISCECTOMY FUSION CERVICAL TWO-THREE;  Surgeon: Ashok Pall, MD;  Location: [facility];  Service: Neurosurgery;  Laterality: N/A;  ANTERIOR CERVICAL DECOMPRESSION/DISCECTOMY FUSION CERVICAL 2- CERVICAL 3   CERVICAL CONE BIOPSY     Social History   Socioeconomic History   Marital status: Single    Spouse name: Not on file   Number of children: 0   Years of education: 14   Highest education level: Not on file  Occupational History   Occupation: [employer]  Tobacco Use   Smoking status: Never   Smokeless tobacco: Never  Vaping Use   Vaping Use: Never used  Substance and Sexual Activity   Alcohol use: No    Alcohol/week: 0.0 standard drinks   Drug use: No   Sexual activity: Not on file  Other Topics Concern   Not on file  Social History Narrative   Lives alone   Caffeine use: Drinks soda (20oz per day)   Tea sometimes   Right-handed   Social Determinants of Health   Financial Resource Strain: Not on file  Food Insecurity: Not on file  Transportation Needs: Not on file  Physical Activity: Not on file  Stress: Not on file  Social Connections: Not on file   Outpatient Encounter Medications as of 06/25/2021  Medication Sig   amLODipine (NORVASC) 5 MG tablet Take by mouth daily.   aspirin EC 81 MG tablet Take 81 mg by mouth daily.   cyclobenzaprine (FLEXERIL) 10 MG tablet  Take 10 mg by mouth at bedtime as needed for muscle spasms.    Dulaglutide (TRULICITY) 1.5 HB/7.1IR SOPN Inject 1.5 mg into the skin once a week.   EASY COMFORT PEN NEEDLES 31G X 8 MM MISC USE AS DIRECTED DAILY.   fluticasone (FLONASE) 50 MCG/ACT nasal spray Place 2 sprays into both nostrils daily.   gabapentin (NEURONTIN) 300 MG capsule Take 300 mg by mouth as needed.    glipiZIDE (GLUCOTROL XL) 5 MG 24 hr tablet TAKE ONE TABLET BY MOUTH DAILY WITH BREAKFAST   ibuprofen (ADVIL,MOTRIN) 800 MG tablet Take 800 mg by mouth 3 (three) times daily as needed for moderate pain.    insulin degludec (TRESIBA FLEXTOUCH) 200 UNIT/ML FlexTouch Pen Inject 120 Units into the skin at bedtime.   lisinopril-hydrochlorothiazide (PRINZIDE,ZESTORETIC) 20-25 MG per tablet Take 1 tablet by mouth daily.   omeprazole (PRILOSEC) 20 MG capsule Take 20 mg by mouth daily.   ONETOUCH VERIO test strip USE TO CHECK BLOOD SUGAR TWICE DAILY.   PARoxetine (PAXIL) 20 MG tablet Take 20 mg by mouth daily.   potassium chloride (K-DUR,KLOR-CON) 10 MEQ tablet Take 10 mEq by mouth daily.   rosuvastatin (CRESTOR) 20 MG tablet TAKE ONE TABLET BY MOUTH ONCE DAILY   solifenacin (VESICARE) 5 MG tablet Take 5 mg by mouth daily.  tolterodine (DETROL LA) 4 MG 24 hr capsule Take 4 mg by mouth daily.   traMADol (ULTRAM) 50 MG tablet Take 2 tablets (100 mg total) by mouth every 6 (six) hours as needed for moderate pain.   Vitamin D, Ergocalciferol, (DRISDOL) 50000 UNITS CAPS capsule Take 50,000 Units by mouth every Monday.    No facility-administered encounter medications on file as of 06/25/2021.   ALLERGIES: Allergies  Allergen Reactions   Metformin Diarrhea   Statins Other (See Comments)    Weak all over , muscle weakness    Sulfa Antibiotics Rash and Other (See Comments)    Gets a fever    Sulfasalazine Other (See Comments) and Rash    Gets a fever    VACCINATION STATUS:  There is no immunization history on file for this  patient.  Diabetes She presents for her follow-up diabetic visit. She has type 2 diabetes mellitus. Onset time: Diagnosed at approximate age of 41 yrs. Her disease course has been improving. There are no hypoglycemic associated symptoms. Pertinent negatives for hypoglycemia include no confusion, headaches, pallor or seizures. There are no diabetic associated symptoms. Pertinent negatives for diabetes include no chest pain and no polyuria. Symptoms are improving. There are no diabetic complications. Risk factors for coronary artery disease include dyslipidemia, hypertension and sedentary lifestyle. Current diabetic treatment includes insulin injections (she is on Tresiba 110 units qhs, Januvia '100mg'$  po qam, glipizide 5 mg qam.). Her weight is fluctuating minimally. She is following a generally unhealthy diet. When asked about meal planning, she reported none (Admits to dietary indiscretion. She drinks soda regularly.). She rarely participates in exercise. Her home blood glucose trend is decreasing steadily. Her breakfast blood glucose range is generally 140-180 mg/dl. Her overall blood glucose range is 180-200 mg/dl. (She presents with controlled fasting  glycemic profile.  Her point-of-care A1c is  8.7%. She did not document hypoglycemia.    )  Hypertension This is a chronic problem. The current episode started more than 1 year ago. Pertinent negatives include no chest pain, headaches, palpitations or shortness of breath. Risk factors for coronary artery disease include family history, dyslipidemia, sedentary lifestyle, diabetes mellitus and post-menopausal state. Past treatments include ACE inhibitors.  Hyperlipidemia This is a chronic problem. The current episode started more than 1 year ago. The problem is uncontrolled. Exacerbating diseases include diabetes and obesity. Pertinent negatives include no chest pain, myalgias or shortness of breath. Current antihyperlipidemic treatment includes statins. Risk  factors for coronary artery disease include diabetes mellitus, dyslipidemia, hypertension, obesity, post-menopausal, a sedentary lifestyle and family history.     Review of systems  Constitutional: + Minimally fluctuating body weight,  current  Body mass index is 33.71 kg/m. , no fatigue, no subjective hyperthermia, no subjective hypothermia     Objective:    BP (!) 120/56   Pulse 88   Ht '5\' 4"'$  (1.626 m)   Wt 196 lb 6.4 oz (89.1 kg)   BMI 33.71 kg/m   Wt Readings from Last 3 Encounters:  06/25/21 196 lb 6.4 oz (89.1 kg)  03/25/21 201 lb 12.8 oz (91.5 kg)  09/24/20 202 lb 9.6 oz (91.9 kg)     Results for orders placed or performed in visit on 03/25/21  HgB A1c  Result Value Ref Range   Hemoglobin A1C     HbA1c POC (<> result, manual entry)     HbA1c, POC (prediabetic range)     HbA1c, POC (controlled diabetic range) 8.7 (A) 0.0 - 7.0 %  Complete Blood Count (Most recent): Lab Results  Component Value Date   WBC 6.9 07/26/2016   HGB 12.7 07/26/2016   HCT 39.4 07/26/2016   MCV 85.5 07/26/2016   PLT 240 07/26/2016   Chemistry (most recent): Lab Results  Component Value Date   NA 141 09/15/2020   K 3.8 09/15/2020   CL 100 09/15/2020   CO2 26 09/15/2020   BUN 15 09/15/2020   CREATININE 0.66 09/15/2020   Diabetic Labs (most recent): Lab Results  Component Value Date   HGBA1C 8.7 (A) 03/25/2021   HGBA1C 8.2 (A) 06/16/2020   HGBA1C 8.4 (A) 03/03/2020   Lipid Panel     Component Value Date/Time   CHOL 190 03/18/2021 0851   TRIG 104 03/18/2021 0851   HDL 39 (L) 03/18/2021 0851   CHOLHDL 4.9 (H) 03/18/2021 0851   CHOLHDL 4.9 06/29/2019 0819   VLDL 36 (H) 08/04/2016 1157   LDLCALC 132 (H) 03/18/2021 0851   LDLCALC 142 (H) 06/29/2019 0819     Assessment & Plan:   1. Uncontrolled type 2 diabetes mellitus without complication, with long-term current use of insulin ([place])  She presents with controlled fasting  glycemic profile.  Her point-of-care A1c is   8.7%. She did not document hypoglycemia..   Recent labs reviewed. Patient remains at a high risk for more acute and chronic complications of diabetes which include CAD, CVA, CKD, retinopathy, and neuropathy. These are all discussed in detail with the patient. I have re-counseled the patient on diet management and weight loss, by adopting a carbohydrate restricted / protein rich  Diet. This patient will benefit from Lifestyle Medicine. - she acknowledges that there is a room for improvement in her food and drink choices. - Suggestion is made for her to avoid simple carbohydrates  from her diet including Cakes, Sweet Desserts, Ice Cream, Soda (diet and regular), Sweet Tea, Candies, Chips, Cookies, Store Bought Juices, Alcohol , Artificial Sweeteners,  Coffee Creamer, and "Sugar-free" Products, Lemonade. This will help patient to have more stable blood glucose profile and potentially avoid unintended weight gain.  The following Lifestyle Medicine recommendations according to [facility]) were discussed and and offered to patient and she  agrees to start the journey:  A. Whole Foods, Plant-Based Nutrition comprising of fruits and vegetables, plant-based proteins, whole-grain carbohydrates was discussed in detail with the patient.   A list for source of those nutrients were also provided to the patient.  Patient will use only water or unsweetened tea for hydration. B.  The need to stay away from risky substances including alcohol, smoking; obtaining 7 to 9 hours of restorative sleep, at least 150 minutes of moderate intensity exercise weekly, the importance of healthy social connections,  and stress management techniques were discussed. C.  A full color page of  Calorie density of various food groups per pound showing examples of each food groups was provided to the patient.   -In light of her presentation with  better and  target glycemic profile, she is advised to  decrease   Tresiba to 100 units nightly,   associated with monitoring of blood glucose at least twice a day-daily before breakfast and at bedtime. - Patient is warned not to take insulin without proper monitoring per orders.  -Patient is encouraged to call clinic for blood glucose levels less than 70 or above 200 mg /dl at fasting. -She is known to have intolerance to metformin.   -She is advised to  continue  Trulicity 1.5 mg [place] weekly. - she is advised to continue glipizide 5 mg XL p.o. daily at breakfast.     2) HTN: - Her BP is controlled to target. -She is advised to continue her amlodipine 5 mg p.o. daily, her lisinopril/HCTZ this morning.    3) HPL: She presents with still high LDL of 132 , only slightly better than 170 during last check. She took her Crestor intermittently. She is advised to be consistent with Crestor 20 mg p.o. nightly.   Plant predominant lifestyle nutrition discussed and recommended to her.   Side effects and precautions discussed with him.     4.  Weight management: Her BMI is  33.7 .  She is a candidate for modest weight loss.  I discussed the fact that-10% weight loss will help significantly impact on her diabetes care.  5)  Chronic Care: -Patient is on ACEI and Statin medications and encouraged to continue to follow up with Ophthalmology, Podiatrist at least yearly or according to recommendations, and advised to stay away from smoking. I have recommended yearly flu vaccine and pneumonia vaccination at least every 5 years; moderate intensity exercise for up to 150 minutes weekly; and  sleep for at least 7 hours a day.   Her screening ABI was negative for PAD in February 2022.  This study will be repeated in February 2027, or sooner if needed.    She is advised to maintain close follow-up with her PMD Catalina Antigua, MD    I spent 34 minutes in the care of the patient today including review of labs from [place], Lipids, Thyroid Function, Hematology (current  and previous including abstractions from other facilities); face-to-face time discussing  her blood glucose readings/logs, discussing hypoglycemia and hyperglycemia episodes and symptoms, medications doses, her options of short and long term treatment based on the latest standards of care / guidelines;  discussion about incorporating lifestyle medicine;  and documenting the encounter.    Please refer to Patient Instructions for Blood Glucose Monitoring and Insulin/Medications Dosing Guide"  in media tab for additional information. Please  also refer to " Patient Self Inventory" in the Media  tab for reviewed elements of pertinent patient history.  Rebecca Roberson participated in the discussions, expressed understanding, and voiced agreement with the above plans.  All questions were answered to her satisfaction. she is encouraged to contact clinic should she have any questions or concerns prior to her return visit.    Follow up plan: Return in about 4 months (around 10/25/2021) for F/U with Pre-visit Labs, Meter/CGM/Logs, A1c here.  Glade Lloyd, MD Phone: [phone]  Fax: [phone]  -  This note was partially dictated with voice recognition software. Similar sounding words can be transcribed inadequately or may not  be corrected upon review.  06/25/2021, 4:11 PM

## 2021-06-25 NOTE — Patient Instructions (Signed)

## 2021-08-06 ENCOUNTER — Other Ambulatory Visit: Payer: Self-pay

## 2021-08-06 MED ORDER — TRESIBA FLEXTOUCH 200 UNIT/ML ~~LOC~~ SOPN: 100.0000 [IU] | PEN_INJECTOR | Freq: Every day | SUBCUTANEOUS | 0 refills | Status: DC

## 2021-09-02 ENCOUNTER — Other Ambulatory Visit: Payer: Self-pay | Admitting: "Endocrinology

## 2021-09-22 ENCOUNTER — Other Ambulatory Visit: Payer: Self-pay | Admitting: "Endocrinology

## 2021-09-26 ENCOUNTER — Other Ambulatory Visit: Payer: Self-pay | Admitting: "Endocrinology

## 2021-10-20 ENCOUNTER — Other Ambulatory Visit: Payer: Self-pay

## 2021-10-20 DIAGNOSIS — E1165 Type 2 diabetes mellitus with hyperglycemia: Secondary | ICD-10-CM

## 2021-10-20 MED ORDER — TRESIBA FLEXTOUCH 200 UNIT/ML ~~LOC~~ SOPN: PEN_INJECTOR | SUBCUTANEOUS | 0 refills | Status: DC

## 2021-10-28 ENCOUNTER — Ambulatory Visit: Payer: BC Managed Care – PPO | Admitting: "Endocrinology

## 2021-11-13 LAB — COMPREHENSIVE METABOLIC PANEL
ALT: 20 IU/L (ref 0–32)
AST: 28 IU/L (ref 0–40)
Albumin/Globulin Ratio: 1.4 (ref 1.2–2.2)
Albumin: 4.2 g/dL (ref 3.9–4.9)
Alkaline Phosphatase: 67 IU/L (ref 44–121)
BUN/Creatinine Ratio: 17 (ref 12–28)
BUN: 15 mg/dL (ref 8–27)
Bilirubin Total: 0.3 mg/dL (ref 0.0–1.2)
CO2: 27 mmol/L (ref 20–29)
Calcium: 9.3 mg/dL (ref 8.7–10.3)
Chloride: 99 mmol/L (ref 96–106)
Creatinine, Ser: 0.86 mg/dL (ref 0.57–1.00)
Globulin, Total: 3 g/dL (ref 1.5–4.5)
Glucose: 95 mg/dL (ref 70–99)
Potassium: 3.9 mmol/L (ref 3.5–5.2)
Sodium: 140 mmol/L (ref 134–144)
Total Protein: 7.2 g/dL (ref 6.0–8.5)
eGFR: 73 mL/min/{1.73_m2} (ref >59)

## 2021-11-13 LAB — LIPID PANEL
Chol/HDL Ratio: 7.8 ratio — ABNORMAL HIGH (ref 0.0–4.4)
Cholesterol, Total: 297 mg/dL — ABNORMAL HIGH (ref 100–199)
HDL: 38 mg/dL — ABNORMAL LOW (ref >39)
LDL Chol Calc (NIH): 235 mg/dL — ABNORMAL HIGH (ref 0–99)
Triglycerides: 130 mg/dL (ref 0–149)
VLDL Cholesterol Cal: 24 mg/dL (ref 5–40)

## 2021-11-19 ENCOUNTER — Encounter: Payer: Self-pay | Admitting: "Endocrinology

## 2021-11-19 ENCOUNTER — Ambulatory Visit: Payer: BC Managed Care – PPO | Admitting: "Endocrinology

## 2021-11-19 VITALS — BP 126/64 | HR 88 | Ht 64.0 in | Wt 193.4 lb

## 2021-11-19 DIAGNOSIS — I10 Essential (primary) hypertension: Secondary | ICD-10-CM | POA: Diagnosis not present

## 2021-11-19 DIAGNOSIS — E1165 Type 2 diabetes mellitus with hyperglycemia: Secondary | ICD-10-CM | POA: Diagnosis not present

## 2021-11-19 DIAGNOSIS — E6609 Other obesity due to excess calories: Secondary | ICD-10-CM

## 2021-11-19 DIAGNOSIS — Z6833 Body mass index (BMI) 33.0-33.9, adult: Secondary | ICD-10-CM

## 2021-11-19 DIAGNOSIS — E782 Mixed hyperlipidemia: Secondary | ICD-10-CM | POA: Diagnosis not present

## 2021-11-19 LAB — POCT GLYCOSYLATED HEMOGLOBIN (HGB A1C): HbA1c, POC (controlled diabetic range): 6.7 % (ref 0.0–7.0)

## 2021-11-19 MED ORDER — ROSUVASTATIN CALCIUM 20 MG PO TABS
20.0000 mg | ORAL_TABLET | ORAL | 1 refills | Status: DC
Start: 2021-11-19 — End: 2022-04-05

## 2021-11-19 MED ORDER — TRESIBA FLEXTOUCH 200 UNIT/ML ~~LOC~~ SOPN: 80.0000 [IU] | PEN_INJECTOR | Freq: Every day | SUBCUTANEOUS | 1 refills | Status: DC

## 2021-11-19 NOTE — Progress Notes (Signed)
11/19/2021   Endocrinology follow-up note    Subjective:    Patient ID: Rebecca Roberson, female    DOB: 1951-01-16,    Past Medical History:  Diagnosis Date   Arthritis    Cervical myelopathy ([place]) 06/30/2016   Diabetes mellitus without complication (HCC)    GERD (gastroesophageal reflux disease)    Heart murmur    History of kidney stones    has one now   Hyperlipidemia    Hypertension    Sleep apnea    cpap have not used 3 yrs   Past Surgical History:  Procedure Laterality Date   ANTERIOR CERVICAL DECOMP/DISCECTOMY FUSION N/A 07/30/2016   Procedure: ANTERIOR CERVICAL DECOMPRESSION/DISCECTOMY FUSION CERVICAL TWO-THREE;  Surgeon: Ashok Pall, MD;  Location: [facility];  Service: Neurosurgery;  Laterality: N/A;  ANTERIOR CERVICAL DECOMPRESSION/DISCECTOMY FUSION CERVICAL 2- CERVICAL 3   CERVICAL CONE BIOPSY     Social History   Socioeconomic History   Marital status: Single    Spouse name: Not on file   Number of children: 0   Years of education: 14   Highest education level: Not on file  Occupational History   Occupation: [employer]  Tobacco Use   Smoking status: Never   Smokeless tobacco: Never  Vaping Use   Vaping Use: Never used  Substance and Sexual Activity   Alcohol use: No    Alcohol/week: 0.0 standard drinks of alcohol   Drug use: No   Sexual activity: Not on file  Other Topics Concern   Not on file  Social History Narrative   Lives alone   Caffeine use: Drinks soda (20oz per day)   Tea sometimes   Right-handed   Social Determinants of Health   Financial Resource Strain: Not on file  Food Insecurity: Not on file  Transportation Needs: Not on file  Physical Activity: Not on file  Stress: Not on file  Social Connections: Not on file   Outpatient Encounter Medications as of 11/19/2021  Medication Sig   amLODipine (NORVASC) 5 MG tablet Take by mouth daily.   aspirin EC 81 MG tablet Take 81 mg by mouth daily.   cyclobenzaprine (FLEXERIL)  10 MG tablet Take 10 mg by mouth at bedtime as needed for muscle spasms.    Dulaglutide (TRULICITY) 1.5 XT/0.2IO SOPN Inject 1.5 mg into the skin once a week.   EASY COMFORT PEN NEEDLES 31G X 8 MM MISC USE AS DIRECTED DAILY.   fluticasone (FLONASE) 50 MCG/ACT nasal spray Place 2 sprays into both nostrils daily.   gabapentin (NEURONTIN) 300 MG capsule Take 300 mg by mouth as needed.    glipiZIDE (GLUCOTROL XL) 5 MG 24 hr tablet TAKE ONE TABLET BY MOUTH DAILY WITH BREAKFAST   ibuprofen (ADVIL,MOTRIN) 800 MG tablet Take 800 mg by mouth 3 (three) times daily as needed for moderate pain.    insulin degludec (TRESIBA FLEXTOUCH) 200 UNIT/ML FlexTouch Pen Inject 80 Units into the skin at bedtime. INJECT 80 UNITS SUBCUTANEOUSLY AT BEDTIME   lisinopril-hydrochlorothiazide (PRINZIDE,ZESTORETIC) 20-25 MG per tablet Take 1 tablet by mouth daily.   omeprazole (PRILOSEC) 20 MG capsule Take 20 mg by mouth daily.   ONETOUCH VERIO test strip USE TO CHECK BLOOD SUGAR TWICE DAILY.   PARoxetine (PAXIL) 20 MG tablet Take 20 mg by mouth daily.   potassium chloride (K-DUR,KLOR-CON) 10 MEQ tablet Take 10 mEq by mouth daily.   rosuvastatin (CRESTOR) 20 MG tablet Take 1 tablet (20 mg total) by mouth every other day.   solifenacin (  VESICARE) 5 MG tablet Take 5 mg by mouth daily.   tolterodine (DETROL LA) 4 MG 24 hr capsule Take 4 mg by mouth daily.   traMADol (ULTRAM) 50 MG tablet Take 2 tablets (100 mg total) by mouth every 6 (six) hours as needed for moderate pain.   Vitamin D, Ergocalciferol, (DRISDOL) 50000 UNITS CAPS capsule Take 50,000 Units by mouth every Monday.    [DISCONTINUED] insulin degludec (TRESIBA FLEXTOUCH) 200 UNIT/ML FlexTouch Pen INJECT 100 UNITS SUBCUTANEOUSLY AT BEDTIME (Patient taking differently: 80 Units at bedtime. INJECT 80 UNITS SUBCUTANEOUSLY AT BEDTIME)   [DISCONTINUED] rosuvastatin (CRESTOR) 20 MG tablet TAKE ONE TABLET BY MOUTH ONCE DAILY (Patient not taking: Reported on 11/19/2021)   No  facility-administered encounter medications on file as of 11/19/2021.   ALLERGIES: Allergies  Allergen Reactions   Metformin Diarrhea   Statins Other (See Comments)    Weak all over , muscle weakness    Sulfa Antibiotics Rash and Other (See Comments)    Gets a fever    Sulfasalazine Other (See Comments) and Rash    Gets a fever    VACCINATION STATUS: Immunization History  Administered Date(s) Administered   Moderna Sars-Covid-2 Vaccination 08/05/2020    Diabetes She presents for her follow-up diabetic visit. She has type 2 diabetes mellitus. Onset time: Diagnosed at approximate age of 13 yrs. Her disease course has been improving. There are no hypoglycemic associated symptoms. Pertinent negatives for hypoglycemia include no confusion, headaches, pallor or seizures. There are no diabetic associated symptoms. Pertinent negatives for diabetes include no chest pain and no polyuria. Symptoms are improving. There are no diabetic complications. Risk factors for coronary artery disease include dyslipidemia, hypertension and sedentary lifestyle. Current diabetic treatment includes insulin injections (she is on Tresiba 110 units qhs, Januvia '100mg'$  po qam, glipizide 5 mg qam.). Her weight is decreasing steadily. She is following a generally unhealthy diet. When asked about meal planning, she reported none (Admits to dietary indiscretion. She drinks soda regularly.). She rarely participates in exercise. Her home blood glucose trend is decreasing steadily. Her breakfast blood glucose range is generally 130-140 mg/dl. Her overall blood glucose range is 140-180 mg/dl. (She presents with controlled fasting  glycemic profile.  Her point-of-care A1c is 6.7%, improving from 8.7%.  She did not document any hypoglycemia.      )  Hypertension This is a chronic problem. The current episode started more than 1 year ago. Pertinent negatives include no chest pain, headaches, palpitations or shortness of breath. Risk  factors for coronary artery disease include family history, dyslipidemia, sedentary lifestyle, diabetes mellitus and post-menopausal state. Past treatments include ACE inhibitors.  Hyperlipidemia This is a chronic problem. The current episode started more than 1 year ago. The problem is uncontrolled. Exacerbating diseases include diabetes and obesity. Pertinent negatives include no chest pain, myalgias or shortness of breath. Current antihyperlipidemic treatment includes statins. Risk factors for coronary artery disease include diabetes mellitus, dyslipidemia, hypertension, obesity, post-menopausal, a sedentary lifestyle and family history.     Review of systems  Constitutional: + Minimally fluctuating body weight,  current  Body mass index is 33.2 kg/m. , no fatigue, no subjective hyperthermia, no subjective hypothermia    Objective:    BP 126/64   Pulse 88   Ht '5\' 4"'$  (1.626 m)   Wt 193 lb 6.4 oz (87.7 kg)   BMI 33.20 kg/m   Wt Readings from Last 3 Encounters:  11/19/21 193 lb 6.4 oz (87.7 kg)  06/25/21 196 lb 6.4 oz (  89.1 kg)  03/25/21 201 lb 12.8 oz (91.5 kg)     Results for orders placed or performed in visit on 11/19/21  HgB A1c  Result Value Ref Range   Hemoglobin A1C     HbA1c POC (<> result, manual entry)     HbA1c, POC (prediabetic range)     HbA1c, POC (controlled diabetic range) 6.7 0.0 - 7.0 %   Complete Blood Count (Most recent): Lab Results  Component Value Date   WBC 6.9 07/26/2016   HGB 12.7 07/26/2016   HCT 39.4 07/26/2016   MCV 85.5 07/26/2016   PLT 240 07/26/2016   Chemistry (most recent): Lab Results  Component Value Date   NA 140 11/12/2021   K 3.9 11/12/2021   CL 99 11/12/2021   CO2 27 11/12/2021   BUN 15 11/12/2021   CREATININE 0.86 11/12/2021   Diabetic Labs (most recent): Lab Results  Component Value Date   HGBA1C 6.7 11/19/2021   HGBA1C 7.2 (A) 06/25/2021   HGBA1C 8.7 (A) 03/25/2021   MICROALBUR 0.9 06/29/2019   MICROALBUR 11  09/28/2017   MICROALBUR 1.2 08/04/2016   Lipid Panel     Component Value Date/Time   CHOL 297 (H) 11/12/2021 0911   TRIG 130 11/12/2021 0911   HDL 38 (L) 11/12/2021 0911   CHOLHDL 7.8 (H) 11/12/2021 0911   CHOLHDL 4.9 06/29/2019 0819   VLDL 36 (H) 08/04/2016 1157   LDLCALC 235 (H) 11/12/2021 0911   LDLCALC 142 (H) 06/29/2019 0819     Assessment & Plan:   1. Uncontrolled type 2 diabetes mellitus without complication, with long-term current use of insulin ([place])  She presents with controlled fasting  glycemic profile.  Her point-of-care A1c is  6.7%. She did not document hypoglycemia..   Recent labs reviewed. Patient remains at a high risk for more acute and chronic complications of diabetes which include CAD, CVA, CKD, retinopathy, and neuropathy. These are all discussed in detail with the patient. I have re-counseled the patient on diet management and weight loss, by adopting a carbohydrate restricted / protein rich  Diet. This patient will benefit from Lifestyle Medicine, especially given her currently uncontrolled dyslipidemia. - she acknowledges that there is a room for improvement in her food and drink choices. - Suggestion is made for her to avoid simple carbohydrates  from her diet including Cakes, Sweet Desserts, Ice Cream, Soda (diet and regular), Sweet Tea, Candies, Chips, Cookies, Store Bought Juices, Alcohol , Artificial Sweeteners,  Coffee Creamer, and "Sugar-free" Products, Lemonade. This will help patient to have more stable blood glucose profile and potentially avoid unintended weight gain.  The following Lifestyle Medicine recommendations according to [facility]) were discussed and and offered to patient and she  agrees to start the journey:  A. Whole Foods, Plant-Based Nutrition comprising of fruits and vegetables, plant-based proteins, whole-grain carbohydrates was discussed in detail with the patient.   A list for source of those  nutrients were also provided to the patient.  Patient will use only water or unsweetened tea for hydration. B.  The need to stay away from risky substances including alcohol, smoking; obtaining 7 to 9 hours of restorative sleep, at least 150 minutes of moderate intensity exercise weekly, the importance of healthy social connections,  and stress management techniques were discussed. C.  A full color page of  Calorie density of various food groups per pound showing examples of each food groups was provided to the patient.   -  In light of her presentation with  better and  target glycemic profile, she is advised to crease [place] to 80 units nightly, advised to continue Trulicity 1.5 mg subcutaneously weekly, advised to continue glipizide 5 mg once a day at breakfast.    -She is advised to continue monitoring of blood glucose at least twice a day-daily before breakfast and at bedtime. - Patient is warned not to take insulin without proper monitoring per orders.  -Patient is encouraged to call clinic for blood glucose levels less than 70 or above 200 mg /dl at fasting. -She is known to have intolerance to metformin.    2) HTN: - Her BP is controlled to target. -She is advised to continue her amlodipine 5 mg p.o. daily, her lisinopril/HCTZ this morning.    3) HPL: She presents with full control of LDL to 235, increasing from 132.  -This is mainly due to the fact that she was advised to discontinue her statin due to joint aches.  However this patient would like to try again.  I advised her to take Crestor 20 mg p.o. every other day.  She will also benefit from whole food plant-based diet discussed above.   4.  Weight management: Her BMI is 33.2-  She is a candidate for modest weight loss.  I discussed the fact that-10% weight loss will help significantly impact on her diabetes care.  5)  Chronic Care: -Patient is on ACEI and Statin medications and encouraged to continue to follow up with  Ophthalmology, Podiatrist at least yearly or according to recommendations, and advised to stay away from smoking. I have recommended yearly flu vaccine and pneumonia vaccination at least every 5 years; moderate intensity exercise for up to 150 minutes weekly; and  sleep for at least 7 hours a day.   Her screening ABI was negative for PAD in February 2022.  This study will be repeated in February 2027, or sooner if needed.    She is advised to maintain close follow-up with her PMD Catalina [place], MD   I spent 33 minutes in the care of the patient today including review of labs from [place], Lipids, Thyroid Function, Hematology (current and previous including abstractions from other facilities); face-to-face time discussing  her blood glucose readings/logs, discussing hypoglycemia and hyperglycemia episodes and symptoms, medications doses, her options of short and long term treatment based on the latest standards of care / guidelines;  discussion about incorporating lifestyle medicine;  and documenting the encounter. Risk reduction counseling performed per USPSTF guidelines to reduce  obesity and cardiovascular risk factors.     Please refer to Patient Instructions for Blood Glucose Monitoring and Insulin/Medications Dosing Guide"  in media tab for additional information. Please  also refer to " Patient Self Inventory" in the Media  tab for reviewed elements of pertinent patient history.  Rebecca Roberson participated in the discussions, expressed understanding, and voiced agreement with the above plans.  All questions were answered to her satisfaction. she is encouraged to contact clinic should she have any questions or concerns prior to her return visit.  Follow up plan: Return in about 3 months (around 02/19/2022) for F/U with Pre-visit Labs, Meter/CGM/Logs, A1c here.  Glade Lloyd, MD Phone: [phone]  Fax: [phone]  -  This note was partially dictated with voice recognition software. Similar  sounding words can be transcribed inadequately or may not  be corrected upon review.  11/19/2021, 7:58 PM

## 2021-11-19 NOTE — Patient Instructions (Signed)

## 2021-12-08 ENCOUNTER — Other Ambulatory Visit: Payer: Self-pay

## 2021-12-08 ENCOUNTER — Ambulatory Visit (HOSPITAL_COMMUNITY)
Admission: RE | Admit: 2021-12-08 | Discharge: 2021-12-08 | Disposition: A | Discharge status: Home / Self Care | Payer: BC Managed Care – PPO | Source: Ambulatory Visit | Attending: Urology | Admitting: Urology

## 2021-12-08 DIAGNOSIS — N200 Calculus of kidney: Secondary | ICD-10-CM | POA: Insufficient documentation

## 2021-12-10 ENCOUNTER — Ambulatory Visit: Payer: BC Managed Care – PPO | Admitting: Urology

## 2021-12-10 ENCOUNTER — Encounter: Payer: Self-pay | Admitting: Urology

## 2021-12-10 VITALS — BP 122/79 | HR 87

## 2021-12-10 DIAGNOSIS — N3941 Urge incontinence: Secondary | ICD-10-CM

## 2021-12-10 DIAGNOSIS — N3281 Overactive bladder: Secondary | ICD-10-CM

## 2021-12-10 DIAGNOSIS — N200 Calculus of kidney: Secondary | ICD-10-CM | POA: Diagnosis not present

## 2021-12-10 MED ORDER — MIRABEGRON ER 50 MG PO TB24: 50.0000 mg | ORAL_TABLET | Freq: Every day | ORAL | 0 refills | Status: DC

## 2021-12-10 NOTE — Progress Notes (Signed)
Subjective:  1. Calculus, renal   2. Urge incontinence     I have kidney stones. HPI: Rebecca Roberson is a 71 year-old female established patient who is here for renal calculi.   Rebecca Roberson returns today in f/u for her history of stones. She had a KUB on 08/28/19 and was found to have a stable small RUP stone.  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    02/25/2017: No stone events since last visit. KUB shows stable 30m right upper pole calculus.   12/04/20: [place] today in f/u for her history of stones.   She has had no worrisome symptoms and her UA is unremarkable.  She last had a KUB in 8/21 and had a stable 524mRUP stone.  [place] has OAB [place] has been on Detrol LA '4mg'$  for some time and is currently on solifenacin '5mg'$  daily which has helped her nocturia and incontinence.   The Myrbetriq and GeLogan [place] too expensive.    12/10/21: Rebecca Roberson today in f/u for her history of stones and OAB wet.   A KUB on 11/14 shows a stable 62m71mUP stone.  She has been on tolteradine and solifenacin for the OAB.  She has been having some pain in the LLQ for the last 3 weeks.   She is very constipated.  She has had no hematuria.  She had some diverticuli on a CT in 2016.  She has no fever.       ROS:  ROS:  A complete review of systems was performed.  All systems are negative except for pertinent findings as noted.   Review of Systems  Constitutional:  Positive for malaise/fatigue and weight loss.  HENT:  Positive for congestion.   Gastrointestinal:  Positive for constipation and heartburn.  Musculoskeletal:  Positive for back pain and joint pain.  Neurological:  Positive for dizziness.  Psychiatric/Behavioral:  Positive for depression. The patient is nervous/anxious.     Allergies  Allergen Reactions   Metformin Diarrhea   Statins Other (See Comments)    Weak all over , muscle weakness    Sulfa Antibiotics Rash and Other (See Comments)    Gets a  fever    Sulfasalazine Other (See Comments) and Rash    Gets a fever     Outpatient Encounter Medications as of 12/10/2021  Medication Sig Note   aspirin EC 81 MG tablet Take 81 mg by mouth daily.    cyclobenzaprine (FLEXERIL) 10 MG tablet Take 10 mg by mouth at bedtime as needed for muscle spasms.     Dulaglutide (TRULICITY) 1.5 MG/BD/5.3GDPN Inject 1.5 mg into the skin once a week.    EASY COMFORT PEN NEEDLES 31G X 8 MM MISC USE AS DIRECTED DAILY.    fluticasone (FLONASE) 50 MCG/ACT nasal spray Place 2 sprays into both nostrils daily.    gabapentin (NEURONTIN) 300 MG capsule Take 300 mg by mouth as needed.     glipiZIDE (GLUCOTROL XL) 5 MG 24 hr tablet TAKE ONE TABLET BY MOUTH DAILY WITH BREAKFAST    ibuprofen (ADVIL,MOTRIN) 800 MG tablet Take 800 mg by mouth 3 (three) times daily as needed for moderate pain.     insulin degludec (TRESIBA FLEXTOUCH) 200 UNIT/ML FlexTouch Pen Inject 80 Units into the skin at bedtime. INJECT 80 UNITS SUBCUTANEOUSLY AT BEDTIME    lisinopril-hydrochlorothiazide (PRINZIDE,ZESTORETIC) 20-25 MG per tablet Take 1 tablet by mouth daily.    mirabegron ER (  MYRBETRIQ) 50 MG TB24 tablet Take 1 tablet (50 mg total) by mouth daily.    omeprazole (PRILOSEC) 20 MG capsule Take 20 mg by mouth daily.    ONETOUCH VERIO test strip USE TO CHECK BLOOD SUGAR TWICE DAILY.    PARoxetine (PAXIL) 20 MG tablet Take 20 mg by mouth daily.    potassium chloride (K-DUR,KLOR-CON) 10 MEQ tablet Take 10 mEq by mouth daily.    rosuvastatin (CRESTOR) 20 MG tablet Take 1 tablet (20 mg total) by mouth every other day.    traMADol (ULTRAM) 50 MG tablet Take 2 tablets (100 mg total) by mouth every 6 (six) hours as needed for moderate pain.    Vitamin D, Ergocalciferol, (DRISDOL) 50000 UNITS CAPS capsule Take 50,000 Units by mouth every Monday.     [DISCONTINUED] solifenacin (VESICARE) 5 MG tablet Take 5 mg by mouth daily. 12/10/2021: constipation   [DISCONTINUED] tolterodine (DETROL LA) 4 MG 24  hr capsule Take 4 mg by mouth daily. 12/10/2021: constipation   amLODipine (NORVASC) 5 MG tablet Take by mouth daily.    No facility-administered encounter medications on file as of 12/10/2021.    Past Medical History:  Diagnosis Date   Arthritis    Cervical myelopathy ([place]) 06/30/2016   Diabetes mellitus without complication (HCC)    GERD (gastroesophageal reflux disease)    Heart murmur    History of kidney stones    has one now   Hyperlipidemia    Hypertension    Sleep apnea    cpap have not used 3 yrs    Past Surgical History:  Procedure Laterality Date   ANTERIOR CERVICAL DECOMP/DISCECTOMY FUSION N/A 07/30/2016   Procedure: ANTERIOR CERVICAL DECOMPRESSION/DISCECTOMY FUSION CERVICAL TWO-THREE;  Surgeon: Ashok Pall, MD;  Location: [facility];  Service: Neurosurgery;  Laterality: N/A;  ANTERIOR CERVICAL DECOMPRESSION/DISCECTOMY FUSION CERVICAL 2- CERVICAL 3   CERVICAL CONE BIOPSY      Social History   Socioeconomic History   Marital status: Single    Spouse name: Not on file   Number of children: 0   Years of education: 14   Highest education level: Not on file  Occupational History   Occupation: Rebecca Roberson  Tobacco Use   Smoking status: Never   Smokeless tobacco: Never  Vaping Use   Vaping Use: Never used  Substance and Sexual Activity   Alcohol use: No    Alcohol/week: 0.0 standard drinks of alcohol   Drug use: No   Sexual activity: Not on file  Other Topics Concern   Not on file  Social History Narrative   Lives alone   Caffeine use: Drinks soda (20oz per day)   Tea sometimes   Right-handed   Social Determinants of Health   Financial Resource Strain: Not on file  Food Insecurity: Not on file  Transportation Needs: Not on file  Physical Activity: Not on file  Stress: Not on file  Social Connections: Not on file  Intimate Partner Violence: Not on file    Family History  Problem Relation Age of Onset   Kidney disease Father    Thyroid disease  Father        Objective: Vitals:   12/10/21 1541  BP: 122/79  Pulse: 87      Physical Exam Constitutional:      Appearance: Normal appearance.  Abdominal:     Palpations: Abdomen is soft.     Tenderness: There is abdominal tenderness (in the LLQ without peritoneal signs.).  Neurological:     Mental  Status: She is alert.     Lab Results:  Results for orders placed or performed in visit on 12/10/21 (from the past 24 hour(s))  Urinalysis, Routine w reflex microscopic     Status: Abnormal   Collection Time: 12/10/21  4:18 PM  Result Value Ref Range   Specific Gravity, UA 1.025 1.005 - 1.030   pH, UA 6.0 5.0 - 7.5   Color, UA Yellow Yellow   Appearance Ur Hazy (A) Clear   Leukocytes,UA Negative Negative   Protein,UA 2+ (A) Negative/Trace   Glucose, UA Negative Negative   Ketones, UA Trace (A) Negative   RBC, UA Negative Negative   Bilirubin, UA Negative Negative   Urobilinogen, Ur 4.0 (H) 0.2 - 1.0 mg/dL   Nitrite, UA Negative Negative   Microscopic Examination See below:    Narrative   Performed at:  [facility] [address] [place], [place]  [id number] Lab Director: Mina Marble MT, Phone:  [phone]  Microscopic Examination     Status: Abnormal   Collection Time: 12/10/21  4:18 PM   Urine  Result Value Ref Range   WBC, UA 0-5 0 - 5 /hpf   RBC, Urine 0-2 0 - 2 /hpf   Epithelial Cells (non renal) 0-10 0 - 10 /hpf   Casts Present (A) None seen /lpf   Cast Type Hyaline casts N/A   Mucus, UA Present (A) Not Estab.   Bacteria, UA Few None seen/Few   Narrative   Performed at:  [facility] [address], [place], [place]  [id number] Lab Director: Kimberly, Phone:  [phone]      BMET No results for input(s): "NA", "K", "CL", "CO2", "GLUCOSE", "BUN", "CREATININE", "CALCIUM" in the last 72 hours. PSA No results found for: "PSA" No results found for: "TESTOSTERONE"  UA is unremarkable.   Studies/Results: DG  Abd 1 View  Result Date: 12/10/2021 CLINICAL DATA:  Kidney Stone, left pain EXAM: ABDOMEN - 1 VIEW COMPARISON:  12/08/2020 FINDINGS: 5 mm calcification projects over the upper pole right renal collecting system. No definite ureteral calculus. Cluster of calcifications in the central pelvis probably degenerated fibroid, stable since previous. Normal bowel gas pattern. Mild spondylitic changes in the lower lumbar spine. IMPRESSION: 5 mm right renal calculus, stable. Electronically Signed   By: Lucrezia Europe M.D.   On: 12/10/2021 10:52       Assessment & Plan: Right upper pole renal stone.  The stone is stable.    OAB wet doing well on tolteradine with solifenacin '5mg'$  but she has LLQ pain with constipation.   I will stop those two meds and try her on Myrbetriq '50mg'$  again. .      Meds ordered this encounter  Medications   mirabegron ER (MYRBETRIQ) 50 MG TB24 tablet    Sig: Take 1 tablet (50 mg total) by mouth daily.    Dispense:  56 tablet    Refill:  0     Orders Placed This Encounter  Procedures   Microscopic Examination   Urinalysis, Routine w reflex microscopic      Return in about 8 weeks (around 02/04/2022).   CC: Leeanne Rio, MD      Irine Seal 12/11/2021 Patient ID: Rebecca Roberson, female   DOB: 31-Oct-1950, 71 y.o.   MRN: [id number]

## 2021-12-11 LAB — URINALYSIS, ROUTINE W REFLEX MICROSCOPIC
Bilirubin, UA: NEGATIVE
Glucose, UA: NEGATIVE
Leukocytes,UA: NEGATIVE
Nitrite, UA: NEGATIVE
RBC, UA: NEGATIVE
Specific Gravity, UA: 1.025 (ref 1.005–1.030)
Urobilinogen, Ur: 4 mg/dL — ABNORMAL HIGH (ref 0.2–1.0)
pH, UA: 6 (ref 5.0–7.5)

## 2021-12-11 LAB — MICROSCOPIC EXAMINATION

## 2021-12-25 ENCOUNTER — Other Ambulatory Visit: Payer: Self-pay | Admitting: "Endocrinology

## 2022-01-07 ENCOUNTER — Ambulatory Visit: Payer: BC Managed Care – PPO | Admitting: Urology

## 2022-02-18 LAB — LIPID PANEL
Chol/HDL Ratio: 4 ratio (ref 0.0–4.4)
Cholesterol, Total: 163 mg/dL (ref 100–199)
HDL: 41 mg/dL (ref >39)
LDL Chol Calc (NIH): 105 mg/dL — ABNORMAL HIGH (ref 0–99)
Triglycerides: 92 mg/dL (ref 0–149)
VLDL Cholesterol Cal: 17 mg/dL (ref 5–40)

## 2022-02-24 ENCOUNTER — Ambulatory Visit: Payer: BC Managed Care – PPO | Admitting: "Endocrinology

## 2022-02-24 ENCOUNTER — Encounter: Payer: Self-pay | Admitting: "Endocrinology

## 2022-02-24 VITALS — BP 108/58 | HR 84 | Ht 64.0 in | Wt 193.6 lb

## 2022-02-24 DIAGNOSIS — E6609 Other obesity due to excess calories: Secondary | ICD-10-CM

## 2022-02-24 DIAGNOSIS — Z6833 Body mass index (BMI) 33.0-33.9, adult: Secondary | ICD-10-CM

## 2022-02-24 DIAGNOSIS — E1165 Type 2 diabetes mellitus with hyperglycemia: Secondary | ICD-10-CM | POA: Diagnosis not present

## 2022-02-24 DIAGNOSIS — E782 Mixed hyperlipidemia: Secondary | ICD-10-CM

## 2022-02-24 DIAGNOSIS — I10 Essential (primary) hypertension: Secondary | ICD-10-CM | POA: Diagnosis not present

## 2022-02-24 LAB — POCT GLYCOSYLATED HEMOGLOBIN (HGB A1C): HbA1c, POC (controlled diabetic range): 7.1 % — AB (ref 0.0–7.0)

## 2022-02-24 NOTE — Progress Notes (Signed)
02/24/2022   Endocrinology follow-up note    Subjective:    Patient ID: Rebecca Roberson, female    DOB: 07-Mar-1950,    Past Medical History:  Diagnosis Date   Arthritis    Cervical myelopathy ([place]) 06/30/2016   Diabetes mellitus without complication (HCC)    GERD (gastroesophageal reflux disease)    Heart murmur    History of kidney stones    has one now   Hyperlipidemia    Hypertension    Sleep apnea    cpap have not used 3 yrs   Past Surgical History:  Procedure Laterality Date   ANTERIOR CERVICAL DECOMP/DISCECTOMY FUSION N/A 07/30/2016   Procedure: ANTERIOR CERVICAL DECOMPRESSION/DISCECTOMY FUSION CERVICAL TWO-THREE;  Surgeon: Ashok Pall, MD;  Location: [place];  Service: Neurosurgery;  Laterality: N/A;  ANTERIOR CERVICAL DECOMPRESSION/DISCECTOMY FUSION CERVICAL 2- CERVICAL 3   CERVICAL CONE BIOPSY     Social History   Socioeconomic History   Marital status: Single    Spouse name: Not on file   Number of children: 0   Years of education: 14   Highest education level: Not on file  Occupational History   Occupation: [employer]  Tobacco Use   Smoking status: Never   Smokeless tobacco: Never  Vaping Use   Vaping Use: Never used  Substance and Sexual Activity   Alcohol use: No    Alcohol/week: 0.0 standard drinks of alcohol   Drug use: No   Sexual activity: Not on file  Other Topics Concern   Not on file  Social History Narrative   Lives alone   Caffeine use: Drinks soda (20oz per day)   Tea sometimes   Right-handed   Social Determinants of Health   Financial Resource Strain: Not on file  Food Insecurity: Not on file  Transportation Needs: Not on file  Physical Activity: Not on file  Stress: Not on file  Social Connections: Not on file   Outpatient Encounter Medications as of 02/24/2022  Medication Sig   amLODipine (NORVASC) 5 MG tablet Take by mouth daily.   aspirin EC 81 MG tablet Take 81 mg by mouth daily.   cyclobenzaprine (FLEXERIL) 10  MG tablet Take 10 mg by mouth at bedtime as needed for muscle spasms.    EASY COMFORT PEN NEEDLES 31G X 8 MM MISC USE AS DIRECTED DAILY.   fluticasone (FLONASE) 50 MCG/ACT nasal spray Place 2 sprays into both nostrils daily.   gabapentin (NEURONTIN) 300 MG capsule Take 300 mg by mouth as needed.    glipiZIDE (GLUCOTROL XL) 5 MG 24 hr tablet TAKE ONE TABLET BY MOUTH DAILY WITH BREAKFAST   ibuprofen (ADVIL,MOTRIN) 800 MG tablet Take 800 mg by mouth 3 (three) times daily as needed for moderate pain.    insulin degludec (TRESIBA FLEXTOUCH) 200 UNIT/ML FlexTouch Pen Inject 80 Units into the skin at bedtime. INJECT 80 UNITS SUBCUTANEOUSLY AT BEDTIME   lisinopril-hydrochlorothiazide (PRINZIDE,ZESTORETIC) 20-25 MG per tablet Take 1 tablet by mouth daily.   mirabegron ER (MYRBETRIQ) 50 MG TB24 tablet Take 1 tablet (50 mg total) by mouth daily.   omeprazole (PRILOSEC) 20 MG capsule Take 20 mg by mouth daily.   ONETOUCH VERIO test strip USE TO CHECK BLOOD SUGAR TWICE DAILY.   PARoxetine (PAXIL) 20 MG tablet Take 20 mg by mouth daily.   potassium chloride (K-DUR,KLOR-CON) 10 MEQ tablet Take 10 mEq by mouth daily.   rosuvastatin (CRESTOR) 20 MG tablet Take 1 tablet (20 mg total) by mouth every other day.  traMADol (ULTRAM) 50 MG tablet Take 2 tablets (100 mg total) by mouth every 6 (six) hours as needed for moderate pain.   TRULICITY 1.5 IF/0.2DX SOPN INJECT 1.5 MG SUBCUTANEOUSLY ONCE A WEEK   Vitamin D, Ergocalciferol, (DRISDOL) 50000 UNITS CAPS capsule Take 50,000 Units by mouth every Monday.    No facility-administered encounter medications on file as of 02/24/2022.   ALLERGIES: Allergies  Allergen Reactions   Metformin Diarrhea   Statins Other (See Comments)    Weak all over , muscle weakness    Sulfa Antibiotics Rash and Other (See Comments)    Gets a fever    Sulfasalazine Other (See Comments) and Rash    Gets a fever    VACCINATION STATUS: Immunization History  Administered Date(s)  Administered   Moderna Sars-Covid-2 Vaccination 08/05/2020    Diabetes She presents for her follow-up diabetic visit. She has type 2 diabetes mellitus. Onset time: Diagnosed at approximate age of 33 yrs. Her disease course has been improving. There are no hypoglycemic associated symptoms. Pertinent negatives for hypoglycemia include no confusion, headaches, pallor or seizures. There are no diabetic associated symptoms. Pertinent negatives for diabetes include no chest pain and no polyuria. Symptoms are improving. There are no diabetic complications. Risk factors for coronary artery disease include dyslipidemia, hypertension and sedentary lifestyle. Current diabetic treatment includes insulin injections (she is on Tresiba 110 units qhs, Januvia '100mg'$  po qam, glipizide 5 mg qam.). Her weight is decreasing steadily. She is following a generally unhealthy diet. When asked about meal planning, she reported none (Admits to dietary indiscretion. She drinks soda regularly.). She rarely participates in exercise. Her home blood glucose trend is decreasing steadily. Her breakfast blood glucose range is generally 130-140 mg/dl. Her overall blood glucose range is 130-140 mg/dl. (She presents with controlled fasting  glycemic profile.  Her point-of-care A1c is 7.1%, improving from 8.7%.  She did not document any hypoglycemia.      )  Hypertension This is a chronic problem. The current episode started more than 1 year ago. Pertinent negatives include no chest pain, headaches, palpitations or shortness of breath. Risk factors for coronary artery disease include family history, dyslipidemia, sedentary lifestyle, diabetes mellitus and post-menopausal state. Past treatments include ACE inhibitors.  Hyperlipidemia This is a chronic problem. The current episode started more than 1 year ago. The problem is uncontrolled. Exacerbating diseases include diabetes and obesity. Pertinent negatives include no chest pain, myalgias or  shortness of breath. Current antihyperlipidemic treatment includes statins. Risk factors for coronary artery disease include diabetes mellitus, dyslipidemia, hypertension, obesity, post-menopausal, a sedentary lifestyle and family history.     Review of systems  Constitutional: + Minimally fluctuating body weight,  current  Body mass index is 33.23 kg/m. , no fatigue, no subjective hyperthermia, no subjective hypothermia    Objective:    BP (!) 108/58   Pulse 84   Ht '5\' 4"'$  (1.626 m)   Wt 193 lb 9.6 oz (87.8 kg)   BMI 33.23 kg/m   Wt Readings from Last 3 Encounters:  02/24/22 193 lb 9.6 oz (87.8 kg)  11/19/21 193 lb 6.4 oz (87.7 kg)  06/25/21 196 lb 6.4 oz (89.1 kg)     Results for orders placed or performed in visit on 02/24/22  HgB A1c  Result Value Ref Range   Hemoglobin A1C     HbA1c POC (<> result, manual entry)     HbA1c, POC (prediabetic range)     HbA1c, POC (controlled diabetic range) 7.1 (A)  0.0 - 7.0 %   Complete Blood Count (Most recent): Lab Results  Component Value Date   WBC 6.9 07/26/2016   HGB 12.7 07/26/2016   HCT 39.4 07/26/2016   MCV 85.5 07/26/2016   PLT 240 07/26/2016   Chemistry (most recent): Lab Results  Component Value Date   NA 140 11/12/2021   K 3.9 11/12/2021   CL 99 11/12/2021   CO2 27 11/12/2021   BUN 15 11/12/2021   CREATININE 0.86 11/12/2021   Diabetic Labs (most recent): Lab Results  Component Value Date   HGBA1C 7.1 (A) 02/24/2022   HGBA1C 6.7 11/19/2021   HGBA1C 7.2 (A) 06/25/2021   MICROALBUR 0.9 06/29/2019   MICROALBUR 11 09/28/2017   MICROALBUR 1.2 08/04/2016   Lipid Panel     Component Value Date/Time   CHOL 163 02/17/2022 0921   TRIG 92 02/17/2022 0921   HDL 41 02/17/2022 0921   CHOLHDL 4.0 02/17/2022 0921   CHOLHDL 4.9 06/29/2019 0819   VLDL 36 (H) 08/04/2016 1157   LDLCALC 105 (H) 02/17/2022 0921   LDLCALC 142 (H) 06/29/2019 0819     Assessment & Plan:   1. Uncontrolled type 2 diabetes mellitus  without complication, with long-term current use of insulin ([place])  She presents with controlled fasting  glycemic profile.  Her point-of-care A1c is 7.1%, improving from 8.7%.  She did not document any hypoglycemia.     Recent labs reviewed. Patient remains at a high risk for more acute and chronic complications of diabetes which include CAD, CVA, CKD, retinopathy, and neuropathy. These are all discussed in detail with the patient. I have re-counseled the patient on diet management and weight loss, by adopting a carbohydrate restricted / protein rich  Diet. This patient will benefit from Lifestyle Medicine, especially given her currently uncontrolled dyslipidemia.  - she acknowledges that there is a room for improvement in her food and drink choices. - Suggestion is made for her to avoid simple carbohydrates  from her diet including Cakes, Sweet Desserts, Ice Cream, Soda (diet and regular), Sweet Tea, Candies, Chips, Cookies, Store Bought Juices, Alcohol , Artificial Sweeteners,  Coffee Creamer, and "Sugar-free" Products, Lemonade. This will help patient to have more stable blood glucose profile and potentially avoid unintended weight gain.  The following Lifestyle Medicine recommendations according to [facility]) were discussed and and offered to patient and she  agrees to start the journey:  A. Whole Foods, Plant-Based Nutrition comprising of fruits and vegetables, plant-based proteins, whole-grain carbohydrates was discussed in detail with the patient.   A list for source of those nutrients were also provided to the patient.  Patient will use only water or unsweetened tea for hydration. B.  The need to stay away from risky substances including alcohol, smoking; obtaining 7 to 9 hours of restorative sleep, at least 150 minutes of moderate intensity exercise weekly, the importance of healthy social connections,  and stress management techniques were discussed. C.   A full color page of  Calorie density of various food groups per pound showing examples of each food groups was provided to the patient.   -In light of her presentation with  better and  target glycemic profile, she is advised to continue Tresiba at 80 units nightly, continue Trulicity at 1.5 mg subcutaneously weekly, continue glipizide 5 mg XL p.o. daily at breakfast.    -She is advised to continue monitoring of blood glucose at least twice a day-daily before breakfast and at  bedtime. - Patient is warned not to take insulin without proper monitoring per orders.  -Patient is encouraged to call clinic for blood glucose levels less than 70 or above 200 mg /dl at fasting. -She is known to have intolerance to metformin.    2) HTN: - Her BP is controlled to target. -She is advised to continue her amlodipine 5 mg p.o. daily, her lisinopril/HCTZ this morning.    3) HPL: She presents with significant improvement in her LDL to 105 from 235.  She is responding favorably to Crestor.  She is advised to continue her Crestor 20 mg p.o. daily at bedtime.   Her engagement is suboptimal, however this patient will also benefit from whole food plant-based diet discussed above.   4.  Weight management: Her BMI is 33.2-  She is a candidate for modest weight loss.  I discussed the fact that-10% weight loss will help significantly impact on her diabetes care.  5)  Chronic Care: -Patient is on ACEI and Statin medications and encouraged to continue to follow up with Ophthalmology, Podiatrist at least yearly or according to recommendations, and advised to stay away from smoking. I have recommended yearly flu vaccine and pneumonia vaccination at least every 5 years; moderate intensity exercise for up to 150 minutes weekly; and  sleep for at least 7 hours a day.   Her screening ABI was negative for PAD in February 2022.  This study will be repeated in February 2027, or sooner if needed.    She is advised to  maintain close follow-up with her PMD Catalina Antigua, MD  I spent  26  minutes in the care of the patient today including review of labs from [place], Lipids, Thyroid Function, Hematology (current and previous including abstractions from other facilities); face-to-face time discussing  her blood glucose readings/logs, discussing hypoglycemia and hyperglycemia episodes and symptoms, medications doses, her options of short and long term treatment based on the latest standards of care / guidelines;  discussion about incorporating lifestyle medicine;  and documenting the encounter. Risk reduction counseling performed per USPSTF guidelines to reduce  obesity and cardiovascular risk factors.     Please refer to Patient Instructions for Blood Glucose Monitoring and Insulin/Medications Dosing Guide"  in media tab for additional information. Please  also refer to " Patient Self Inventory" in the Media  tab for reviewed elements of pertinent patient history.  Rebecca Roberson participated in the discussions, expressed understanding, and voiced agreement with the above plans.  All questions were answered to her satisfaction. she is encouraged to contact clinic should she have any questions or concerns prior to her return visit.   Follow up plan: Return in about 4 months (around 06/25/2022) for F/U with Pre-visit Labs, Meter/CGM/Logs, A1c here.  Glade Lloyd, MD Phone: [phone]  Fax: [phone]  -  This note was partially dictated with voice recognition software. Similar sounding words can be transcribed inadequately or may not  be corrected upon review.  02/24/2022, 5:00 PM

## 2022-02-24 NOTE — Patient Instructions (Signed)
                                     Advice for Weight Management  -For most of us the best way to lose weight is by diet management. Generally speaking, diet management means consuming less calories intentionally which over time brings about progressive weight loss.  This can be achieved more effectively by avoiding ultra processed carbohydrates, processed meats, unhealthy fats.    It is critically important to know your numbers: how much calorie you are consuming and how much calorie you need. More importantly, our carbohydrates sources should be unprocessed naturally occurring  complex starch food items.  It is always important to balance nutrition also by  appropriate intake of proteins (mainly plant-based), healthy fats/oils, plenty of fruits and vegetables.   -The American College of Lifestyle Medicine (ACL M) recommends nutrition derived mostly from Whole Food, Plant Predominant Sources example an apple instead of applesauce or apple pie. Eat Plenty of vegetables, Mushrooms, fruits, Legumes, Whole Grains, Nuts, seeds in lieu of processed meats, processed snacks/pastries red meat, poultry, eggs.  Use only water or unsweetened tea for hydration.  The College also recommends the need to stay away from risky substances including alcohol, smoking; obtaining 7-9 hours of restorative sleep, at least 150 minutes of moderate intensity exercise weekly, importance of healthy social connections, and being mindful of stress and seek help when it is overwhelming.    -Sticking to a routine mealtime to eat 3 meals a day and avoiding unnecessary snacks is shown to have a big role in weight control. Under normal circumstances, the only time we burn stored energy is when we are hungry, so allow  some hunger to take place- hunger means no food between appropriate meal times, only water.  It is not advisable to starve.   -It is better to avoid simple carbohydrates including:  Cakes, Sweet Desserts, Ice Cream, Soda (diet and regular), Sweet Tea, Candies, Chips, Cookies, Store Bought Juices, Alcohol in Excess of  1-2 drinks a day, Lemonade,  Artificial Sweeteners, Doughnuts, Coffee Creamers, "Sugar-free" Products, etc, etc.  This is not a complete list.....    -Consulting with certified diabetes educators is proven to provide you with the most accurate and current information on diet.  Also, you may be  interested in discussing diet options/exchanges , we can schedule a visit with Rebecca Roberson, RDN, CDE for individualized nutrition education.  -Exercise: If you are able: 30 -60 minutes a day ,4 days a week, or 150 minutes of moderate intensity exercise weekly.    The longer the better if tolerated.  Combine stretch, strength, and aerobic activities.  If you were told in the past that you have high risk for cardiovascular diseases, or if you are currently symptomatic, you may seek evaluation by your heart doctor prior to initiating moderate to intense exercise programs.                                  Additional Care Considerations for Diabetes/Prediabetes   -Diabetes  is a chronic disease.  The most important care consideration is regular follow-up with your diabetes care provider with the goal being avoiding or delaying its complications and to take advantage of advances in medications and technology.  If appropriate actions are taken early enough, type 2 diabetes can even be   reversed.  Seek information from the right source.  - Whole Food, Plant Predominant Nutrition is highly recommended: Eat Plenty of vegetables, Mushrooms, fruits, Legumes, Whole Grains, Nuts, seeds in lieu of processed meats, processed snacks/pastries red meat, poultry, eggs as recommended by American College of  Lifestyle Medicine (ACLM).  -Type 2 diabetes is known to coexist with other important comorbidities such as high blood pressure and high cholesterol.  It is critical to control not only the  diabetes but also the high blood pressure and high cholesterol to minimize and delay the risk of complications including coronary artery disease, stroke, amputations, blindness, etc.  The good news is that this diet recommendation for type 2 diabetes is also very helpful for managing high cholesterol and high blood blood pressure.  - Studies showed that people with diabetes will benefit from a class of medications known as ACE inhibitors and statins.  Unless there are specific reasons not to be on these medications, the standard of care is to consider getting one from these groups of medications at an optimal doses.  These medications are generally considered safe and proven to help protect the heart and the kidneys.    - People with diabetes are encouraged to initiate and maintain regular follow-up with eye doctors, foot doctors, dentists , and if necessary heart and kidney doctors.     - It is highly recommended that people with diabetes quit smoking or stay away from smoking, and get yearly  flu vaccine and pneumonia vaccine at least every 5 years.  See above for additional recommendations on exercise, sleep, stress management , and healthy social connections.      

## 2022-03-11 ENCOUNTER — Other Ambulatory Visit: Payer: Self-pay | Admitting: "Endocrinology

## 2022-03-13 ENCOUNTER — Emergency Department (HOSPITAL_COMMUNITY): Payer: BC Managed Care – PPO

## 2022-03-13 ENCOUNTER — Encounter (HOSPITAL_COMMUNITY): Payer: Self-pay

## 2022-03-13 ENCOUNTER — Emergency Department (HOSPITAL_COMMUNITY)
Admission: EM | Admit: 2022-03-13 | Discharge: 2022-03-13 | Disposition: A | Discharge status: Home / Self Care | Payer: BC Managed Care – PPO | Attending: Emergency Medicine | Admitting: Emergency Medicine

## 2022-03-13 ENCOUNTER — Other Ambulatory Visit: Payer: Self-pay

## 2022-03-13 DIAGNOSIS — Y92410 Unspecified street and highway as the place of occurrence of the external cause: Secondary | ICD-10-CM | POA: Insufficient documentation

## 2022-03-13 DIAGNOSIS — Z79899 Other long term (current) drug therapy: Secondary | ICD-10-CM | POA: Insufficient documentation

## 2022-03-13 DIAGNOSIS — E876 Hypokalemia: Secondary | ICD-10-CM | POA: Diagnosis not present

## 2022-03-13 DIAGNOSIS — Z7982 Long term (current) use of aspirin: Secondary | ICD-10-CM | POA: Insufficient documentation

## 2022-03-13 DIAGNOSIS — I10 Essential (primary) hypertension: Secondary | ICD-10-CM | POA: Insufficient documentation

## 2022-03-13 DIAGNOSIS — R0789 Other chest pain: Secondary | ICD-10-CM | POA: Insufficient documentation

## 2022-03-13 DIAGNOSIS — R109 Unspecified abdominal pain: Secondary | ICD-10-CM | POA: Insufficient documentation

## 2022-03-13 LAB — CBC WITH DIFFERENTIAL/PLATELET
Abs Immature Granulocytes: 0.02 10*3/uL (ref 0.00–0.07)
Basophils Absolute: 0 10*3/uL (ref 0.0–0.1)
Basophils Relative: 0 %
Eosinophils Absolute: 0.2 10*3/uL (ref 0.0–0.5)
Eosinophils Relative: 2 %
HCT: 41.4 % (ref 36.0–46.0)
Hemoglobin: 13.1 g/dL (ref 12.0–15.0)
Immature Granulocytes: 0 %
Lymphocytes Relative: 43 %
Lymphs Abs: 3.9 10*3/uL (ref 0.7–4.0)
MCH: 27.7 pg (ref 26.0–34.0)
MCHC: 31.6 g/dL (ref 30.0–36.0)
MCV: 87.5 fL (ref 80.0–100.0)
Monocytes Absolute: 0.7 10*3/uL (ref 0.1–1.0)
Monocytes Relative: 8 %
Neutro Abs: 4.3 10*3/uL (ref 1.7–7.7)
Neutrophils Relative %: 47 %
Platelets: 314 10*3/uL (ref 150–400)
RBC: 4.73 MIL/uL (ref 3.87–5.11)
RDW: 14.5 % (ref 11.5–15.5)
WBC: 9.2 10*3/uL (ref 4.0–10.5)
nRBC: 0 % (ref 0.0–0.2)

## 2022-03-13 LAB — COMPREHENSIVE METABOLIC PANEL
ALT: 28 U/L (ref 0–44)
AST: 34 U/L (ref 15–41)
Albumin: 4.1 g/dL (ref 3.5–5.0)
Alkaline Phosphatase: 66 U/L (ref 38–126)
Anion gap: 10 (ref 5–15)
BUN: 17 mg/dL (ref 8–23)
CO2: 28 mmol/L (ref 22–32)
Calcium: 9.2 mg/dL (ref 8.9–10.3)
Chloride: 98 mmol/L (ref 98–111)
Creatinine, Ser: 0.86 mg/dL (ref 0.44–1.00)
GFR, Estimated: >60 mL/min (ref >60)
Glucose, Bld: 163 mg/dL — ABNORMAL HIGH (ref 70–99)
Potassium: 3.1 mmol/L — ABNORMAL LOW (ref 3.5–5.1)
Sodium: 136 mmol/L (ref 135–145)
Total Bilirubin: 0.3 mg/dL (ref 0.3–1.2)
Total Protein: 8 g/dL (ref 6.5–8.1)

## 2022-03-13 MED ORDER — IBUPROFEN 800 MG PO TABS: 800.0000 mg | ORAL_TABLET | Freq: Three times a day (TID) | ORAL | 0 refills | Status: AC | PRN

## 2022-03-13 MED ORDER — POTASSIUM CHLORIDE CRYS ER 20 MEQ PO TBCR
40.0000 meq | EXTENDED_RELEASE_TABLET | Freq: Once | ORAL | Status: AC
  Administered 2022-03-13: 40 meq via ORAL
  Filled 2022-03-13: qty 2

## 2022-03-13 MED ORDER — IOHEXOL 300 MG/ML  SOLN
100.0000 mL | Freq: Once | INTRAMUSCULAR | Status: AC | PRN
  Administered 2022-03-13: 100 mL via INTRAVENOUS
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
## 2022-04-03 ENCOUNTER — Other Ambulatory Visit: Payer: Self-pay | Admitting: "Endocrinology

## 2022-04-21 ENCOUNTER — Other Ambulatory Visit: Payer: Self-pay | Admitting: "Endocrinology

## 2022-05-12 ENCOUNTER — Other Ambulatory Visit: Payer: Self-pay | Admitting: "Endocrinology

## 2022-05-12 DIAGNOSIS — E1165 Type 2 diabetes mellitus with hyperglycemia: Secondary | ICD-10-CM

## 2022-06-02 ENCOUNTER — Other Ambulatory Visit: Payer: Self-pay | Admitting: "Endocrinology

## 2022-06-20 ENCOUNTER — Other Ambulatory Visit: Payer: Self-pay | Admitting: "Endocrinology

## 2022-06-23 ENCOUNTER — Other Ambulatory Visit: Payer: Self-pay

## 2022-06-23 DIAGNOSIS — E1165 Type 2 diabetes mellitus with hyperglycemia: Secondary | ICD-10-CM

## 2022-06-23 MED ORDER — TRULICITY 1.5 MG/0.5ML ~~LOC~~ SOAJ: SUBCUTANEOUS | 0 refills | Status: DC

## 2022-06-26 LAB — COMPREHENSIVE METABOLIC PANEL
ALT: 28 IU/L (ref 0–32)
AST: 31 IU/L (ref 0–40)
Albumin/Globulin Ratio: 1.4 (ref 1.2–2.2)
Albumin: 4 g/dL (ref 3.9–4.9)
Alkaline Phosphatase: 71 IU/L (ref 44–121)
BUN/Creatinine Ratio: 18 (ref 12–28)
BUN: 18 mg/dL (ref 8–27)
Bilirubin Total: 0.3 mg/dL (ref 0.0–1.2)
CO2: 29 mmol/L (ref 20–29)
Calcium: 9.1 mg/dL (ref 8.7–10.3)
Chloride: 101 mmol/L (ref 96–106)
Creatinine, Ser: 1.01 mg/dL — ABNORMAL HIGH (ref 0.57–1.00)
Globulin, Total: 2.8 g/dL (ref 1.5–4.5)
Glucose: 163 mg/dL — ABNORMAL HIGH (ref 70–99)
Potassium: 3.6 mmol/L (ref 3.5–5.2)
Sodium: 143 mmol/L (ref 134–144)
Total Protein: 6.8 g/dL (ref 6.0–8.5)
eGFR: 60 mL/min/{1.73_m2} (ref >59)

## 2022-06-26 LAB — LIPID PANEL
Chol/HDL Ratio: 3.5 ratio (ref 0.0–4.4)
Cholesterol, Total: 137 mg/dL (ref 100–199)
HDL: 39 mg/dL — ABNORMAL LOW (ref >39)
LDL Chol Calc (NIH): 81 mg/dL (ref 0–99)
Triglycerides: 90 mg/dL (ref 0–149)
VLDL Cholesterol Cal: 17 mg/dL (ref 5–40)

## 2022-06-30 ENCOUNTER — Ambulatory Visit: Payer: BC Managed Care – PPO | Admitting: "Endocrinology

## 2022-07-14 ENCOUNTER — Encounter: Payer: Self-pay | Admitting: "Endocrinology

## 2022-07-14 ENCOUNTER — Ambulatory Visit: Payer: BC Managed Care – PPO | Admitting: "Endocrinology

## 2022-07-14 VITALS — BP 112/56 | HR 68 | Ht 64.0 in | Wt 193.2 lb

## 2022-07-14 DIAGNOSIS — E1165 Type 2 diabetes mellitus with hyperglycemia: Secondary | ICD-10-CM

## 2022-07-14 DIAGNOSIS — I10 Essential (primary) hypertension: Secondary | ICD-10-CM

## 2022-07-14 DIAGNOSIS — E6609 Other obesity due to excess calories: Secondary | ICD-10-CM

## 2022-07-14 DIAGNOSIS — Z6833 Body mass index (BMI) 33.0-33.9, adult: Secondary | ICD-10-CM

## 2022-07-14 DIAGNOSIS — E782 Mixed hyperlipidemia: Secondary | ICD-10-CM

## 2022-07-14 DIAGNOSIS — Z794 Long term (current) use of insulin: Secondary | ICD-10-CM

## 2022-07-14 LAB — POCT GLYCOSYLATED HEMOGLOBIN (HGB A1C): HbA1c, POC (controlled diabetic range): 7.6 % — AB (ref 0.0–7.0)

## 2022-07-14 MED ORDER — FREESTYLE LIBRE 3 READER DEVI: 1.0000 | Freq: Once | 0 refills | Status: AC | PRN

## 2022-07-14 MED ORDER — FREESTYLE LIBRE 3 SENSOR MISC: 1.0000 | 2 refills | Status: DC

## 2022-07-14 MED ORDER — TRULICITY 3 MG/0.5ML ~~LOC~~ SOAJ: 3.0000 mg | SUBCUTANEOUS | 1 refills | Status: DC

## 2022-07-14 NOTE — Progress Notes (Signed)
07/14/2022   Endocrinology follow-up note    Subjective:    Patient ID: Rebecca Roberson, female    DOB: February 19, 1950,    Past Medical History:  Diagnosis Date  . Arthritis   . Cervical myelopathy (HCC) 06/30/2016  . Diabetes mellitus without complication (HCC)   . GERD (gastroesophageal reflux disease)   . Heart murmur   . History of kidney stones    has one now  . Hyperlipidemia   . Hypertension   . Sleep apnea    cpap have not used 3 yrs   Past Surgical History:  Procedure Laterality Date  . ANTERIOR CERVICAL DECOMP/DISCECTOMY FUSION N/A 07/30/2016   Procedure: ANTERIOR CERVICAL DECOMPRESSION/DISCECTOMY FUSION CERVICAL TWO-THREE;  Surgeon: Coletta Memos, MD;  Location: [facility] OR;  Service: Neurosurgery;  Laterality: N/A;  ANTERIOR CERVICAL DECOMPRESSION/DISCECTOMY FUSION CERVICAL 2- CERVICAL 3  . CERVICAL CONE BIOPSY     Social History   Socioeconomic History  . Marital status: Single    Spouse name: Not on file  . Number of children: 0  . Years of education: 64  . Highest education level: Not on file  Occupational History  . Occupation: [employer]  Tobacco Use  . Smoking status: Never  . Smokeless tobacco: Never  Vaping Use  . Vaping Use: Never used  Substance and Sexual Activity  . Alcohol use: No    Alcohol/week: 0.0 standard drinks of alcohol  . Drug use: No  . Sexual activity: Not on file  Other Topics Concern  . Not on file  Social History Narrative   Lives alone   Caffeine use: Drinks soda (20oz per day)   Tea sometimes   Right-handed   Social Determinants of Health   Financial Resource Strain: Not on file  Food Insecurity: Not on file  Transportation Needs: Not on file  Physical Activity: Not on file  Stress: Not on file  Social Connections: Not on file   Outpatient Encounter Medications as of 07/14/2022  Medication Sig  . Continuous Glucose Receiver (FREESTYLE LIBRE 3 READER) DEVI 1 Piece by Does not apply route once as needed for up to  1 dose.  . Continuous Glucose Sensor (FREESTYLE LIBRE 3 SENSOR) MISC 1 Piece by Does not apply route every 14 (fourteen) days. Place 1 sensor on the skin every 14 days. Use to check glucose continuously  . Dulaglutide (TRULICITY) 3 MG/0.5ML SOPN Inject 3 mg as directed once a week.  Marland Kitchen amLODipine (NORVASC) 5 MG tablet Take by mouth daily.  Marland Kitchen aspirin EC 81 MG tablet Take 81 mg by mouth daily.  . cyclobenzaprine (FLEXERIL) 10 MG tablet Take 10 mg by mouth at bedtime as needed for muscle spasms.   Marland Kitchen EASY COMFORT PEN NEEDLES 31G X 8 MM MISC USE AS DIRECTED DAILY.  . fluticasone (FLONASE) 50 MCG/ACT nasal spray Place 2 sprays into both nostrils daily.  Marland Kitchen gabapentin (NEURONTIN) 300 MG capsule Take 300 mg by mouth as needed.   Marland Kitchen glipiZIDE (GLUCOTROL XL) 5 MG 24 hr tablet TAKE ONE TABLET BY MOUTH DAILY WITH BREAKFAST  . ibuprofen (ADVIL) 800 MG tablet Take 1 tablet (800 mg total) by mouth every 8 (eight) hours as needed for moderate pain.  Marland Kitchen lisinopril-hydrochlorothiazide (PRINZIDE,ZESTORETIC) 20-25 MG per tablet Take 1 tablet by mouth daily.  . mirabegron ER (MYRBETRIQ) 50 MG TB24 tablet Take 1 tablet (50 mg total) by mouth daily.  Marland Kitchen omeprazole (PRILOSEC) 20 MG capsule Take 20 mg by mouth daily.  Letta Pate VERIO test strip  USE TO CHECK BLOOD SUGAR TWICE DAILY.  Marland Kitchen PARoxetine (PAXIL) 20 MG tablet Take 20 mg by mouth daily.  . potassium chloride (K-DUR,KLOR-CON) 10 MEQ tablet Take 10 mEq by mouth daily.  . rosuvastatin (CRESTOR) 20 MG tablet TAKE ONE TABLET BY MOUTH ONCE DAILY  . traMADol (ULTRAM) 50 MG tablet Take 2 tablets (100 mg total) by mouth every 6 (six) hours as needed for moderate pain.  . TRESIBA FLEXTOUCH 200 UNIT/ML FlexTouch Pen INJECT 100 UNITS SUBCUTANEOUSLY AT BEDTIME (Patient taking differently: 80 Units at bedtime.)  . Vitamin D, Ergocalciferol, (DRISDOL) 50000 UNITS CAPS capsule Take 50,000 Units by mouth every Monday.   . [DISCONTINUED] Dulaglutide (TRULICITY) 1.5 MG/0.5ML SOPN INJECT  1.5MG  SUBCUTANEOUSLY ONCE A WEEK   No facility-administered encounter medications on file as of 07/14/2022.   ALLERGIES: Allergies  Allergen Reactions  . Metformin Diarrhea  . Statins Other (See Comments)    Weak all over , muscle weakness   . Sulfa Antibiotics Rash and Other (See Comments)    Gets a fever   . Sulfasalazine Other (See Comments) and Rash    Gets a fever    VACCINATION STATUS: Immunization History  Administered Date(s) Administered  . Moderna Covid-19 Vaccine Bivalent Booster 81yrs & up 12/16/2020  . Moderna Sars-Covid-2 Vaccination 08/05/2020    Diabetes She presents for her follow-up diabetic visit. She has type 2 diabetes mellitus. Onset time: Diagnosed at approximate age of 64 yrs. Her disease course has been improving. There are no hypoglycemic associated symptoms. Pertinent negatives for hypoglycemia include no confusion, headaches, pallor or seizures. There are no diabetic associated symptoms. Pertinent negatives for diabetes include no chest pain and no polyuria. Symptoms are improving. There are no diabetic complications. Risk factors for coronary artery disease include dyslipidemia, hypertension and sedentary lifestyle. Current diabetic treatment includes insulin injections (she is on Tresiba 110 units qhs, Januvia 100mg  po qam, glipizide 5 mg qam.). Her weight is decreasing steadily. She is following a generally unhealthy diet. When asked about meal planning, she reported none (Admits to dietary indiscretion. She drinks soda regularly.). She rarely participates in exercise. Her home blood glucose trend is decreasing steadily. Her breakfast blood glucose range is generally 130-140 mg/dl. Her overall blood glucose range is 130-140 mg/dl. (She presents with controlled fasting  glycemic profile.  Her point-of-care A1c is 7.1%, improving from 8.7%.  She did not document any hypoglycemia.      )  Hypertension This is a chronic problem. The current episode started more  than 1 year ago. Pertinent negatives include no chest pain, headaches, palpitations or shortness of breath. Risk factors for coronary artery disease include family history, dyslipidemia, sedentary lifestyle, diabetes mellitus and post-menopausal state. Past treatments include ACE inhibitors.  Hyperlipidemia This is a chronic problem. The current episode started more than 1 year ago. The problem is uncontrolled. Exacerbating diseases include diabetes and obesity. Pertinent negatives include no chest pain, myalgias or shortness of breath. Current antihyperlipidemic treatment includes statins. Risk factors for coronary artery disease include diabetes mellitus, dyslipidemia, hypertension, obesity, post-menopausal, a sedentary lifestyle and family history.     Review of systems  Constitutional: + Minimally fluctuating body weight,  current  Body mass index is 33.16 kg/m. , no fatigue, no subjective hyperthermia, no subjective hypothermia    Objective:    BP (!) 112/56   Pulse 68   Ht 5\' 4"  (1.626 m)   Wt 193 lb 3.2 oz (87.6 kg)   BMI 33.16 kg/m   Wt Readings  from Last 3 Encounters:  07/14/22 193 lb 3.2 oz (87.6 kg)  03/13/22 193 lb (87.5 kg)  02/24/22 193 lb 9.6 oz (87.8 kg)     Results for orders placed or performed in visit on 07/14/22  HgB A1c  Result Value Ref Range   Hemoglobin A1C     HbA1c POC (<> result, manual entry)     HbA1c, POC (prediabetic range)     HbA1c, POC (controlled diabetic range) 7.6 (A) 0.0 - 7.0 %   Complete Blood Count (Most recent): Lab Results  Component Value Date   WBC 9.2 03/13/2022   HGB 13.1 03/13/2022   HCT 41.4 03/13/2022   MCV 87.5 03/13/2022   PLT 314 03/13/2022   Chemistry (most recent): Lab Results  Component Value Date   NA 143 06/25/2022   K 3.6 06/25/2022   CL 101 06/25/2022   CO2 29 06/25/2022   BUN 18 06/25/2022   CREATININE 1.01 (H) 06/25/2022   Diabetic Labs (most recent): Lab Results  Component Value Date   HGBA1C 7.6  (A) 07/14/2022   HGBA1C 7.1 (A) 02/24/2022   HGBA1C 6.7 11/19/2021   MICROALBUR 0.9 06/29/2019   MICROALBUR 11 09/28/2017   MICROALBUR 1.2 08/04/2016   Lipid Panel     Component Value Date/Time   CHOL 137 06/25/2022 0911   TRIG 90 06/25/2022 0911   HDL 39 (L) 06/25/2022 0911   CHOLHDL 3.5 06/25/2022 0911   CHOLHDL 4.9 06/29/2019 0819   VLDL 36 (H) 08/04/2016 1157   LDLCALC 81 06/25/2022 0911   LDLCALC 142 (H) 06/29/2019 0819     Assessment & Plan:   1. Uncontrolled type 2 diabetes mellitus without complication, with long-term current use of insulin (HCC)  She presents with controlled fasting  glycemic profile.  Her point-of-care A1c is 7.1%, improving from 8.7%.  She did not document any hypoglycemia.     Recent labs reviewed. Patient remains at a high risk for more acute and chronic complications of diabetes which include CAD, CVA, CKD, retinopathy, and neuropathy. These are all discussed in detail with the patient. I have re-counseled the patient on diet management and weight loss, by adopting a carbohydrate restricted / protein rich  Diet. This patient will benefit from Lifestyle Medicine, especially given her currently uncontrolled dyslipidemia.  - she acknowledges that there is a room for improvement in her food and drink choices. - Suggestion is made for her to avoid simple carbohydrates  from her diet including Cakes, Sweet Desserts, Ice Cream, Soda (diet and regular), Sweet Tea, Candies, Chips, Cookies, Store Bought Juices, Alcohol , Artificial Sweeteners,  Coffee Creamer, and "Sugar-free" Products, Lemonade. This will help patient to have more stable blood glucose profile and potentially avoid unintended weight gain.  The following Lifestyle Medicine recommendations according to American College of Lifestyle Medicine  [facility]) were discussed and and offered to patient and she  agrees to start the journey:  A. Whole Foods, Plant-Based Nutrition comprising of fruits and  vegetables, plant-based proteins, whole-grain carbohydrates was discussed in detail with the patient.   A list for source of those nutrients were also provided to the patient.  Patient will use only water or unsweetened tea for hydration. B.  The need to stay away from risky substances including alcohol, smoking; obtaining 7 to 9 hours of restorative sleep, at least 150 minutes of moderate intensity exercise weekly, the importance of healthy social connections,  and stress management techniques were discussed. C.  A full color page of  Calorie density of various food groups per pound showing examples of each food groups was provided to the patient.   -In light of her presentation with  better and  target glycemic profile, she is advised to continue Tresiba at 80 units nightly, continue Trulicity at 1.5 mg subcutaneously weekly, continue glipizide 5 mg XL p.o. daily at breakfast.    -She is advised to continue monitoring of blood glucose at least twice a day-daily before breakfast and at bedtime. - Patient is warned not to take insulin without proper monitoring per orders.  -Patient is encouraged to call clinic for blood glucose levels less than 70 or above 200 mg /dl at fasting. -She is known to have intolerance to metformin.    2) HTN: - Her BP is controlled to target. -She is advised to continue her amlodipine 5 mg p.o. daily, her lisinopril/HCTZ this morning.    3) HPL: She presents with significant improvement in her LDL to 105 from 235.  She is responding favorably to Crestor.  She is advised to continue her Crestor 20 mg p.o. daily at bedtime.   Her engagement is suboptimal, however this patient will also benefit from whole food plant-based diet discussed above.   4.  Weight management: Her BMI is 33.2-  She is a candidate for modest weight loss.  I discussed the fact that-10% weight loss will help significantly impact on her diabetes care.  5)  Chronic Care: -Patient is on ACEI and  Statin medications and encouraged to continue to follow up with Ophthalmology, Podiatrist at least yearly or according to recommendations, and advised to stay away from smoking. I have recommended yearly flu vaccine and pneumonia vaccination at least every 5 years; moderate intensity exercise for up to 150 minutes weekly; and  sleep for at least 7 hours a day.   Her screening ABI was negative for PAD in February 2022.  This study will be repeated in February 2027, or sooner if needed.    She is advised to maintain close follow-up with her PMD Sundra Aland, MD  I spent  26  minutes in the care of the patient today including review of labs from CMP, Lipids, Thyroid Function, Hematology (current and previous including abstractions from other facilities); face-to-face time discussing  her blood glucose readings/logs, discussing hypoglycemia and hyperglycemia episodes and symptoms, medications doses, her options of short and long term treatment based on the latest standards of care / guidelines;  discussion about incorporating lifestyle medicine;  and documenting the encounter. Risk reduction counseling performed per USPSTF guidelines to reduce  obesity and cardiovascular risk factors.     Please refer to Patient Instructions for Blood Glucose Monitoring and Insulin/Medications Dosing Guide"  in media tab for additional information. Please  also refer to " Patient Self Inventory" in the Media  tab for reviewed elements of pertinent patient history.  Rebecca Roberson participated in the discussions, expressed understanding, and voiced agreement with the above plans.  All questions were answered to her satisfaction. she is encouraged to contact clinic should she have any questions or concerns prior to her return visit.   Follow up plan: Return in about 4 months (around 11/13/2022) for Bring Meter/CGM Device/Logs- A1c in Office.  Marquis Lunch, MD Phone: [phone]  Fax: [phone]  -  This note was  partially dictated with voice recognition software. Similar sounding words can be transcribed inadequately or may not  be corrected upon review.  07/14/2022, 5:42 PM

## 2022-07-14 NOTE — Patient Instructions (Signed)

## 2022-07-22 ENCOUNTER — Other Ambulatory Visit: Payer: Self-pay | Admitting: "Endocrinology

## 2022-07-25 ENCOUNTER — Other Ambulatory Visit: Payer: Self-pay | Admitting: "Endocrinology

## 2022-08-10 ENCOUNTER — Ambulatory Visit: Payer: BC Managed Care – PPO

## 2022-08-12 ENCOUNTER — Ambulatory Visit (INDEPENDENT_AMBULATORY_CARE_PROVIDER_SITE_OTHER): Payer: BC Managed Care – PPO

## 2022-08-12 VITALS — Ht 64.0 in | Wt 193.0 lb

## 2022-08-12 DIAGNOSIS — E1165 Type 2 diabetes mellitus with hyperglycemia: Secondary | ICD-10-CM

## 2022-08-12 NOTE — Progress Notes (Signed)
Pt brings in her new Freestyle Libre 3 today. Went over application and usage instructions. Pt voices understanding. She chooses to use the [place] reader but will add the app to her phone if she wants to switch. Gave pt instructions for the [place] 3 app. She will call if she has any questions/concerns.

## 2022-08-22 ENCOUNTER — Other Ambulatory Visit: Payer: Self-pay | Admitting: "Endocrinology

## 2022-08-22 DIAGNOSIS — E1165 Type 2 diabetes mellitus with hyperglycemia: Secondary | ICD-10-CM

## 2022-09-08 ENCOUNTER — Telehealth: Payer: Self-pay

## 2022-09-08 ENCOUNTER — Other Ambulatory Visit: Payer: Self-pay

## 2022-09-08 DIAGNOSIS — E1165 Type 2 diabetes mellitus with hyperglycemia: Secondary | ICD-10-CM

## 2022-09-08 MED ORDER — TRULICITY 3 MG/0.5ML ~~LOC~~ SOAJ: 3.0000 mg | SUBCUTANEOUS | 0 refills | Status: DC

## 2022-09-08 NOTE — Telephone Encounter (Signed)
Tried to return call to pt but did not receive an answer and was unable to leave a message.

## 2022-09-11 ENCOUNTER — Other Ambulatory Visit: Payer: Self-pay | Admitting: "Endocrinology

## 2022-09-27 ENCOUNTER — Other Ambulatory Visit: Payer: Self-pay | Admitting: "Endocrinology

## 2022-09-30 LAB — HM DIABETES EYE EXAM

## 2022-10-19 ENCOUNTER — Other Ambulatory Visit: Payer: Self-pay | Admitting: "Endocrinology

## 2022-10-24 ENCOUNTER — Other Ambulatory Visit: Payer: Self-pay | Admitting: "Endocrinology

## 2022-11-08 ENCOUNTER — Other Ambulatory Visit: Payer: Self-pay

## 2022-11-08 DIAGNOSIS — E1165 Type 2 diabetes mellitus with hyperglycemia: Secondary | ICD-10-CM

## 2022-11-08 MED ORDER — FREESTYLE LIBRE 3 PLUS SENSOR MISC: 2 refills | Status: DC

## 2022-11-08 NOTE — Telephone Encounter (Signed)
Rx for [place] 3 Plus sensors sent to [facility].

## 2022-11-17 ENCOUNTER — Encounter: Payer: Self-pay | Admitting: "Endocrinology

## 2022-11-17 ENCOUNTER — Ambulatory Visit: Payer: BC Managed Care – PPO | Admitting: "Endocrinology

## 2022-11-17 VITALS — BP 110/54 | HR 84 | Ht 64.0 in | Wt 196.6 lb

## 2022-11-17 DIAGNOSIS — I10 Essential (primary) hypertension: Secondary | ICD-10-CM

## 2022-11-17 DIAGNOSIS — E782 Mixed hyperlipidemia: Secondary | ICD-10-CM

## 2022-11-17 DIAGNOSIS — Z794 Long term (current) use of insulin: Secondary | ICD-10-CM | POA: Diagnosis not present

## 2022-11-17 DIAGNOSIS — Z6833 Body mass index (BMI) 33.0-33.9, adult: Secondary | ICD-10-CM

## 2022-11-17 DIAGNOSIS — E1165 Type 2 diabetes mellitus with hyperglycemia: Secondary | ICD-10-CM

## 2022-11-17 DIAGNOSIS — E66811 Other obesity due to excess calories: Secondary | ICD-10-CM

## 2022-11-17 DIAGNOSIS — E6609 Other obesity due to excess calories: Secondary | ICD-10-CM

## 2022-11-17 LAB — POCT GLYCOSYLATED HEMOGLOBIN (HGB A1C): HbA1c, POC (controlled diabetic range): 7.4 % — AB (ref 0.0–7.0)

## 2022-11-17 MED ORDER — TRULICITY 4.5 MG/0.5ML ~~LOC~~ SOAJ: 4.5000 mg | SUBCUTANEOUS | 1 refills | Status: DC

## 2022-11-17 MED ORDER — INSULIN PEN NEEDLE 29G X 5MM MISC: 1 refills | Status: AC

## 2022-11-17 MED ORDER — GLIPIZIDE ER 5 MG PO TB24: 5.0000 mg | ORAL_TABLET | Freq: Every day | ORAL | 1 refills | Status: DC

## 2022-11-17 NOTE — Patient Instructions (Signed)

## 2022-11-17 NOTE — Progress Notes (Signed)
11/17/2022   Endocrinology follow-up note    Subjective:    Patient ID: Rebecca Roberson, female    DOB: 1950/03/08,    Past Medical History:  Diagnosis Date   Arthritis    Cervical myelopathy (HCC) 06/30/2016   Diabetes mellitus without complication (HCC)    GERD (gastroesophageal reflux disease)    Heart murmur    History of kidney stones    has one now   Hyperlipidemia    Hypertension    Sleep apnea    cpap have not used 3 yrs   Past Surgical History:  Procedure Laterality Date   ANTERIOR CERVICAL DECOMP/DISCECTOMY FUSION N/A 07/30/2016   Procedure: ANTERIOR CERVICAL DECOMPRESSION/DISCECTOMY FUSION CERVICAL TWO-THREE;  Surgeon: Coletta Memos, MD;  Location: [facility] OR;  Service: Neurosurgery;  Laterality: N/A;  ANTERIOR CERVICAL DECOMPRESSION/DISCECTOMY FUSION CERVICAL 2- CERVICAL 3   CERVICAL CONE BIOPSY     Social History   Socioeconomic History   Marital status: Single    Spouse name: Not on file   Number of children: 0   Years of education: 14   Highest education level: Not on file  Occupational History   Occupation: [employer]  Tobacco Use   Smoking status: Never   Smokeless tobacco: Never  Vaping Use   Vaping status: Never Used  Substance and Sexual Activity   Alcohol use: No    Alcohol/week: 0.0 standard drinks of alcohol   Drug use: No   Sexual activity: Not on file  Other Topics Concern   Not on file  Social History Narrative   Lives alone   Caffeine use: Drinks soda (20oz per day)   Tea sometimes   Right-handed   Social Determinants of Health   Financial Resource Strain: Low Risk  (10/12/2021)   Received from [facility], [facility]   Overall Financial Resource Strain (CARDIA)    Difficulty of Paying Living Expenses: Not very hard  Food Insecurity: No Food Insecurity (10/12/2021)   Received from [facility], [facility]   Hunger Vital Sign    Worried About Running Out of Food in the Last Year: Never true    Ran Out of  Food in the Last Year: Never true  Transportation Needs: No Transportation Needs (10/12/2021)   Received from [facility], [facility]   PRAPARE - Transportation    Lack of Transportation (Medical): No    Lack of Transportation (Non-Medical): No  Physical Activity: Not on file  Stress: Not on file  Social Connections: Not on file   Outpatient Encounter Medications as of 11/17/2022  Medication Sig   Dulaglutide (TRULICITY) 4.5 MG/0.5ML SOAJ Inject 4.5 mg as directed once a week.   Insulin Pen Needle 29G X MISC Use to inject insulin nightly.   amLODipine (NORVASC) 5 MG tablet Take by mouth daily.   aspirin EC 81 MG tablet Take 81 mg by mouth daily.   Continuous Glucose Receiver (FREESTYLE LIBRE 3 READER) DEVI 1 Piece by Does not apply route once as needed for up to 1 dose.   Continuous Glucose Sensor (FREESTYLE LIBRE 3 PLUS SENSOR) MISC Use to monitor glucose continuously as directed. Change sensor every 15 days.   cyclobenzaprine (FLEXERIL) 10 MG tablet Take 10 mg by mouth at bedtime as needed for muscle spasms.    fluticasone (FLONASE) 50 MCG/ACT nasal spray Place 2 sprays into both nostrils daily.   gabapentin (NEURONTIN) 300 MG capsule Take 300 mg by mouth as needed.  glipiZIDE (GLUCOTROL XL) 5 MG 24 hr tablet Take 1 tablet (5 mg total) by mouth daily with breakfast.   ibuprofen (ADVIL) 800 MG tablet Take 1 tablet (800 mg total) by mouth every 8 (eight) hours as needed for moderate pain.   insulin degludec (TRESIBA FLEXTOUCH) 200 UNIT/ML FlexTouch Pen Inject 80 Units into the skin at bedtime.   lisinopril-hydrochlorothiazide (PRINZIDE,ZESTORETIC) 20-25 MG per tablet Take 1 tablet by mouth daily.   mirabegron ER (MYRBETRIQ) 50 MG TB24 tablet Take 1 tablet (50 mg total) by mouth daily.   omeprazole (PRILOSEC) 20 MG capsule Take 20 mg by mouth daily.   ONETOUCH VERIO test strip USE TO CHECK BLOOD SUGAR TWICE DAILY.   PARoxetine (PAXIL) 20 MG tablet Take 20 mg by mouth daily.    potassium chloride (K-DUR,KLOR-CON) 10 MEQ tablet Take 10 mEq by mouth daily.   rosuvastatin (CRESTOR) 20 MG tablet TAKE 1 TABLET BY MOUTH DAILY   traMADol (ULTRAM) 50 MG tablet Take 2 tablets (100 mg total) by mouth every 6 (six) hours as needed for moderate pain.   Vitamin D, Ergocalciferol, (DRISDOL) 50000 UNITS CAPS capsule Take 50,000 Units by mouth every Monday.    [DISCONTINUED] Dulaglutide (TRULICITY) 3 MG/0.5ML SOPN Inject 3 mg as directed once a week.   [DISCONTINUED] EASY COMFORT PEN NEEDLES 31G X 8 MM MISC USE AS DIRECTED DAILY.   [DISCONTINUED] glipiZIDE (GLUCOTROL XL) 5 MG 24 hr tablet TAKE 1 TABLET BY MOUTH EVERY DAY WITH BREAKFAST   No facility-administered encounter medications on file as of 11/17/2022.   ALLERGIES: Allergies  Allergen Reactions   Metformin Diarrhea   Statins Other (See Comments)    Weak all over , muscle weakness    Sulfa Antibiotics Rash and Other (See Comments)    Gets a fever    Sulfasalazine Other (See Comments) and Rash    Gets a fever    VACCINATION STATUS: Immunization History  Administered Date(s) Administered   Moderna Covid-19 Vaccine Bivalent Booster 17yrs & up 12/16/2020   Moderna Sars-Covid-2 Vaccination 08/05/2020    Diabetes She presents for her follow-up diabetic visit. She has type 2 diabetes mellitus. Onset time: Diagnosed at approximate age of 37 yrs. Her disease course has been improving. There are no hypoglycemic associated symptoms. Pertinent negatives for hypoglycemia include no confusion, headaches, pallor or seizures. There are no diabetic associated symptoms. Pertinent negatives for diabetes include no chest pain and no polyuria. Symptoms are improving. There are no diabetic complications. Risk factors for coronary artery disease include dyslipidemia, hypertension and sedentary lifestyle. Current diabetic treatment includes insulin injections (she is on Tresiba 110 units qhs, Januvia 100mg  po qam, glipizide 5 mg qam.). Her  weight is increasing steadily. She is following a generally unhealthy diet. When asked about meal planning, she reported none (Admits to dietary indiscretion. She drinks soda regularly.). She rarely participates in exercise. Her home blood glucose trend is decreasing steadily. Her breakfast blood glucose range is generally 140-180 mg/dl. Her bedtime blood glucose range is generally 140-180 mg/dl. Her overall blood glucose range is 140-180 mg/dl. (she presents with controlled fasting glycemic profile, benefiting from freestyle libre device.  She presents with 79% in range, 19% level 1 hyperglycemia, 2% able to hyperglycemia.  Her point-of-care A1c is 7.4%, improving.  Her average blood glucose is 147 for the last 14 days.   She did not document any hypoglycemia.  )  Hypertension This is a chronic problem. The current episode started more than 1 year ago. Pertinent negatives include  no chest pain, headaches, palpitations or shortness of breath. Risk factors for coronary artery disease include family history, dyslipidemia, sedentary lifestyle, diabetes mellitus and post-menopausal state. Past treatments include ACE inhibitors.  Hyperlipidemia This is a chronic problem. The current episode started more than 1 year ago. The problem is uncontrolled. Exacerbating diseases include diabetes and obesity. Pertinent negatives include no chest pain, myalgias or shortness of breath. Current antihyperlipidemic treatment includes statins. Risk factors for coronary artery disease include diabetes mellitus, dyslipidemia, hypertension, obesity, post-menopausal, a sedentary lifestyle and family history.    Review of systems  Constitutional: + Minimally fluctuating body weight,  current  Body mass index is 33.75 kg/m. , no fatigue, no subjective hyperthermia, no subjective hypothermia    Objective:    BP (!) 110/54   Pulse 84   Ht 5\' 4"  (1.626 m)   Wt 196 lb 9.6 oz (89.2 kg)   BMI 33.75 kg/m   Wt Readings from Last  3 Encounters:  11/17/22 196 lb 9.6 oz (89.2 kg)  08/12/22 193 lb (87.5 kg)  07/14/22 193 lb 3.2 oz (87.6 kg)     Results for orders placed or performed in visit on 11/17/22  HgB A1c  Result Value Ref Range   Hemoglobin A1C     HbA1c POC (<> result, manual entry)     HbA1c, POC (prediabetic range)     HbA1c, POC (controlled diabetic range) 7.4 (A) 0.0 - 7.0 %   Complete Blood Count (Most recent): Lab Results  Component Value Date   WBC 9.2 03/13/2022   HGB 13.1 03/13/2022   HCT 41.4 03/13/2022   MCV 87.5 03/13/2022   PLT 314 03/13/2022   Chemistry (most recent): Lab Results  Component Value Date   NA 143 06/25/2022   K 3.6 06/25/2022   CL 101 06/25/2022   CO2 29 06/25/2022   BUN 18 06/25/2022   CREATININE 1.01 (H) 06/25/2022   Diabetic Labs (most recent): Lab Results  Component Value Date   HGBA1C 7.4 (A) 11/17/2022   HGBA1C 7.6 (A) 07/14/2022   HGBA1C 7.1 (A) 02/24/2022   MICROALBUR 0.9 06/29/2019   MICROALBUR 11 09/28/2017   MICROALBUR 1.2 08/04/2016   Lipid Panel     Component Value Date/Time   CHOL 137 06/25/2022 0911   TRIG 90 06/25/2022 0911   HDL 39 (L) 06/25/2022 0911   CHOLHDL 3.5 06/25/2022 0911   CHOLHDL 4.9 06/29/2019 0819   VLDL 36 (H) 08/04/2016 1157   LDLCALC 81 06/25/2022 0911   LDLCALC 142 (H) 06/29/2019 0819     Assessment & Plan:   1. Uncontrolled type 2 diabetes mellitus without complication, with long-term current use of insulin (HCC)  she presents with controlled fasting glycemic profile, benefiting from freestyle [place] device.  She presents with 79% in range, 19% level 1 hyperglycemia, 2% able to hyperglycemia.  Her point-of-care A1c is 7.4%, improving.  Her average blood glucose is 147 for the last 14 days.   She did not document any hypoglycemia.     Recent labs reviewed. Patient remains at a high risk for more acute and chronic complications of diabetes which include CAD, CVA, CKD, retinopathy, and neuropathy. These are all  discussed in detail with the patient. I have re-counseled the patient on diet management and weight loss, by adopting a carbohydrate restricted / protein rich  Diet. This patient will benefit from Lifestyle Medicine, especially given her currently uncontrolled dyslipidemia.  - she acknowledges that there is a room for improvement in her  food and drink choices. - Suggestion is made for her to avoid simple carbohydrates  from her diet including Cakes, Sweet Desserts, Ice Cream, Soda (diet and regular), Sweet Tea, Candies, Chips, Cookies, Store Bought Juices, Alcohol , Artificial Sweeteners,  Coffee Creamer, and "Sugar-free" Products, Lemonade. This will help patient to have more stable blood glucose profile and potentially avoid unintended weight gain.  The following Lifestyle Medicine recommendations according to American College of Lifestyle Medicine  [facility]) were discussed and and offered to patient and she  agrees to start the journey:  A. Whole Foods, Plant-Based Nutrition comprising of fruits and vegetables, plant-based proteins, whole-grain carbohydrates was discussed in detail with the patient.   A list for source of those nutrients were also provided to the patient.  Patient will use only water or unsweetened tea for hydration. B.  The need to stay away from risky substances including alcohol, smoking; obtaining 7 to 9 hours of restorative sleep, at least 150 minutes of moderate intensity exercise weekly, the importance of healthy social connections,  and stress management techniques were discussed. C.  A full color page of  Calorie density of various food groups per pound showing examples of each food groups was provided to the patient.  -She has tolerated her current dose of Trulicity.  I discussed and increased her Trulicity to 4.5 mg subcutaneously weekly. -She presents with near target glycemic profile, advised to continue Tresiba 80 units nightly. -She is encouraged to use her CGM  continuously. - She is advised to continue glipizide 5 mg XL p.o. daily.  She does not tolerate metformin .  -Patient is encouraged to call clinic for blood glucose levels less than 70 or above 200 mg /dl at fasting.   2) HTN: -Her blood pressure is controlled to target. -She is advised to continue her amlodipine 5 mg p.o. daily, her lisinopril/HCTZ this morning.    3) HPL: She presents with significant improvement in her LDL to 105 from 235.  She is responding favorably to Crestor.  She is advised to continue Crestor 20 mg p.o. nightly.   Her engagement is suboptimal, however this patient will also benefit from whole food plant-based diet discussed above.   4.  Weight management: Her BMI is 33.75--  She is a candidate for modest weight loss.  I discussed the fact that-10% weight loss will help significantly impact on her diabetes care.  5)  Chronic Care: -Patient is on ACEI and Statin medications and encouraged to continue to follow up with Ophthalmology, Podiatrist at least yearly or according to recommendations, and advised to stay away from smoking. I have recommended yearly flu vaccine and pneumonia vaccination at least every 5 years; moderate intensity exercise for up to 150 minutes weekly; and  sleep for at least 7 hours a day.   Her screening ABI was negative for PAD in February 2022.  This study will be repeated in February 2027, or sooner if needed.   She is advised to maintain close follow-up with her PMD Sundra Aland, MD  I spent  26  minutes in the care of the patient today including review of labs from CMP, Lipids, Thyroid Function, Hematology (current and previous including abstractions from other facilities); face-to-face time discussing  her blood glucose readings/logs, discussing hypoglycemia and hyperglycemia episodes and symptoms, medications doses, her options of short and long term treatment based on the latest standards of care / guidelines;  discussion about  incorporating lifestyle medicine;  and documenting the encounter. Risk  reduction counseling performed per USPSTF guidelines to reduce  obesity and cardiovascular risk factors.     Please refer to Patient Instructions for Blood Glucose Monitoring and Insulin/Medications Dosing Guide"  in media tab for additional information. Please  also refer to " Patient Self Inventory" in the Media  tab for reviewed elements of pertinent patient history.  Rebecca Roberson participated in the discussions, expressed understanding, and voiced agreement with the above plans.  All questions were answered to her satisfaction. she is encouraged to contact clinic should she have any questions or concerns prior to her return visit.    Follow up plan: Return in about 4 months (around 03/20/2023) for F/U with Pre-visit Labs, Meter/CGM/Logs, A1c here.  Marquis Lunch, MD Phone: [phone]  Fax: [phone]  -  This note was partially dictated with voice recognition software. Similar sounding words can be transcribed inadequately or may not  be corrected upon review.  11/17/2022, 4:28 PM

## 2022-11-23 ENCOUNTER — Other Ambulatory Visit: Payer: Self-pay

## 2022-11-23 DIAGNOSIS — E1165 Type 2 diabetes mellitus with hyperglycemia: Secondary | ICD-10-CM

## 2022-11-23 MED ORDER — TRESIBA FLEXTOUCH 200 UNIT/ML ~~LOC~~ SOPN: 80.0000 [IU] | PEN_INJECTOR | Freq: Every day | SUBCUTANEOUS | 0 refills | Status: DC

## 2022-12-13 ENCOUNTER — Telehealth: Payer: Self-pay | Admitting: Urology

## 2022-12-13 ENCOUNTER — Other Ambulatory Visit: Payer: Self-pay

## 2022-12-13 DIAGNOSIS — N200 Calculus of kidney: Secondary | ICD-10-CM

## 2022-12-13 NOTE — Telephone Encounter (Signed)
Patient called to schedule yearly check up, I have her scheduled for 04/14/22 she needs to know if she needs to have an xray before appointment.

## 2022-12-13 NOTE — Telephone Encounter (Signed)
Please let her know she will need one, I added the order.  She can do the day of her appt or the week prior.  Thank you!

## 2023-01-17 ENCOUNTER — Other Ambulatory Visit: Payer: Self-pay | Admitting: "Endocrinology

## 2023-02-13 ENCOUNTER — Other Ambulatory Visit: Payer: Self-pay | Admitting: "Endocrinology

## 2023-02-13 DIAGNOSIS — E1165 Type 2 diabetes mellitus with hyperglycemia: Secondary | ICD-10-CM

## 2023-03-14 ENCOUNTER — Ambulatory Visit (HOSPITAL_COMMUNITY)
Admission: RE | Admit: 2023-03-14 | Discharge: 2023-03-14 | Disposition: A | Discharge status: Home / Self Care | Payer: BC Managed Care – PPO | Source: Ambulatory Visit | Attending: Urology | Admitting: Urology

## 2023-03-14 DIAGNOSIS — N200 Calculus of kidney: Secondary | ICD-10-CM | POA: Diagnosis present

## 2023-03-15 LAB — COMPREHENSIVE METABOLIC PANEL
ALT: 25 [IU]/L (ref 0–32)
AST: 27 [IU]/L (ref 0–40)
Albumin: 4 g/dL (ref 3.9–4.9)
Alkaline Phosphatase: 67 [IU]/L (ref 44–121)
BUN/Creatinine Ratio: 18 (ref 12–28)
BUN: 17 mg/dL (ref 8–27)
Bilirubin Total: 0.3 mg/dL (ref 0.0–1.2)
CO2: 30 mmol/L — ABNORMAL HIGH (ref 20–29)
Calcium: 9.6 mg/dL (ref 8.7–10.3)
Chloride: 100 mmol/L (ref 96–106)
Creatinine, Ser: 0.97 mg/dL (ref 0.57–1.00)
Globulin, Total: 3 g/dL (ref 1.5–4.5)
Glucose: 111 mg/dL — ABNORMAL HIGH (ref 70–99)
Potassium: 3.9 mmol/L (ref 3.5–5.2)
Sodium: 143 mmol/L (ref 134–144)
Total Protein: 7 g/dL (ref 6.0–8.5)
eGFR: 63 mL/min/{1.73_m2} (ref >59)

## 2023-03-19 ENCOUNTER — Other Ambulatory Visit: Payer: Self-pay | Admitting: "Endocrinology

## 2023-03-19 DIAGNOSIS — E1165 Type 2 diabetes mellitus with hyperglycemia: Secondary | ICD-10-CM

## 2023-03-28 ENCOUNTER — Telehealth: Payer: Self-pay

## 2023-03-28 NOTE — Telephone Encounter (Signed)
 Called to relay message to Pt from MD per last telephone encounter

## 2023-03-28 NOTE — Telephone Encounter (Signed)
-----   Message from Bjorn Pippin sent at 03/28/2023 12:24 PM EST ----- Her stone is growing very slowly.  We will discuss at f/u. ----- Message ----- From: Sarajane Jews, CMA Sent: 03/28/2023   8:00 AM EST To: Bjorn Pippin, MD  Please review.

## 2023-04-05 ENCOUNTER — Encounter: Payer: Self-pay | Admitting: "Endocrinology

## 2023-04-05 ENCOUNTER — Ambulatory Visit: Payer: BC Managed Care – PPO | Admitting: "Endocrinology

## 2023-04-05 VITALS — BP 98/54 | HR 76 | Ht 64.0 in | Wt 194.0 lb

## 2023-04-05 DIAGNOSIS — E782 Mixed hyperlipidemia: Secondary | ICD-10-CM

## 2023-04-05 DIAGNOSIS — E1165 Type 2 diabetes mellitus with hyperglycemia: Secondary | ICD-10-CM

## 2023-04-05 DIAGNOSIS — I10 Essential (primary) hypertension: Secondary | ICD-10-CM

## 2023-04-05 DIAGNOSIS — Z794 Long term (current) use of insulin: Secondary | ICD-10-CM

## 2023-04-05 LAB — POCT GLYCOSYLATED HEMOGLOBIN (HGB A1C): HbA1c, POC (controlled diabetic range): 7.7 % — AB (ref 0.0–7.0)

## 2023-04-05 MED ORDER — TRESIBA FLEXTOUCH 200 UNIT/ML ~~LOC~~ SOPN: 70.0000 [IU] | PEN_INJECTOR | Freq: Every day | SUBCUTANEOUS | 1 refills | Status: DC

## 2023-04-05 MED ORDER — EMPAGLIFLOZIN 10 MG PO TABS: 10.0000 mg | ORAL_TABLET | Freq: Every day | ORAL | 1 refills | Status: DC

## 2023-04-05 NOTE — Progress Notes (Signed)
 04/05/2023   Endocrinology follow-up note    Subjective:    Patient ID: Rebecca Roberson, female    DOB: Jun 17, 1950,    Past Medical History:  Diagnosis Date   Arthritis    Cervical myelopathy (HCC) 06/30/2016   Diabetes mellitus without complication (HCC)    GERD (gastroesophageal reflux disease)    Heart murmur    History of kidney stones    has one now   Hyperlipidemia    Hypertension    Sleep apnea    cpap have not used 3 yrs   Past Surgical History:  Procedure Laterality Date   ANTERIOR CERVICAL DECOMP/DISCECTOMY FUSION N/A 07/30/2016   Procedure: ANTERIOR CERVICAL DECOMPRESSION/DISCECTOMY FUSION CERVICAL TWO-THREE;  Surgeon: Coletta Memos, MD;  Location: [facility] OR;  Service: Neurosurgery;  Laterality: N/A;  ANTERIOR CERVICAL DECOMPRESSION/DISCECTOMY FUSION CERVICAL 2- CERVICAL 3   CERVICAL CONE BIOPSY     Social History   Socioeconomic History   Marital status: Single    Spouse name: Not on file   Number of children: 0   Years of education: 14   Highest education level: Not on file  Occupational History   Occupation: [employer]  Tobacco Use   Smoking status: Never   Smokeless tobacco: Never  Vaping Use   Vaping status: Never Used  Substance and Sexual Activity   Alcohol use: No    Alcohol/week: 0.0 standard drinks of alcohol   Drug use: No   Sexual activity: Not on file  Other Topics Concern   Not on file  Social History Narrative   Lives alone   Caffeine use: Drinks soda (20oz per day)   Tea sometimes   Right-handed   Social Drivers of Health   Financial Resource Strain: Low Risk  (10/12/2021)   Received from [facility], [facility]   Overall Financial Resource Strain (CARDIA)    Difficulty of Paying Living Expenses: Not very hard  Food Insecurity: No Food Insecurity (10/12/2021)   Received from [facility], [facility]   Hunger Vital Sign    Worried About Running Out of Food in the Last Year: Never true    Ran Out of Food  in the Last Year: Never true  Transportation Needs: No Transportation Needs (10/12/2021)   Received from [facility], [facility]   PRAPARE - Transportation    Lack of Transportation (Medical): No    Lack of Transportation (Non-Medical): No  Physical Activity: Not on file  Stress: Not on file  Social Connections: Not on file   Outpatient Encounter Medications as of 04/05/2023  Medication Sig   empagliflozin (JARDIANCE) 10 MG TABS tablet Take 1 tablet (10 mg total) by mouth daily before breakfast.   amLODipine (NORVASC) 5 MG tablet Take by mouth daily.   aspirin EC 81 MG tablet Take 81 mg by mouth daily.   Continuous Glucose Receiver (FREESTYLE LIBRE 3 READER) DEVI 1 Piece by Does not apply route once as needed for up to 1 dose.   Continuous Glucose Sensor (FREESTYLE LIBRE 3 PLUS SENSOR) MISC APPLY 1 SENSOR TOPICALLY ONCE EVERY 15 DAYS   cyclobenzaprine (FLEXERIL) 10 MG tablet Take 10 mg by mouth at bedtime as needed for muscle spasms.    Dulaglutide (TRULICITY) 4.5 MG/0.5ML SOAJ Inject 4.5 mg as directed once a week.   fluticasone (FLONASE) 50 MCG/ACT nasal spray Place 2 sprays into both nostrils daily.   gabapentin (NEURONTIN) 300 MG capsule Take 300 mg by mouth as needed.  ibuprofen (ADVIL) 800 MG tablet Take 1 tablet (800 mg total) by mouth every 8 (eight) hours as needed for moderate pain.   insulin degludec (TRESIBA FLEXTOUCH) 200 UNIT/ML FlexTouch Pen Inject 70 Units into the skin at bedtime.   Insulin Pen Needle 29G X MISC Use to inject insulin nightly.   lisinopril-hydrochlorothiazide (PRINZIDE,ZESTORETIC) 20-25 MG per tablet Take 1 tablet by mouth daily.   mirabegron ER (MYRBETRIQ) 50 MG TB24 tablet Take 1 tablet (50 mg total) by mouth daily.   omeprazole (PRILOSEC) 20 MG capsule Take 20 mg by mouth daily.   ONETOUCH VERIO test strip USE TO CHECK BLOOD SUGAR TWICE DAILY.   PARoxetine (PAXIL) 20 MG tablet Take 20 mg by mouth daily.   potassium chloride  (K-DUR,KLOR-CON) 10 MEQ tablet Take 10 mEq by mouth daily.   rosuvastatin (CRESTOR) 20 MG tablet TAKE 1 TABLET BY MOUTH DAILY   traMADol (ULTRAM) 50 MG tablet Take 2 tablets (100 mg total) by mouth every 6 (six) hours as needed for moderate pain.   Vitamin D, Ergocalciferol, (DRISDOL) 50000 UNITS CAPS capsule Take 50,000 Units by mouth every Monday.    [DISCONTINUED] glipiZIDE (GLUCOTROL XL) 5 MG 24 hr tablet Take 1 tablet (5 mg total) by mouth daily with breakfast.   [DISCONTINUED] insulin degludec (TRESIBA FLEXTOUCH) 200 UNIT/ML FlexTouch Pen Inject 80 Units into the skin at bedtime.   No facility-administered encounter medications on file as of 04/05/2023.   ALLERGIES: Allergies  Allergen Reactions   Metformin Diarrhea   Statins Other (See Comments)    Weak all over , muscle weakness    Sulfa Antibiotics Rash and Other (See Comments)    Gets a fever    Sulfasalazine Other (See Comments) and Rash    Gets a fever    VACCINATION STATUS: Immunization History  Administered Date(s) Administered   Moderna Covid-19 Vaccine Bivalent Booster 76yrs & up 12/16/2020   Moderna Sars-Covid-2 Vaccination 08/05/2020    Diabetes She presents for her follow-up diabetic visit. She has type 2 diabetes mellitus. Onset time: Diagnosed at approximate age of 37 yrs. Her disease course has been fluctuating. There are no hypoglycemic associated symptoms. Pertinent negatives for hypoglycemia include no confusion, headaches, pallor or seizures. There are no diabetic associated symptoms. Pertinent negatives for diabetes include no chest pain and no polyuria. Symptoms are improving. There are no diabetic complications. Risk factors for coronary artery disease include dyslipidemia, hypertension and sedentary lifestyle. Current diabetic treatment includes insulin injections (she is on Tresiba 110 units qhs, Januvia 100mg  po qam, glipizide 5 mg qam.). Her weight is fluctuating minimally. She is following a generally  unhealthy diet. When asked about meal planning, she reported none (Admits to dietary indiscretion. She drinks soda regularly.). She rarely participates in exercise. Her home blood glucose trend is fluctuating minimally. Her breakfast blood glucose range is generally 130-140 mg/dl. Her lunch blood glucose range is generally 130-140 mg/dl. Her dinner blood glucose range is generally 130-140 mg/dl. Her bedtime blood glucose range is generally 130-140 mg/dl. Her overall blood glucose range is 130-140 mg/dl. (she presents with improved glycemic profile.  Her CGM AGP report shows 80% time range, 17% level 1 hyperglycemia, 1% level 2 hyperglycemia 2% Level One hypoglycemia mostly overnight.   Her point-of-care A1c is 7.7% today.  Average blood glucose is 139 for the most recent 14 days. )  Hypertension This is a chronic problem. The current episode started more than 1 year ago. Pertinent negatives include no chest pain, headaches, palpitations or  shortness of breath. Risk factors for coronary artery disease include family history, dyslipidemia, sedentary lifestyle, diabetes mellitus and post-menopausal state. Past treatments include ACE inhibitors.  Hyperlipidemia This is a chronic problem. The current episode started more than 1 year ago. The problem is uncontrolled. Exacerbating diseases include diabetes and obesity. Pertinent negatives include no chest pain, myalgias or shortness of breath. Current antihyperlipidemic treatment includes statins. Risk factors for coronary artery disease include diabetes mellitus, dyslipidemia, hypertension, obesity, post-menopausal, a sedentary lifestyle and family history.    Review of systems  Constitutional: + Minimally fluctuating body weight,  current  Body mass index is 33.3 kg/m. , no fatigue, no subjective hyperthermia, no subjective hypothermia    Objective:    BP (!) 98/54   Pulse 76   Ht 5\' 4"  (1.626 m)   Wt 194 lb (88 kg)   BMI 33.30 kg/m   Wt Readings  from Last 3 Encounters:  04/05/23 194 lb (88 kg)  11/17/22 196 lb 9.6 oz (89.2 kg)  08/12/22 193 lb (87.5 kg)     Results for orders placed or performed in visit on 04/05/23  HgB A1c   Collection Time: 04/05/23  4:10 PM  Result Value Ref Range   Hemoglobin A1C     HbA1c POC (<> result, manual entry)     HbA1c, POC (prediabetic range)     HbA1c, POC (controlled diabetic range) 7.7 (A) 0.0 - 7.0 %   Complete Blood Count (Most recent): Lab Results  Component Value Date   WBC 9.2 03/13/2022   HGB 13.1 03/13/2022   HCT 41.4 03/13/2022   MCV 87.5 03/13/2022   PLT 314 03/13/2022   Chemistry (most recent): Lab Results  Component Value Date   NA 143 03/14/2023   K 3.9 03/14/2023   CL 100 03/14/2023   CO2 30 (H) 03/14/2023   BUN 17 03/14/2023   CREATININE 0.97 03/14/2023   Diabetic Labs (most recent): Lab Results  Component Value Date   HGBA1C 7.7 (A) 04/05/2023   HGBA1C 7.4 (A) 11/17/2022   HGBA1C 7.6 (A) 07/14/2022   MICROALBUR 0.9 06/29/2019   MICROALBUR 11 09/28/2017   MICROALBUR 1.2 08/04/2016   Lipid Panel     Component Value Date/Time   CHOL 137 06/25/2022 0911   TRIG 90 06/25/2022 0911   HDL 39 (L) 06/25/2022 0911   CHOLHDL 3.5 06/25/2022 0911   CHOLHDL 4.9 06/29/2019 0819   VLDL 36 (H) 08/04/2016 1157   LDLCALC 81 06/25/2022 0911   LDLCALC 142 (H) 06/29/2019 0819     Assessment & Plan:   1. Uncontrolled type 2 diabetes mellitus without complication, with long-term current use of insulin (HCC)  she presents with improved glycemic profile.  Her CGM AGP report shows 80% time range, 17% level 1 hyperglycemia, 1% level 2 hyperglycemia 2% Level One hypoglycemia mostly overnight.   Her point-of-care A1c is 7.7% today.  Average blood glucose is 139 for the most recent 14 days.   Recent labs reviewed. Patient remains at a high risk for more acute and chronic complications of diabetes which include CAD, CVA, CKD, retinopathy, and neuropathy. These are all  discussed in detail with the patient. I have re-counseled the patient on diet management and weight loss, by adopting a carbohydrate restricted / protein rich  Diet. This patient will benefit from Lifestyle Medicine, especially given her currently uncontrolled dyslipidemia.  - she acknowledges that there is a room for improvement in her food and drink choices. - Suggestion is made for  her to avoid simple carbohydrates  from her diet including Cakes, Sweet Desserts, Ice Cream, Soda (diet and regular), Sweet Tea, Candies, Chips, Cookies, Store Bought Juices, Alcohol , Artificial Sweeteners,  Coffee Creamer, and "Sugar-free" Products, Lemonade. This will help patient to have more stable blood glucose profile and potentially avoid unintended weight gain.  The following Lifestyle Medicine recommendations according to American College of Lifestyle Medicine  [facility]) were discussed and and offered to patient and she  agrees to start the journey:  A. Whole Foods, Plant-Based Nutrition comprising of fruits and vegetables, plant-based proteins, whole-grain carbohydrates was discussed in detail with the patient.   A list for source of those nutrients were also provided to the patient.  Patient will use only water or unsweetened tea for hydration. B.  The need to stay away from risky substances including alcohol, smoking; obtaining 7 to 9 hours of restorative sleep, at least 150 minutes of moderate intensity exercise weekly, the importance of healthy social connections,  and stress management techniques were discussed. C.  A full color page of  Calorie density of various food groups per pound showing examples of each food groups was provided to the patient.  -She has tolerated her Trulicity.  She is advised to continue Trulicity to 4.5 mg subcutaneously weekly. -In order to avoid hypoglycemia, she is advised to lower her Tresiba to 70 units nightly.   -She is encouraged to use her CGM continuously. -She would  benefit from SGLT2 inhibitors over glipizide at this time.  I discussed and prescribed Jardiance 10 mg p.o. daily at breakfast.  Side effects and precautions discussed with her.  She is advised to discontinue glipizide.  She does not tolerate metformin .  -Patient is encouraged to call clinic for blood glucose levels less than 70 or above 200 mg /dl at fasting.   2) HTN: -Her blood pressure is controlled. -She is advised to continue her amlodipine 5 mg p.o. daily, her lisinopril/HCTZ this morning.    3) HPL: Her most recent lipid panel showed significant improvement in her lipid panel with LDL at 81, progressively improving from 235.   She is responding favorably to Crestor.  She is advised to continue Crestor 20 mg p.o. nightly.   Her engagement is suboptimal, however this patient will continue to benefit from whole food plant-based diet as discussed above.    4.  Weight management: Her BMI is 33.30---  She is a candidate for modest weight loss.  I discussed the fact that-10% weight loss will help significantly impact on her diabetes care.  5)  Chronic Care: -Patient is on ACEI and Statin medications and encouraged to continue to follow up with Ophthalmology, Podiatrist at least yearly or according to recommendations, and advised to stay away from smoking. I have recommended yearly flu vaccine and pneumonia vaccination at least every 5 years; moderate intensity exercise for up to 150 minutes weekly; and  sleep for at least 7 hours a day.   Her screening ABI was negative for PAD in February 2022.  This study will be repeated in February 2027, or sooner if needed.   She is advised to maintain close follow-up with her PMD Sundra Aland, MD   I spent  26  minutes in the care of the patient today including review of labs from CMP, Lipids, Thyroid Function, Hematology (current and previous including abstractions from other facilities); face-to-face time discussing  her blood glucose  readings/logs, discussing hypoglycemia and hyperglycemia episodes and symptoms, medications doses,  her options of short and long term treatment based on the latest standards of care / guidelines;  discussion about incorporating lifestyle medicine;  and documenting the encounter. Risk reduction counseling performed per USPSTF guidelines to reduce  obesity and cardiovascular risk factors.     Please refer to Patient Instructions for Blood Glucose Monitoring and Insulin/Medications Dosing Guide"  in media tab for additional information. Please  also refer to " Patient Self Inventory" in the Media  tab for reviewed elements of pertinent patient history.  Rebecca Roberson participated in the discussions, expressed understanding, and voiced agreement with the above plans.  All questions were answered to her satisfaction. she is encouraged to contact clinic should she have any questions or concerns prior to her return visit.    Follow up plan: Return in about 6 months (around 10/06/2023) for F/U with Pre-visit Labs, Meter/CGM/Logs, A1c here.  Marquis Lunch, MD Phone: [phone]  Fax: [phone]  -  This note was partially dictated with voice recognition software. Similar sounding words can be transcribed inadequately or may not  be corrected upon review.  04/05/2023, 4:27 PM

## 2023-04-05 NOTE — Patient Instructions (Signed)

## 2023-04-14 ENCOUNTER — Encounter: Payer: Self-pay | Admitting: Urology

## 2023-04-14 ENCOUNTER — Ambulatory Visit: Payer: BC Managed Care – PPO | Admitting: Urology

## 2023-04-14 VITALS — BP 114/66 | HR 84

## 2023-04-14 DIAGNOSIS — N200 Calculus of kidney: Secondary | ICD-10-CM

## 2023-04-14 DIAGNOSIS — N3941 Urge incontinence: Secondary | ICD-10-CM

## 2023-04-14 DIAGNOSIS — N3281 Overactive bladder: Secondary | ICD-10-CM

## 2023-04-14 LAB — URINALYSIS, ROUTINE W REFLEX MICROSCOPIC
Bilirubin, UA: NEGATIVE
Ketones, UA: NEGATIVE
Leukocytes,UA: NEGATIVE
Nitrite, UA: NEGATIVE
Protein,UA: NEGATIVE
RBC, UA: NEGATIVE
Specific Gravity, UA: 1.02 (ref 1.005–1.030)
Urobilinogen, Ur: 4 mg/dL — ABNORMAL HIGH (ref 0.2–1.0)
pH, UA: 6 (ref 5.0–7.5)

## 2023-04-14 MED ORDER — MIRABEGRON ER 50 MG PO TB24: 50.0000 mg | ORAL_TABLET | Freq: Every day | ORAL | 3 refills | Status: AC

## 2023-04-14 NOTE — Progress Notes (Unsigned)
 Subjective:  1. Calculus, renal   2. Urge incontinence     I have kidney stones. HPI: Rebecca Roberson is a 73 year-old female established patient who is here for renal calculi.   Rebecca Roberson returns today in f/u for her history of stones. She had a KUB on 08/28/19 and was found to have a stable small RUP stone.  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    02/25/2017: No stone events since last visit. KUB shows stable 5mm right upper pole calculus.   12/04/20: Rebecca Roberson returns today in f/u for her history of stones.   She has had no worrisome symptoms and her UA is unremarkable.  She last had a KUB in 8/21 and had a stable 5mm RUP stone.  She has OAB wet and was seen in [place] and she has been on Detrol LA 4mg  for some time and is currently on solifenacin 5mg  daily which has helped her nocturia and incontinence.   The Myrbetriq and Leslye Peer were too expensive.    12/10/21: Rebecca Roberson returns today in f/u for her history of stones and OAB wet.   A KUB on 11/14 shows a stable 5mm RUP stone.  She has been on tolteradine and solifenacin for the OAB.  She has been having some pain in the LLQ for the last 3 weeks.   She is very constipated.  She has had no hematuria.  She had some diverticuli on a CT in 2016.  She has no fever.   04/14/23: Rebecca Roberson returns today in f/u for her history of stones and OAB wet.  A KUB prior to this visit shows a 6mm calcification over the RUP. Which is minimally changed since 11/23.  She has some lower abdominal pain.  She remains on Myrbetriq 50mg  for her OAB wet.  She has less urgency but can have some UUI.  She isn't wearing pads. Her UA is clear.       ROS:  ROS:  A complete review of systems was performed.  All systems are negative except for pertinent findings as noted.   Review of Systems  Constitutional:  Positive for malaise/fatigue.  HENT:  Positive for congestion.   Gastrointestinal:  Positive for heartburn.  Musculoskeletal:  Positive for back pain and  joint pain.  Neurological:  Positive for dizziness, weakness and headaches.    Allergies  Allergen Reactions   Metformin Diarrhea   Statins Other (See Comments)    Weak all over , muscle weakness    Sulfa Antibiotics Rash and Other (See Comments)    Gets a fever    Sulfasalazine Other (See Comments) and Rash    Gets a fever     Outpatient Encounter Medications as of 04/14/2023  Medication Sig   aspirin EC 81 MG tablet Take 81 mg by mouth daily.   Continuous Glucose Receiver (FREESTYLE LIBRE 3 READER) DEVI 1 Piece by Does not apply route once as needed for up to 1 dose.   Continuous Glucose Sensor (FREESTYLE LIBRE 3 PLUS SENSOR) MISC APPLY 1 SENSOR TOPICALLY ONCE EVERY 15 DAYS   cyclobenzaprine (FLEXERIL) 10 MG tablet Take 10 mg by mouth at bedtime as needed for muscle spasms.    Dulaglutide (TRULICITY) 4.5 MG/0.5ML SOAJ Inject 4.5 mg as directed once a week.   empagliflozin (JARDIANCE) 10 MG TABS tablet Take 1 tablet (10 mg total) by mouth daily before breakfast.   fluticasone (FLONASE) 50 MCG/ACT nasal spray Place 2 sprays into both nostrils daily.  gabapentin (NEURONTIN) 300 MG capsule Take 300 mg by mouth as needed.    ibuprofen (ADVIL) 800 MG tablet Take 1 tablet (800 mg total) by mouth every 8 (eight) hours as needed for moderate pain.   insulin degludec (TRESIBA FLEXTOUCH) 200 UNIT/ML FlexTouch Pen Inject 70 Units into the skin at bedtime.   Insulin Pen Needle 29G X MISC Use to inject insulin nightly.   lisinopril-hydrochlorothiazide (PRINZIDE,ZESTORETIC) 20-25 MG per tablet Take 1 tablet by mouth daily.   omeprazole (PRILOSEC) 20 MG capsule Take 20 mg by mouth daily.   ONETOUCH VERIO test strip USE TO CHECK BLOOD SUGAR TWICE DAILY.   PARoxetine (PAXIL) 20 MG tablet Take 20 mg by mouth daily.   potassium chloride (K-DUR,KLOR-CON) 10 MEQ tablet Take 10 mEq by mouth daily.   rosuvastatin (CRESTOR) 20 MG tablet TAKE 1 TABLET BY MOUTH DAILY   traMADol (ULTRAM) 50 MG tablet  Take 2 tablets (100 mg total) by mouth every 6 (six) hours as needed for moderate pain.   Vitamin D, Ergocalciferol, (DRISDOL) 50000 UNITS CAPS capsule Take 50,000 Units by mouth every Monday.    [DISCONTINUED] mirabegron ER (MYRBETRIQ) 50 MG TB24 tablet Take 1 tablet (50 mg total) by mouth daily.   amLODipine (NORVASC) 5 MG tablet Take by mouth daily.   mirabegron ER (MYRBETRIQ) 50 MG TB24 tablet Take 1 tablet (50 mg total) by mouth daily.   No facility-administered encounter medications on file as of 04/14/2023.    Past Medical History:  Diagnosis Date   Arthritis    Cervical myelopathy (HCC) 06/30/2016   Diabetes mellitus without complication (HCC)    GERD (gastroesophageal reflux disease)    Heart murmur    History of kidney stones    has one now   Hyperlipidemia    Hypertension    Sleep apnea    cpap have not used 3 yrs    Past Surgical History:  Procedure Laterality Date   ANTERIOR CERVICAL DECOMP/DISCECTOMY FUSION N/A 07/30/2016   Procedure: ANTERIOR CERVICAL DECOMPRESSION/DISCECTOMY FUSION CERVICAL TWO-THREE;  Surgeon: Coletta Memos, MD;  Location: [facility] OR;  Service: Neurosurgery;  Laterality: N/A;  ANTERIOR CERVICAL DECOMPRESSION/DISCECTOMY FUSION CERVICAL 2- CERVICAL 3   CERVICAL CONE BIOPSY      Social History   Socioeconomic History   Marital status: Single    Spouse name: Not on file   Number of children: 0   Years of education: 14   Highest education level: Not on file  Occupational History   Occupation: [employer]  Tobacco Use   Smoking status: Never   Smokeless tobacco: Never  Vaping Use   Vaping status: Never Used  Substance and Sexual Activity   Alcohol use: No    Alcohol/week: 0.0 standard drinks of alcohol   Drug use: No   Sexual activity: Not on file  Other Topics Concern   Not on file  Social History Narrative   Lives alone   Caffeine use: Drinks soda (20oz per day)   Tea sometimes   Right-handed   Social Drivers of Health    Financial Resource Strain: Low Risk  (10/12/2021)   Received [facility], [facility]   Overall Financial Resource Strain (CARDIA)    Difficulty of Paying Living Expenses: Not very hard  Food Insecurity: No Food Insecurity (10/12/2021)   Received from [facility], [facility]   Hunger Vital Sign    Worried About Running Out of Food in the Last Year: Never true  Ran Out of Food in the Last Year: Never true  Transportation Needs: No Transportation Needs (10/12/2021)   Received from [facility], [facility]   [facility] - Transportation    Lack of Transportation (Medical): No    Lack of Transportation (Non-Medical): No  Physical Activity: Not on file  Stress: Not on file  Social Connections: Not on file  Intimate Partner Violence: Not At Risk (01/04/2022)   Received from [facility], [facility]   Humiliation, Afraid, Rape, and Kick questionnaire    Fear of Current or Ex-Partner: No    Emotionally Abused: No    Physically Abused: No    Sexually Abused: No    Family History  Problem Relation Age of Onset   Kidney disease Father    Thyroid disease Father        Objective: Vitals:   04/14/23 1509  BP: 114/66  Pulse: 84      Physical Exam Vitals reviewed.  Constitutional:      Appearance: Normal appearance.  Neurological:     Mental Status: She is alert.     Lab Results:  Results for orders placed or performed in visit on 04/14/23 (from the past 24 hours)  Urinalysis, Routine w reflex microscopic     Status: Abnormal   Collection Time: 04/14/23  3:09 PM  Result Value Ref Range   Specific Gravity, UA 1.020 1.005 - 1.030   pH, UA 6.0 5.0 - 7.5   Color, UA Yellow Yellow   Appearance Ur Clear Clear   Leukocytes,UA Negative Negative   Protein,UA Negative Negative/Trace   Glucose, UA 3+ (A) Negative   Ketones, UA Negative Negative   RBC, UA Negative Negative   Bilirubin, UA Negative Negative   Urobilinogen, Ur 4.0 (H) 0.2 -  1.0 mg/dL   Nitrite, UA Negative Negative   Microscopic Examination Comment    Narrative   Performed at:  [address] [facility] [address] [place], [place]  [id number] Lab Director: Chinita Pester MT, Phone:  [phone]       BMET No results for input(s): "NA", "K", "CL", "CO2", "GLUCOSE", "BUN", "CREATININE", "CALCIUM" in the last 72 hours. PSA No results found for: "PSA" No results found for: "TESTOSTERONE"  UA is unremarkable.   Studies/Results: No results found. DG Abd 1 View Result Date: 03/27/2023 CLINICAL DATA:  Nephrolithiasis EXAM: ABDOMEN - 1 VIEW COMPARISON:  CT abdomen pelvis 03/13/2022 FINDINGS: 6 mm calcific density projecting over the expected location of the superior pole right kidney, compatible with nephrolithiasis. Stool throughout the colon. Nonobstructed bowel-gas pattern. Calcified fibroid projects over the pelvis. Lumbar spine degenerative changes. IMPRESSION: 6 mm calcific density projecting over the expected location of the superior pole right kidney, compatible with nephrolithiasis. Electronically Signed   By: Annia Belt M.D.   On: 03/27/2023 17:31       Assessment & Plan: Right upper pole renal stone.  The stone is stable.  KUB in a year.   OAB wet doing well  on Myrbetriq 50mg  which I refilled.  .      Meds ordered this encounter  Medications   mirabegron ER (MYRBETRIQ) 50 MG TB24 tablet    Sig: Take 1 tablet (50 mg total) by mouth daily.    Dispense:  90 tablet    Refill:  3     Orders Placed This Encounter  Procedures   DG Abd 1 View    Standing Status:   Future    Expiration  Date:   04/13/2024    Reason for Exam (SYMPTOM  OR DIAGNOSIS REQUIRED):   right renal stone    Preferred imaging location?:   [facility]    Radiology Contrast Protocol - do NOT remove file path:   [email or web address]   Urinalysis, Routine w reflex microscopic      Return in about 1 year  (around 04/13/2024) for with a KUB.Marland Kitchen  Any available provider. .   CC: Catalina Lunger, DO      Bjorn Pippin 04/15/2023 Patient ID: Rebecca Roberson, female   DOB: 1950-03-15, 73 y.o.   MRN: [id number]

## 2023-04-17 ENCOUNTER — Other Ambulatory Visit: Payer: Self-pay | Admitting: "Endocrinology

## 2023-04-17 DIAGNOSIS — E1165 Type 2 diabetes mellitus with hyperglycemia: Secondary | ICD-10-CM

## 2023-05-31 ENCOUNTER — Other Ambulatory Visit: Payer: Self-pay | Admitting: "Endocrinology

## 2023-05-31 DIAGNOSIS — E1165 Type 2 diabetes mellitus with hyperglycemia: Secondary | ICD-10-CM

## 2023-07-10 ENCOUNTER — Other Ambulatory Visit: Payer: Self-pay | Admitting: "Endocrinology

## 2023-08-25 ENCOUNTER — Other Ambulatory Visit: Payer: Self-pay | Admitting: "Endocrinology

## 2023-08-25 DIAGNOSIS — E1165 Type 2 diabetes mellitus with hyperglycemia: Secondary | ICD-10-CM

## 2023-09-16 LAB — LIPID PANEL
Chol/HDL Ratio: 3.9 ratio (ref 0.0–4.4)
Cholesterol, Total: 160 mg/dL (ref 100–199)
HDL: 41 mg/dL (ref >39)
LDL Chol Calc (NIH): 100 mg/dL — ABNORMAL HIGH (ref 0–99)
Triglycerides: 100 mg/dL (ref 0–149)
VLDL Cholesterol Cal: 19 mg/dL (ref 5–40)

## 2023-09-25 ENCOUNTER — Other Ambulatory Visit: Payer: Self-pay | Admitting: "Endocrinology

## 2023-09-27 ENCOUNTER — Telehealth: Payer: Self-pay | Admitting: Urology

## 2023-09-27 NOTE — Telephone Encounter (Signed)
 Returned call with no answer. Voicemail is full.

## 2023-09-27 NOTE — Telephone Encounter (Signed)
 Previous pt of Dr Watt , wants to stay at this office , she was being treated for kidney stones. She is having a lot of pain and she thinks they may be moving, please advise, does she need to have a xray before she comes in for appt with [facility] 11/02/23

## 2023-09-28 ENCOUNTER — Other Ambulatory Visit: Payer: Self-pay

## 2023-09-28 ENCOUNTER — Other Ambulatory Visit: Payer: Self-pay | Admitting: "Endocrinology

## 2023-09-28 DIAGNOSIS — E1165 Type 2 diabetes mellitus with hyperglycemia: Secondary | ICD-10-CM

## 2023-09-28 MED ORDER — EMPAGLIFLOZIN 10 MG PO TABS: 10.0000 mg | ORAL_TABLET | Freq: Every day | ORAL | 0 refills | Status: DC

## 2023-10-10 ENCOUNTER — Ambulatory Visit: Admitting: "Endocrinology

## 2023-10-10 ENCOUNTER — Encounter: Payer: Self-pay | Admitting: "Endocrinology

## 2023-10-10 VITALS — BP 124/64 | HR 80 | Ht 64.0 in | Wt 195.2 lb

## 2023-10-10 DIAGNOSIS — E6609 Other obesity due to excess calories: Secondary | ICD-10-CM

## 2023-10-10 DIAGNOSIS — E1165 Type 2 diabetes mellitus with hyperglycemia: Secondary | ICD-10-CM | POA: Diagnosis not present

## 2023-10-10 DIAGNOSIS — I10 Essential (primary) hypertension: Secondary | ICD-10-CM | POA: Diagnosis not present

## 2023-10-10 DIAGNOSIS — E66811 Obesity, class 1: Secondary | ICD-10-CM

## 2023-10-10 DIAGNOSIS — Z794 Long term (current) use of insulin: Secondary | ICD-10-CM | POA: Diagnosis not present

## 2023-10-10 DIAGNOSIS — E782 Mixed hyperlipidemia: Secondary | ICD-10-CM | POA: Diagnosis not present

## 2023-10-10 DIAGNOSIS — Z6833 Body mass index (BMI) 33.0-33.9, adult: Secondary | ICD-10-CM

## 2023-10-10 LAB — POCT GLYCOSYLATED HEMOGLOBIN (HGB A1C)

## 2023-10-10 MED ORDER — INSULIN DEGLUDEC FLEXTOUCH 200 UNIT/ML ~~LOC~~ SOPN: 80.0000 [IU] | PEN_INJECTOR | Freq: Every day | SUBCUTANEOUS | 1 refills | Status: DC

## 2023-10-10 MED ORDER — EMPAGLIFLOZIN 25 MG PO TABS: 25.0000 mg | ORAL_TABLET | Freq: Every day | ORAL | 1 refills | Status: DC

## 2023-10-10 MED ORDER — ROSUVASTATIN CALCIUM 40 MG PO TABS: 40.0000 mg | ORAL_TABLET | Freq: Every day | ORAL | 1 refills | Status: AC

## 2023-10-10 MED ORDER — EMPAGLIFLOZIN 25 MG PO TABS: 25.0000 mg | ORAL_TABLET | Freq: Every day | ORAL | 1 refills | Status: AC

## 2023-10-10 NOTE — Patient Instructions (Signed)

## 2023-10-10 NOTE — Progress Notes (Signed)
 10/10/2023   Endocrinology follow-up note   Subjective:    Patient ID: Rebecca Roberson, female    DOB: 12/19/1950,    Past Medical History:  Diagnosis Date   Arthritis    Cervical myelopathy (HCC) 06/30/2016   Diabetes mellitus without complication (HCC)    GERD (gastroesophageal reflux disease)    Heart murmur    History of kidney stones    has one now   Hyperlipidemia    Hypertension    Sleep apnea    cpap have not used 3 yrs   Past Surgical History:  Procedure Laterality Date   ANTERIOR CERVICAL DECOMP/DISCECTOMY FUSION N/A 07/30/2016   Procedure: ANTERIOR CERVICAL DECOMPRESSION/DISCECTOMY FUSION CERVICAL TWO-THREE;  Surgeon: Gillie Duncans, MD;  Location: [facility] OR;  Service: Neurosurgery;  Laterality: N/A;  ANTERIOR CERVICAL DECOMPRESSION/DISCECTOMY FUSION CERVICAL 2- CERVICAL 3   CERVICAL CONE BIOPSY     Social History   Socioeconomic History   Marital status: Single    Spouse name: Not on file   Number of children: 0   Years of education: 14   Highest education level: Not on file  Occupational History   Occupation: [employer]  Tobacco Use   Smoking status: Never   Smokeless tobacco: Never  Vaping Use   Vaping status: Never Used  Substance and Sexual Activity   Alcohol use: No    Alcohol/week: 0.0 standard drinks of alcohol   Drug use: No   Sexual activity: Not on file  Other Topics Concern   Not on file  Social History Narrative   Lives alone   Caffeine use: Drinks soda (20oz per day)   Tea sometimes   Right-handed   Social Drivers of Health   Financial Resource Strain: Low Risk  (10/12/2021)   Received from [facility]   Overall Financial Resource Strain (CARDIA)    Difficulty of Paying Living Expenses: Not very hard  Food Insecurity: No Food Insecurity (10/12/2021)   Received from [facility]   Hunger Vital Sign    Within the past 12 months, you worried that your food would run out before you got the money to buy more.: Never true     Within the past 12 months, the food you bought just didn't last and you didn't have money to get more.: Never true  Transportation Needs: No Transportation Needs (10/12/2021)   Received from [facility]   PRAPARE - Transportation    Lack of Transportation (Medical): No    Lack of Transportation (Non-Medical): No  Physical Activity: Not on file  Stress: Not on file  Social Connections: Not on file   Outpatient Encounter Medications as of 10/10/2023  Medication Sig   [DISCONTINUED] Insulin  Degludec FlexTouch 200 UNIT/ML SOPN Inject 70 Units into the skin at bedtime. (Patient taking differently: Inject 80 Units into the skin at bedtime.)   amLODipine (NORVASC) 5 MG tablet Take by mouth daily.   aspirin  EC 81 MG tablet Take 81 mg by mouth daily.   Continuous Glucose Receiver (FREESTYLE LIBRE 3 READER) DEVI 1 Piece by Does not apply route once as needed for up to 1 dose.   Continuous Glucose Sensor (FREESTYLE LIBRE 3 PLUS SENSOR) MISC APPLY 1 PATCH TOPICALLY EVERY 15 DAYS   cyclobenzaprine (FLEXERIL) 10 MG tablet Take 10 mg by mouth at bedtime as needed for muscle spasms.    empagliflozin  (JARDIANCE ) 25 MG TABS tablet Take 1 tablet (25 mg total) by mouth daily before breakfast.   fluticasone  (FLONASE ) 50  MCG/ACT nasal spray Place 2 sprays into both nostrils daily.   gabapentin  (NEURONTIN ) 300 MG capsule Take 300 mg by mouth as needed.    ibuprofen  (ADVIL ) 800 MG tablet Take 1 tablet (800 mg total) by mouth every 8 (eight) hours as needed for moderate pain.   Insulin  Degludec FlexTouch 200 UNIT/ML SOPN Inject 80 Units into the skin at bedtime.   Insulin  Pen Needle 29G X MISC Use to inject insulin  nightly.   lisinopril -hydrochlorothiazide  (PRINZIDE ,ZESTORETIC ) 20-25 MG per tablet Take 1 tablet by mouth daily.   mirabegron  ER (MYRBETRIQ ) 50 MG TB24 tablet Take 1 tablet (50 mg total) by mouth daily.   omeprazole (PRILOSEC) 20 MG capsule Take 20 mg by mouth daily.   ONETOUCH VERIO test strip  USE TO CHECK BLOOD SUGAR TWICE DAILY.   PARoxetine  (PAXIL ) 20 MG tablet Take 20 mg by mouth daily.   potassium chloride  (K-DUR,KLOR-CON ) 10 MEQ tablet Take 10 mEq by mouth daily.   rosuvastatin  (CRESTOR ) 40 MG tablet Take 1 tablet (40 mg total) by mouth daily.   traMADol  (ULTRAM ) 50 MG tablet Take 2 tablets (100 mg total) by mouth every 6 (six) hours as needed for moderate pain.   TRULICITY  4.5 MG/0.5ML SOAJ INJECT 4.5 MG  SUBCUTANEOUSLY ONCE A WEEK   Vitamin D , Ergocalciferol , (DRISDOL ) 50000 UNITS CAPS capsule Take 50,000 Units by mouth every Monday.    [DISCONTINUED] empagliflozin  (JARDIANCE ) 10 MG TABS tablet Take 1 tablet (10 mg total) by mouth daily before breakfast.   [DISCONTINUED] empagliflozin  (JARDIANCE ) 25 MG TABS tablet Take 1 tablet (25 mg total) by mouth daily before breakfast.   [DISCONTINUED] rosuvastatin  (CRESTOR ) 20 MG tablet TAKE 1 TABLET BY MOUTH DAILY   No facility-administered encounter medications on file as of 10/10/2023.   ALLERGIES: Allergies  Allergen Reactions   Metformin Diarrhea   Statins Other (See Comments)    Weak all over , muscle weakness    Sulfa Antibiotics Rash and Other (See Comments)    Gets a fever    Sulfasalazine Other (See Comments) and Rash    Gets a fever    VACCINATION STATUS: Immunization History  Administered Date(s) Administered   Moderna Covid-19 Vaccine Bivalent Booster 22yrs & up 12/16/2020   Moderna Sars-Covid-2 Vaccination 08/05/2020    Diabetes She presents for her follow-up diabetic visit. She has type 2 diabetes mellitus. Onset time: Diagnosed at approximate age of 44 yrs. Her disease course has been worsening. There are no hypoglycemic associated symptoms. Pertinent negatives for hypoglycemia include no confusion, headaches, pallor or seizures. There are no diabetic associated symptoms. Pertinent negatives for diabetes include no chest pain and no polyuria. Symptoms are worsening. There are no diabetic complications. Risk  factors for coronary artery disease include dyslipidemia, hypertension and sedentary lifestyle. Current diabetic treatment includes insulin  injections (she is on Tresiba  110 units qhs, Januvia  100mg  po qam, glipizide  5 mg qam.). Her weight is fluctuating minimally. She is following a generally unhealthy diet. When asked about meal planning, she reported none (Admits to dietary indiscretion. She drinks soda regularly.). She rarely participates in exercise. Her home blood glucose trend is increasing steadily. Her breakfast blood glucose range is generally 140-180 mg/dl. Her lunch blood glucose range is generally 140-180 mg/dl. Her dinner blood glucose range is generally 140-180 mg/dl. Her bedtime blood glucose range is generally 140-180 mg/dl. Her overall blood glucose range is 140-180 mg/dl. (She presents with stable bacteria above target glycemic profile.  Her average blood glucose is 171 mg per DL for  the most recent 14 days.  Her CGM shows 60% time in range, 29% Level 1 hyperglycemia, 11% level  to hyperglycemia.  She has no hypoglycemia.  Her point-of-care A1c 7.7%.)  Hypertension This is a chronic problem. The current episode started more than 1 year ago. Pertinent negatives include no chest pain, headaches, palpitations or shortness of breath. Risk factors for coronary artery disease include family history, dyslipidemia, sedentary lifestyle, diabetes mellitus and post-menopausal state. Past treatments include ACE inhibitors.  Hyperlipidemia This is a chronic problem. The current episode started more than 1 year ago. The problem is uncontrolled. Exacerbating diseases include diabetes and obesity. Pertinent negatives include no chest pain, myalgias or shortness of breath. Current antihyperlipidemic treatment includes statins. Risk factors for coronary artery disease include diabetes mellitus, dyslipidemia, hypertension, obesity, post-menopausal, a sedentary lifestyle and family history.    Review of  systems  Constitutional: + Minimally fluctuating body weight,  current  Body mass index is 33.51 kg/m. , no fatigue, no subjective hyperthermia, no subjective hypothermia    Objective:    BP 124/64   Pulse 80   Ht 5' 4 (1.626 m)   Wt 195 lb 3.2 oz (88.5 kg)   BMI 33.51 kg/m   Wt Readings from Last 3 Encounters:  10/10/23 195 lb 3.2 oz (88.5 kg)  04/05/23 194 lb (88 kg)  11/17/22 196 lb 9.6 oz (89.2 kg)     Results for orders placed or performed in visit on 04/14/23  Urinalysis, Routine w reflex microscopic   Collection Time: 04/14/23  3:09 PM  Result Value Ref Range   Specific Gravity, UA 1.020 1.005 - 1.030   pH, UA 6.0 5.0 - 7.5   Color, UA Yellow Yellow   Appearance Ur Clear Clear   Leukocytes,UA Negative Negative   Protein,UA Negative Negative/Trace   Glucose, UA 3+ (A) Negative   Ketones, UA Negative Negative   RBC, UA Negative Negative   Bilirubin, UA Negative Negative   Urobilinogen, Ur 4.0 (H) 0.2 - 1.0 mg/dL   Nitrite, UA Negative Negative   Microscopic Examination Comment    Complete Blood Count (Most recent): Lab Results  Component Value Date   WBC 9.2 03/13/2022   HGB 13.1 03/13/2022   HCT 41.4 03/13/2022   MCV 87.5 03/13/2022   PLT 314 03/13/2022   Chemistry (most recent): Lab Results  Component Value Date   NA 143 03/14/2023   K 3.9 03/14/2023   CL 100 03/14/2023   CO2 30 (H) 03/14/2023   BUN 17 03/14/2023   CREATININE 0.97 03/14/2023   Diabetic Labs (most recent): Lab Results  Component Value Date   HGBA1C 7.7 (A) 04/05/2023   HGBA1C 7.4 (A) 11/17/2022   HGBA1C 7.6 (A) 07/14/2022   MICROALBUR 0.9 06/29/2019   MICROALBUR 11 09/28/2017   MICROALBUR 1.2 08/04/2016   Lipid Panel     Component Value Date/Time   CHOL 160 09/15/2023 1118   TRIG 100 09/15/2023 1118   HDL 41 09/15/2023 1118   CHOLHDL 3.9 09/15/2023 1118   CHOLHDL 4.9 06/29/2019 0819   VLDL 36 (H) 08/04/2016 1157   LDLCALC 100 (H) 09/15/2023 1118   LDLCALC 142 (H)  06/29/2019 0819     Assessment & Plan:   1. Uncontrolled type 2 diabetes mellitus without complication, with long-term current use of insulin  (HCC)  She presents with stable bacteria above target glycemic profile.  Her average blood glucose is 171 mg per DL for the most recent 14 days.  Her CGM shows  60% time in range, 29% Level 1 hyperglycemia, 11% level  to hyperglycemia.  She has no hypoglycemia.  Her point-of-care A1c 7.7%.   Recent labs reviewed. Patient remains at a high risk for more acute and chronic complications of diabetes which include CAD, CVA, CKD, retinopathy, and neuropathy. These are all discussed in detail with the patient. I have re-counseled the patient on diet management and weight loss, by adopting a carbohydrate restricted / protein rich  Diet. This patient will benefit from Lifestyle Medicine, especially given her currently uncontrolled dyslipidemia.  - she acknowledges that there is a room for improvement in her food and drink choices. - Suggestion is made for her to avoid simple carbohydrates  from her diet including Cakes, Sweet Desserts, Ice Cream, Soda (diet and regular), Sweet Tea, Candies, Chips, Cookies, Store Bought Juices, Alcohol , Artificial Sweeteners,  Coffee Creamer, and Sugar-free Products, Lemonade. This will help patient to have more stable blood glucose profile and potentially avoid unintended weight gain.  The following Lifestyle Medicine recommendations according to American College of Lifestyle Medicine  [facility]) were discussed and and offered to patient and she  agrees to start the journey:  A. Whole Foods, Plant-Based Nutrition comprising of fruits and vegetables, plant-based proteins, whole-grain carbohydrates was discussed in detail with the patient.   A list for source of those nutrients were also provided to the patient.  Patient will use only water or unsweetened tea for hydration. B.  The need to stay away from risky substances including  alcohol, smoking; obtaining 7 to 9 hours of restorative sleep, at least 150 minutes of moderate intensity exercise weekly, the importance of healthy social connections,  and stress management techniques were discussed. C.  A full color page of  Calorie density of various food groups per pound showing examples of each food groups was provided to the patient.   -She has tolerated her Trulicity .  She is advised to continue Trulicity  4.5 mg subcutaneously weekly.  Side effects and precaution discussed with her.    -She is advised to continue Tresiba  80 units nightly. -She is encouraged to use her CGM continuously. -She is benefiting from SGLT2 inhibitors.  I discussed and increase her Jardiance  to 25 mg p.o. daily at breakfast.   Side effects and precautions discussed with her.  She is advised to discontinue glipizide .  She does not tolerate metformin .  -Patient is encouraged to call clinic for blood glucose levels less than 70 or above 180 mg /dl weight the average.    2) HTN: Her blood pressure is controlled to target. -She is advised to continue her amlodipine 5 mg p.o. daily, her lisinopril /HCTZ this morning.    3) HPL: Her most recent lipid panel showed increased LDL to 100 from 81.  She has however improved her LDL from 235 . I discussed and increase her Crestor  to 40 mg p.o. nightly.  She will be considered for additional intervention with ezetimibe next visit if LDL remains above 70 mg per DL. 4.  Weight management: Her BMI is 33.51- She is a candidate for modest weight loss.  I discussed the fact that-10% weight loss will help significantly impact on her diabetes care.  5)  Chronic Care: -Patient is on ACEI and Statin medications and encouraged to continue to follow up with Ophthalmology, Podiatrist at least yearly or according to recommendations, and advised to stay away from smoking. I have recommended yearly flu vaccine and pneumonia vaccination at least every 5 years; moderate  intensity exercise for up to 150 minutes weekly; and  sleep for at least 7 hours a day.   Her screening ABI was negative for PAD in February 2022.  This study will be repeated in February 2027, or sooner if needed.   She is advised to maintain close follow-up with her PMD Torrence Barrier, MD  I spent  26  minutes in the care of the patient today including review of labs from CMP, Lipids, Thyroid Function, Hematology (current and previous including abstractions from other facilities); face-to-face time discussing  her blood glucose readings/logs, discussing hypoglycemia and hyperglycemia episodes and symptoms, medications doses, her options of short and long term treatment based on the latest standards of care / guidelines;  discussion about incorporating lifestyle medicine;  and documenting the encounter. Risk reduction counseling performed per USPSTF guidelines to reduce  obesity and cardiovascular risk factors.     Please refer to Patient Instructions for Blood Glucose Monitoring and Insulin /Medications Dosing Guide  in media tab for additional information. Please  also refer to  Patient Self Inventory in the Media  tab for reviewed elements of pertinent patient history.  Rebecca Roberson participated in the discussions, expressed understanding, and voiced agreement with the above plans.  All questions were answered to her satisfaction. she is encouraged to contact clinic should she have any questions or concerns prior to her return visit.     Follow up plan: Return in about 6 months (around 04/08/2024) for F/U with Pre-visit Labs, Meter/CGM/Logs, A1c here.  Ranny Earl, MD Phone: [phone]  Fax: [phone]  -  This note was partially dictated with voice recognition software. Similar sounding words can be transcribed inadequately or may not  be corrected upon review.  10/10/2023, 5:38 PM

## 2023-10-13 ENCOUNTER — Other Ambulatory Visit: Payer: Self-pay | Admitting: "Endocrinology

## 2023-10-17 ENCOUNTER — Other Ambulatory Visit: Payer: Self-pay | Admitting: "Endocrinology

## 2023-10-17 DIAGNOSIS — E1165 Type 2 diabetes mellitus with hyperglycemia: Secondary | ICD-10-CM

## 2023-10-19 ENCOUNTER — Telehealth: Payer: Self-pay | Admitting: "Endocrinology

## 2023-10-19 NOTE — Telephone Encounter (Signed)
 Spoke with [employer] representative, requested information given.

## 2023-10-19 NOTE — Telephone Encounter (Signed)
 [facility] is asking for a call back regarding the form that was sent to them [phone] ext [phone]

## 2023-11-02 ENCOUNTER — Ambulatory Visit (HOSPITAL_COMMUNITY)
Admission: RE | Admit: 2023-11-02 | Discharge: 2023-11-02 | Disposition: A | Discharge status: Home / Self Care | Source: Ambulatory Visit | Attending: Urology | Admitting: Urology

## 2023-11-02 ENCOUNTER — Ambulatory Visit: Admitting: Urology

## 2023-11-02 VITALS — BP 94/57 | HR 83

## 2023-11-02 DIAGNOSIS — N200 Calculus of kidney: Secondary | ICD-10-CM | POA: Insufficient documentation

## 2023-11-02 DIAGNOSIS — N201 Calculus of ureter: Secondary | ICD-10-CM

## 2023-11-02 LAB — URINALYSIS, ROUTINE W REFLEX MICROSCOPIC
Bilirubin, UA: NEGATIVE
Leukocytes,UA: NEGATIVE
Nitrite, UA: NEGATIVE
Specific Gravity, UA: 1.02 (ref 1.005–1.030)
Urobilinogen, Ur: 1 mg/dL (ref 0.2–1.0)
pH, UA: 6 (ref 5.0–7.5)

## 2023-11-02 LAB — MICROSCOPIC EXAMINATION: Epithelial Cells (non renal): >10 /HPF — AB (ref 0–10)

## 2023-11-02 MED ORDER — HYDROCODONE-ACETAMINOPHEN 5-325 MG PO TABS: 1.0000 | ORAL_TABLET | Freq: Four times a day (QID) | ORAL | 0 refills | Status: AC | PRN

## 2023-11-02 MED ORDER — TAMSULOSIN HCL 0.4 MG PO CAPS
0.4000 mg | ORAL_CAPSULE | Freq: Every day | ORAL | 1 refills | Status: AC
Start: 2023-11-02 — End: ?

## 2023-11-02 NOTE — Progress Notes (Unsigned)
 11/02/2023 2:09 PM   Rock Rebecca Roberson [birth year] [id number]  Referring provider: Tobie Guy, DO [address] [place],  [place] [address]  nephrolithiasis   HPI: [name] is a 73yo here for followup for nephrolithiasis. 2 weeks ago she developed right flank pain. She has a known right renal calculus. KUb from today does not show a renal calculus, but it does show a possible right mid ureteral calculus. She denies any significant LUTS. No hematuria or dysuria   PMH: Past Medical History:  Diagnosis Date   Arthritis    Cervical myelopathy (HCC) 06/30/2016   Diabetes mellitus without complication (HCC)    GERD (gastroesophageal reflux disease)    Heart murmur    History of kidney stones    has one now   Hyperlipidemia    Hypertension    Sleep apnea    cpap have not used 3 yrs    Surgical History: Past Surgical History:  Procedure Laterality Date   ANTERIOR CERVICAL DECOMP/DISCECTOMY FUSION N/A 07/30/2016   Procedure: ANTERIOR CERVICAL DECOMPRESSION/DISCECTOMY FUSION CERVICAL TWO-THREE;  Surgeon: Gillie Duncans, MD;  Location: [facility] OR;  Service: Neurosurgery;  Laterality: N/A;  ANTERIOR CERVICAL DECOMPRESSION/DISCECTOMY FUSION CERVICAL 2- CERVICAL 3   CERVICAL CONE BIOPSY      Home Medications:  Allergies as of 11/02/2023       Reactions   Metformin Diarrhea   Statins Other (See Comments)   Weak all over , muscle weakness    Sulfa Antibiotics Rash, Other (See Comments)   Gets a fever    Sulfasalazine Other (See Comments), Rash   Gets a fever         Medication List        Accurate as of November 02, 2023  2:09 PM. If you have any questions, ask your nurse or doctor.          amLODipine 5 MG tablet Commonly known as: NORVASC Take by mouth daily.   aspirin  EC 81 MG tablet Take 81 mg by mouth daily.   cyclobenzaprine 10 MG tablet Commonly known as: FLEXERIL Take 10 mg by mouth at bedtime as needed for muscle spasms.   empagliflozin  25 MG Tabs  tablet Commonly known as: Jardiance  Take 1 tablet (25 mg total) by mouth daily before breakfast.   fluticasone  50 MCG/ACT nasal spray Commonly known as: FLONASE  Place 2 sprays into both nostrils daily.   FreeStyle Libre 3 Plus Sensor Misc APPLY 1 PATCH TOPICALLY EVERY 15 DAYS   FreeStyle Libre 3 Reader Devi 1 Piece by Does not apply route once as needed for up to 1 dose.   gabapentin  300 MG capsule Commonly known as: NEURONTIN  Take 300 mg by mouth as needed.   ibuprofen  800 MG tablet Commonly known as: ADVIL  Take 1 tablet (800 mg total) by mouth every 8 (eight) hours as needed for moderate pain.   Insulin  Degludec FlexTouch 200 UNIT/ML Sopn Inject 80 Units into the skin at bedtime.   Insulin  Pen Needle 29G X Misc Use to inject insulin  nightly.   lisinopril -hydrochlorothiazide  20-25 MG tablet Commonly known as: ZESTORETIC  Take 1 tablet by mouth daily.   mirabegron  ER 50 MG Tb24 tablet Commonly known as: MYRBETRIQ  Take 1 tablet (50 mg total) by mouth daily.   omeprazole 20 MG capsule Commonly known as: PRILOSEC Take 20 mg by mouth daily.   OneTouch Verio test strip Generic drug: glucose blood USE TO CHECK BLOOD SUGAR TWICE DAILY.   PARoxetine  20 MG tablet Commonly known as:  PAXIL  Take 20 mg by mouth daily.   potassium chloride  10 MEQ tablet Commonly known as: KLOR-CON  M Take 10 mEq by mouth daily.   rosuvastatin  40 MG tablet Commonly known as: CRESTOR  Take 1 tablet (40 mg total) by mouth daily.   traMADol  50 MG tablet Commonly known as: ULTRAM  Take 2 tablets (100 mg total) by mouth every 6 (six) hours as needed for moderate pain.   Trulicity  4.5 MG/0.5ML Soaj Generic drug: Dulaglutide  INJECT 4.5 MG  SUBCUTANEOUSLY ONCE A WEEK   Vitamin D  (Ergocalciferol ) 1.25 MG (50000 UNIT) Caps capsule Commonly known as: DRISDOL  Take 50,000 Units by mouth every Monday.        Allergies:  Allergies  Allergen Reactions   Metformin Diarrhea   Statins Other  (See Comments)    Weak all over , muscle weakness    Sulfa Antibiotics Rash and Other (See Comments)    Gets a fever    Sulfasalazine Other (See Comments) and Rash    Gets a fever     Family History: Family History  Problem Relation Age of Onset   Kidney disease Father    Thyroid disease Father     Social History:  reports that she has never smoked. She has never used smokeless tobacco. She reports that she does not drink alcohol and does not use drugs.  ROS: All other review of systems were reviewed and are negative except what is noted above in HPI  Physical Exam: BP (!) 94/57   Pulse 83   Constitutional:  Alert and oriented, No acute distress. HEENT: [place] AT, moist mucus membranes.  Trachea midline, no masses. Cardiovascular: No clubbing, cyanosis, or edema. Respiratory: Normal respiratory effort, no increased work of breathing. GI: Abdomen is soft, nontender, nondistended, no abdominal masses GU: No CVA tenderness.  Lymph: No cervical or inguinal lymphadenopathy. Skin: No rashes, bruises or suspicious lesions. Neurologic: Grossly intact, no focal deficits, moving all 4 extremities. Psychiatric: Normal mood and affect.  Laboratory Data: Lab Results  Component Value Date   WBC 9.2 03/13/2022   HGB 13.1 03/13/2022   HCT 41.4 03/13/2022   MCV 87.5 03/13/2022   PLT 314 03/13/2022    Lab Results  Component Value Date   CREATININE 0.97 03/14/2023    No results found for: PSA  No results found for: TESTOSTERONE  Lab Results  Component Value Date   HGBA1C 7.7 (A) 04/05/2023    Urinalysis    Component Value Date/Time   APPEARANCEUR Clear 04/14/2023 1509   GLUCOSEU 3+ (A) 04/14/2023 1509   BILIRUBINUR Negative 04/14/2023 1509   PROTEINUR Negative 04/14/2023 1509   NITRITE Negative 04/14/2023 1509   LEUKOCYTESUR Negative 04/14/2023 1509    Lab Results  Component Value Date   LABMICR Comment 04/14/2023   WBCUA 0-5 12/10/2021   LABEPIT 0-10 12/10/2021    MUCUS Present (A) 12/10/2021   BACTERIA Few 12/10/2021    Pertinent Imaging: KUB today: Images reviewed and discussed with the patient  Results for orders placed in visit on 12/13/22  DG Abd 1 View  Narrative CLINICAL DATA:  Nephrolithiasis  EXAM: ABDOMEN - 1 VIEW  COMPARISON:  CT abdomen pelvis 03/13/2022  FINDINGS: 6 mm calcific density projecting over the expected location of the superior pole right kidney, compatible with nephrolithiasis. Stool throughout the colon. Nonobstructed bowel-gas pattern. Calcified fibroid projects over the pelvis. Lumbar spine degenerative changes.  IMPRESSION: 6 mm calcific density projecting over the expected location of the superior pole right kidney, compatible with nephrolithiasis.  Electronically Signed By: Bard Moats M.D. On: 03/27/2023 17:31  No results found for this or any previous visit.  No results found for this or any previous visit.  No results found for this or any previous visit.  No results found for this or any previous visit.  No results found for this or any previous visit.  No results found for this or any previous visit.  No results found for this or any previous visit.   Assessment & Plan:    1. Calculus, ureteral -We discussed the management of kidney stones. These options include observation, ureteroscopy, shockwave lithotripsy (ESWL) and percutaneous nephrolithotomy (PCNL). We discussed which options are relevant to the patient's stone(s). We discussed the natural history of kidney stones as well as the complications of untreated stones and the impact on quality of life without treatment as well as with each of the above listed treatments. We also discussed the efficacy of each treatment in its ability to clear the stone burden. With any of these management options I discussed the signs and symptoms of infection and the need for emergent treatment should these be experienced. For each option we  discussed the ability of each procedure to clear the patient of their stone burden.   For observation I described the risks which include but are not limited to silent renal damage, life-threatening infection, need for emergent surgery, failure to pass stone and pain.   For ureteroscopy I described the risks which include bleeding, infection, damage to contiguous structures, positioning injury, ureteral stricture, ureteral avulsion, ureteral injury, need for prolonged ureteral stent, inability to perform ureteroscopy, need for an interval procedure, inability to clear stone burden, stent discomfort/pain, heart attack, stroke, pulmonary embolus and the inherent risks with general anesthesia.   For shockwave lithotripsy I described the risks which include arrhythmia, kidney contusion, kidney hemorrhage, need for transfusion, pain, inability to adequately break up stone, inability to pass stone fragments, Steinstrasse, infection associated with obstructing stones, need for alternate surgical procedure, need for repeat shockwave lithotripsy, MI, CVA, PE and the inherent risks with anesthesia/conscious sedation.   For PCNL I described the risks including positioning injury, pneumothorax, hydrothorax, need for chest tube, inability to clear stone burden, renal laceration, arterial venous fistula or malformation, need for embolization of kidney, loss of kidney or renal function, need for repeat procedure, need for prolonged nephrostomy tube, ureteral avulsion, MI, CVA, PE and the inherent risks of general anesthesia.   - The patient would like to proceed with  - Urinalysis, Routine w reflex microscopic   No follow-ups on file.  Belvie Clara, MD  [facility] [place]

## 2023-11-02 NOTE — Patient Instructions (Signed)

## 2023-11-03 ENCOUNTER — Encounter: Payer: Self-pay | Admitting: Urology

## 2023-11-08 ENCOUNTER — Ambulatory Visit: Payer: Self-pay

## 2023-11-16 ENCOUNTER — Ambulatory Visit: Admitting: Urology

## 2023-11-19 ENCOUNTER — Other Ambulatory Visit: Payer: Self-pay | Admitting: "Endocrinology

## 2023-11-19 DIAGNOSIS — E1165 Type 2 diabetes mellitus with hyperglycemia: Secondary | ICD-10-CM

## 2023-11-22 ENCOUNTER — Telehealth: Payer: Self-pay | Admitting: "Endocrinology

## 2023-11-22 ENCOUNTER — Other Ambulatory Visit: Payer: Self-pay | Admitting: "Endocrinology

## 2023-11-22 DIAGNOSIS — E1165 Type 2 diabetes mellitus with hyperglycemia: Secondary | ICD-10-CM

## 2023-11-22 MED ORDER — TRULICITY 4.5 MG/0.5ML ~~LOC~~ SOAJ
4.5000 mg | SUBCUTANEOUS | 0 refills | Status: DC
Start: 2023-11-22 — End: 2023-12-21

## 2023-11-22 NOTE — Telephone Encounter (Signed)
 Called pt to give pt KUB results per MD McKenzie pt voiced her understanding but was unsure why we were just now giving her her results pt advised that MD wanted to make sure she had and understood her results pt advised if stones becomes bothersome please give us  a call

## 2023-11-22 NOTE — Telephone Encounter (Signed)
 Rx refill for Trulicity  4.5mg  weekly sent to [facility].

## 2023-11-22 NOTE — Telephone Encounter (Signed)
-----   Message from Belvie Clara sent at 11/22/2023  8:50 AM EDT ----- KUB shows right renal stone and no ureteral stones ----- Message ----- From: Sammie Exie HERO, CMA Sent: 11/08/2023   8:25 AM EDT To: Belvie LITTIE Clara, MD  Please review. Appt 10/22 ----- Message ----- From: Interface, Rad Results In Sent: 11/07/2023  11:38 PM EDT To: [facility]

## 2023-11-22 NOTE — Telephone Encounter (Signed)
 Pt is needing refill of Trulicity  sent to [facility] in [place]

## 2023-11-30 ENCOUNTER — Telehealth: Payer: Self-pay

## 2023-11-30 NOTE — Telephone Encounter (Signed)
 Pt return call. Pt state's she still have a kidney stone. Pt is made aware to continued taking flomax. Pt voiced understanding

## 2023-11-30 NOTE — Telephone Encounter (Signed)
 Tried calling pt with no answer, LVM for return call to office.

## 2023-11-30 NOTE — Telephone Encounter (Signed)
 Open in error

## 2023-11-30 NOTE — Telephone Encounter (Signed)
 Patient calling to check if she will need to continue taking tamsulosin (FLOMAX) 0.4 MG CAPS capsule?  Please advise.  Call: [phone]

## 2023-12-21 ENCOUNTER — Other Ambulatory Visit: Payer: Self-pay

## 2023-12-21 DIAGNOSIS — E1165 Type 2 diabetes mellitus with hyperglycemia: Secondary | ICD-10-CM

## 2023-12-21 MED ORDER — TRULICITY 4.5 MG/0.5ML ~~LOC~~ SOAJ: 4.5000 mg | SUBCUTANEOUS | 0 refills | Status: AC

## 2024-01-04 ENCOUNTER — Other Ambulatory Visit: Payer: Self-pay | Admitting: "Endocrinology

## 2024-01-04 DIAGNOSIS — E1165 Type 2 diabetes mellitus with hyperglycemia: Secondary | ICD-10-CM

## 2024-01-05 ENCOUNTER — Other Ambulatory Visit: Payer: Self-pay

## 2024-01-05 DIAGNOSIS — E1165 Type 2 diabetes mellitus with hyperglycemia: Secondary | ICD-10-CM

## 2024-02-17 ENCOUNTER — Other Ambulatory Visit: Payer: Self-pay | Admitting: "Endocrinology

## 2024-02-17 DIAGNOSIS — E1165 Type 2 diabetes mellitus with hyperglycemia: Secondary | ICD-10-CM

## 2024-04-09 ENCOUNTER — Ambulatory Visit: Admitting: "Endocrinology

## 2024-04-17 ENCOUNTER — Ambulatory Visit: Admitting: Urology
